# Patient Record
Sex: Female | Born: 1945
Health system: Southern US, Community
[De-identification: ages and names within clinical notes are randomized; demographics above are authoritative.]

## PROBLEM LIST (undated history)

## (undated) DIAGNOSIS — H269 Unspecified cataract: Secondary | ICD-10-CM

## (undated) DIAGNOSIS — Z872 Personal history of diseases of the skin and subcutaneous tissue: Secondary | ICD-10-CM

## (undated) DIAGNOSIS — M1990 Unspecified osteoarthritis, unspecified site: Secondary | ICD-10-CM

## (undated) DIAGNOSIS — Z201 Contact with and (suspected) exposure to tuberculosis: Secondary | ICD-10-CM

## (undated) DIAGNOSIS — G709 Myoneural disorder, unspecified: Secondary | ICD-10-CM

## (undated) DIAGNOSIS — K219 Gastro-esophageal reflux disease without esophagitis: Secondary | ICD-10-CM

## (undated) DIAGNOSIS — T7840XA Allergy, unspecified, initial encounter: Secondary | ICD-10-CM

## (undated) DIAGNOSIS — D649 Anemia, unspecified: Secondary | ICD-10-CM

## (undated) DIAGNOSIS — I10 Essential (primary) hypertension: Secondary | ICD-10-CM

## (undated) DIAGNOSIS — I499 Cardiac arrhythmia, unspecified: Secondary | ICD-10-CM

## (undated) HISTORY — PX: TUBAL LIGATION: SHX77

## (undated) HISTORY — PX: KELOID EXCISION: SHX1856

## (undated) HISTORY — PX: SHOULDER OPEN ROTATOR CUFF REPAIR: SHX2407

## (undated) HISTORY — DX: Contact with and (suspected) exposure to tuberculosis: Z20.1

## (undated) HISTORY — DX: Allergy, unspecified, initial encounter: T78.40XA

## (undated) HISTORY — PX: COLONOSCOPY W/ POLYPECTOMY: SHX1380

## (undated) HISTORY — PX: ABDOMINAL HYSTERECTOMY: SHX81

## (undated) HISTORY — PX: COLONOSCOPY: SHX174

## (undated) HISTORY — PX: KNEE ARTHROSCOPY: SUR90

## (undated) HISTORY — DX: Unspecified cataract: H26.9

## (undated) HISTORY — PX: KNEE SURGERY: SHX244

## (undated) HISTORY — PX: POLYPECTOMY: SHX149

## (undated) HISTORY — PX: APPENDECTOMY: SHX54

---

## 1998-02-26 ENCOUNTER — Other Ambulatory Visit: Admission: RE | Admit: 1998-02-26 | Discharge: 1998-02-26 | Payer: Self-pay | Admitting: Gastroenterology

## 1998-04-07 ENCOUNTER — Ambulatory Visit (HOSPITAL_COMMUNITY): Admission: RE | Admit: 1998-04-07 | Discharge: 1998-04-07 | Payer: Self-pay | Admitting: Gastroenterology

## 1998-04-07 ENCOUNTER — Encounter: Payer: Self-pay | Admitting: Gastroenterology

## 1999-03-17 ENCOUNTER — Encounter: Payer: Self-pay | Admitting: Gynecology

## 1999-03-17 ENCOUNTER — Encounter: Admission: RE | Admit: 1999-03-17 | Discharge: 1999-03-17 | Payer: Self-pay | Admitting: Gynecology

## 1999-04-17 ENCOUNTER — Other Ambulatory Visit: Admission: RE | Admit: 1999-04-17 | Discharge: 1999-04-17 | Payer: Self-pay | Admitting: Gynecology

## 2000-06-09 ENCOUNTER — Other Ambulatory Visit: Admission: RE | Admit: 2000-06-09 | Discharge: 2000-06-09 | Payer: Self-pay | Admitting: Gynecology

## 2000-06-13 ENCOUNTER — Encounter: Payer: Self-pay | Admitting: Gynecology

## 2000-06-13 ENCOUNTER — Encounter: Admission: RE | Admit: 2000-06-13 | Discharge: 2000-06-13 | Payer: Self-pay | Admitting: Gynecology

## 2000-07-07 ENCOUNTER — Emergency Department (HOSPITAL_COMMUNITY): Admission: EM | Admit: 2000-07-07 | Discharge: 2000-07-07 | Payer: Self-pay | Admitting: Emergency Medicine

## 2000-07-07 ENCOUNTER — Encounter: Payer: Self-pay | Admitting: Internal Medicine

## 2001-04-08 ENCOUNTER — Encounter: Payer: Self-pay | Admitting: Emergency Medicine

## 2001-04-08 ENCOUNTER — Emergency Department (HOSPITAL_COMMUNITY): Admission: EM | Admit: 2001-04-08 | Discharge: 2001-04-08 | Payer: Self-pay | Admitting: Emergency Medicine

## 2001-08-09 ENCOUNTER — Other Ambulatory Visit: Admission: RE | Admit: 2001-08-09 | Discharge: 2001-08-09 | Payer: Self-pay | Admitting: Gynecology

## 2001-10-05 ENCOUNTER — Emergency Department (HOSPITAL_COMMUNITY): Admission: EM | Admit: 2001-10-05 | Discharge: 2001-10-05 | Payer: Self-pay | Admitting: Emergency Medicine

## 2003-09-05 ENCOUNTER — Other Ambulatory Visit: Admission: RE | Admit: 2003-09-05 | Discharge: 2003-09-05 | Payer: Self-pay | Admitting: Gynecology

## 2003-10-11 ENCOUNTER — Encounter: Admission: RE | Admit: 2003-10-11 | Discharge: 2003-10-11 | Payer: Self-pay | Admitting: Gynecology

## 2004-09-21 ENCOUNTER — Ambulatory Visit: Payer: Self-pay | Admitting: Internal Medicine

## 2004-10-06 ENCOUNTER — Ambulatory Visit: Payer: Self-pay | Admitting: Internal Medicine

## 2004-10-23 ENCOUNTER — Ambulatory Visit: Payer: Self-pay | Admitting: Internal Medicine

## 2006-10-14 ENCOUNTER — Emergency Department (HOSPITAL_COMMUNITY): Admission: EM | Admit: 2006-10-14 | Discharge: 2006-10-14 | Payer: Self-pay | Admitting: Family Medicine

## 2006-10-19 ENCOUNTER — Ambulatory Visit: Payer: Self-pay | Admitting: Internal Medicine

## 2006-10-19 LAB — CONVERTED CEMR LAB
BUN: 9 mg/dL (ref 6–23)
CO2: 33 meq/L — ABNORMAL HIGH (ref 19–32)
Calcium: 10 mg/dL (ref 8.4–10.5)
Chloride: 101 meq/L (ref 96–112)
GFR calc non Af Amer: 108 mL/min
Glucose, Bld: 96 mg/dL (ref 70–99)
Potassium: 3.7 meq/L (ref 3.5–5.1)

## 2007-02-13 ENCOUNTER — Emergency Department (HOSPITAL_COMMUNITY): Admission: EM | Admit: 2007-02-13 | Discharge: 2007-02-13 | Payer: Self-pay | Admitting: Emergency Medicine

## 2007-02-14 ENCOUNTER — Telehealth: Payer: Self-pay | Admitting: Internal Medicine

## 2007-02-14 DIAGNOSIS — M25469 Effusion, unspecified knee: Secondary | ICD-10-CM

## 2007-03-10 ENCOUNTER — Emergency Department (HOSPITAL_COMMUNITY): Admission: EM | Admit: 2007-03-10 | Discharge: 2007-03-10 | Payer: Self-pay | Admitting: Emergency Medicine

## 2007-03-10 ENCOUNTER — Ambulatory Visit: Payer: Self-pay | Admitting: Surgery

## 2007-03-15 ENCOUNTER — Ambulatory Visit: Payer: Self-pay | Admitting: Internal Medicine

## 2007-03-15 DIAGNOSIS — I10 Essential (primary) hypertension: Secondary | ICD-10-CM

## 2007-04-13 ENCOUNTER — Encounter: Payer: Self-pay | Admitting: Internal Medicine

## 2008-01-26 ENCOUNTER — Emergency Department (HOSPITAL_COMMUNITY): Admission: EM | Admit: 2008-01-26 | Discharge: 2008-01-26 | Payer: Self-pay | Admitting: Emergency Medicine

## 2008-04-16 ENCOUNTER — Telehealth: Payer: Self-pay | Admitting: Internal Medicine

## 2008-06-18 ENCOUNTER — Telehealth: Payer: Self-pay | Admitting: Internal Medicine

## 2008-10-24 ENCOUNTER — Telehealth: Payer: Self-pay | Admitting: Internal Medicine

## 2009-09-13 ENCOUNTER — Emergency Department (HOSPITAL_COMMUNITY): Admission: EM | Admit: 2009-09-13 | Discharge: 2009-09-14 | Payer: Self-pay | Admitting: Emergency Medicine

## 2009-09-17 ENCOUNTER — Ambulatory Visit: Payer: Self-pay | Admitting: Internal Medicine

## 2009-09-17 LAB — CONVERTED CEMR LAB
AST: 17 units/L (ref 0–37)
Albumin: 3.7 g/dL (ref 3.5–5.2)
Basophils Relative: 0.4 % (ref 0.0–3.0)
CO2: 28 meq/L (ref 19–32)
Calcium: 9 mg/dL (ref 8.4–10.5)
Cholesterol: 184 mg/dL (ref 0–200)
Creatinine, Ser: 0.6 mg/dL (ref 0.4–1.2)
Eosinophils Relative: 2.1 % (ref 0.0–5.0)
Ketones, ur: NEGATIVE mg/dL
Lymphocytes Relative: 46.2 % — ABNORMAL HIGH (ref 12.0–46.0)
MCHC: 32.8 g/dL (ref 30.0–36.0)
MCV: 74.5 fL — ABNORMAL LOW (ref 78.0–100.0)
Neutrophils Relative %: 46.1 % (ref 43.0–77.0)
Nitrite: NEGATIVE
Platelets: 255 10*3/uL (ref 150.0–400.0)
RBC: 5.26 M/uL — ABNORMAL HIGH (ref 3.87–5.11)
RDW: 14.6 % (ref 11.5–14.6)
Sodium: 138 meq/L (ref 135–145)
TSH: 1.87 microintl units/mL (ref 0.35–5.50)
Total Bilirubin: 0.7 mg/dL (ref 0.3–1.2)
Total Protein, Urine: NEGATIVE mg/dL
Urobilinogen, UA: 0.2 (ref 0.0–1.0)
pH: 7 (ref 5.0–8.0)

## 2009-09-23 ENCOUNTER — Ambulatory Visit: Payer: Self-pay | Admitting: Internal Medicine

## 2009-09-23 DIAGNOSIS — Z8679 Personal history of other diseases of the circulatory system: Secondary | ICD-10-CM | POA: Insufficient documentation

## 2009-09-23 LAB — CONVERTED CEMR LAB: ds DNA Ab: <1 (ref <30)

## 2009-09-25 ENCOUNTER — Telehealth (INDEPENDENT_AMBULATORY_CARE_PROVIDER_SITE_OTHER): Payer: Self-pay | Admitting: *Deleted

## 2009-09-25 ENCOUNTER — Encounter: Payer: Self-pay | Admitting: Internal Medicine

## 2009-09-27 ENCOUNTER — Encounter: Payer: Self-pay | Admitting: Internal Medicine

## 2010-04-09 NOTE — Letter (Signed)
   [facility] [address] [place], [place]  [address] Phone: [phone]      September 27, 2009   MAKYNZIE DOBESH [address] [place], [place] [address]  RE:  LAB RESULTS  Dear  Ms. Kiernan,  The following is an interpretation of your most recent lab tests.  Please take note of any instructions provided or changes to medications that have resulted from your lab work.   Labs for lupus are negative for any active disease.    Sincerely Yours,    Jacques Navy MD  Patient: ALISON KUBICKI Note: All result statuses are Final unless otherwise noted.  Tests: (1) Anti Nuclear Antibody (ANA) Reflex (23900)  Anti Nuclear Antibody (ANA)                        [A]  POS                         NEGATIVE  Tests: (2) ANA Titer and Pattern ([address])   ANA Titer                 NEG                         <1:40           Reference Ranges:     1:40 - 1:80 Weakly positive, usually not clinically significant.     > or = to 1:160 Result may be clinically significant.     Due to differences in methodologies, results may differ between the     ANA screen and the Reflex IFA titer and pattern.   ANA Pattern               TNP           Pattern not applicable due to a negative titer result.  Tests: (3) Anti DNA, Native Dble Strand (13086)  Anti DNA, Native Dble Strand                             <1 IU/mL                    <30                  <  30 IU/mL     Negative                  30-40 IU/mL     Equivocal                  >  40 IU/mL     Positive

## 2010-04-09 NOTE — Letter (Signed)
Summary: Generic Letter  [facility]  [address] [place], [place] [address]   Phone: [phone]  Fax: [phone]    09/25/2009  Carolyn Fowler [address] [place], [place]  [address]  To Whom it may concern,     Ms. Kepple had a physical examination here in my office on September 23, 2009. I see no reason that she cannot participate in this program. All examinations checked out well with her physical, therefore it is ok for her to use the treadmill and other such equiptment to excercise. If you have any questions do not hesitate to call my office.        Sincerely,   Illene Regulus, MD

## 2010-04-09 NOTE — Progress Notes (Signed)
  Phone Note Call from Patient Call back at Home Phone [phone]   Caller: Patient Reason for Call: Talk to Doctor Summary of Call: Trying to participate in a weight loss program. Req md to write a note stating that she had her CPX 09/23/09 and everything was ok, and its ok for her to use treadmills ect. to exercise.  Initial call taken by: Orlan Leavens,  September 25, 2009 9:17 AM  Follow-up for Phone Call        if you prepare I will sign!!! THANKS Follow-up by: Jacques Navy MD,  September 25, 2009 9:57 AM  Additional Follow-up for Phone Call Additional follow up Details #1::        letter done and put up front, pt informed Additional Follow-up by: Ami Bullins CMA,  September 25, 2009 1:32 PM

## 2010-04-09 NOTE — Assessment & Plan Note (Signed)
Summary: PHYSICAL--STC   Vital Signs:  Patient profile:   65 year old female Height:      64 inches Weight:      257 pounds BMI:     44.27 O2 Sat:      97 % on Room air Temp:     97.9 degrees F oral Pulse rate:   78 / minute BP sitting:   150 / 98  (left arm) Cuff size:   large  Vitals Entered By: Bill Salinas CMA (September 23, 2009 1:43 PM)  O2 Flow:  Room air CC: CPX/ ab Comments Pt is due for Mammogram, and pap smear as well as Annual Eye exam   Primary Care Provider:  Norins  CC:  CPX/ ab.  History of Present Illness: Patient for routine medical follow-up.  1. She has regained all the weight she lost 2. She was at [facility] July 9th for palpitations. Labs included CBC-nl, Cardiac markers - nl, CMet  normal except for K = 3., D-dimer 0.61; CXR normal, CT brain normal, CT-angio chest -negative for PE or other abnormality. Her "fluttering stopped after K replacement.  3. She will have burning pain medial aspect of right heel/foot. 4. Concerned about lupus - 1 dtr with severe lupius, 1 dtr - with one "episode" of lupus, 1 grandchild with lupus, a second grandchild with lupus being "spotted." She will have splotchy rash on her face that is transient; bumps in her mouth.  5. health maintenance - due to see Dr. Nicholas Lose  Preventive Screening-Counseling & Management  Alcohol-Tobacco     Alcohol drinks/day: 0     Smoking Status: never  Current Medications (verified): 1)  Toprol Xl 100 Mg  Tb24 (Metoprolol Succinate) .... Take 1 Tablet By Mouth Once A Day 2)  Hydrochlorothiazide 25 Mg  Tabs (Hydrochlorothiazide) .... Take 1 Tablet By Mouth Once A Day 3)  Multivitamins   Tabs (Multiple Vitamin) .... Take 1 Tablet By Mouth Once A Day 4)  Vitamin E 400 Unit  Caps (Vitamin E) .... Take 1 Tablet By Mouth Once A Day 5)  Aleve 220 Mg  Tabs (Naproxen Sodium) .... As Needed 6)  Micardis 80 Mg Tabs (Telmisartan) .Marland Kitchen.. 1 By Mouth Once Daily 7)  Aspirin 81 Mg Tbec (Aspirin) .Marland Kitchen.. 1 Tab Two Times A  Day  Allergies (verified): No Known Drug Allergies  Past History:  Past Medical History: UCD UNSPECIFIED ESSENTIAL HYPERTENSION (ICD-401.9) JOINT EFFUSION, KNEE (ICD-719.06)  Past Surgical History: Tubal ligation Hysterectomy-secondary to fibroids Keloids removed left knee arthroscopy Rotator cuff repair - left   Family History: Father - deceased @60 : CAD/MI, PAD,  Mother - deceased @ 40's: Rheumatoid arthritis Neg- breast cancer, colon cancer; DM  Social History: Adopted Marriage -Master's degree vocational ed.  Married - '66- '79/divorced; married '04 2 sons - '67, '74; 3 daughters - '71, '74, '74; 15 grandchilderns; 3 great-grandchildren.Smoking Status:  never  Review of Systems       The patient complains of weight loss.  The patient denies anorexia, fever, weight gain, vision loss, decreased hearing, chest pain, dyspnea on exertion, peripheral edema, abdominal pain, severe indigestion/heartburn, muscle weakness, depression, and angioedema.    Physical Exam  General:  obese AA female in no distress Head:  Normocephalic and atraumatic without obvious abnormalities. No apparent alopecia or balding. Eyes:  vision grossly intact, pupils equal, pupils round, no injection, no optic disk abnormalities, and no retinal abnormalitiies.   Ears:  External ear exam shows no significant lesions or deformities.  Otoscopic examination reveals clear canals, tympanic membranes are intact bilaterally without bulging, retraction, inflammation or discharge. Hearing is grossly normal bilaterally. Nose:  no external deformity and no external erythema.   Mouth:  complex dental repair/parital denture, no buccal lesions, throat clear Neck:  supple, full ROM, no masses, no thyromegaly, and no carotid bruits.   Chest Wall:  No deformities, masses, or tenderness noted. Breasts:  deferred to gyn Lungs:  Normal respiratory effort, chest expands symmetrically. Lungs are clear to auscultation, no  crackles or wheezes. Heart:  Normal rate and regular rhythm. S1 and S2 normal without gallop, murmur, click, rub or other extra sounds. Abdomen:  Obese, soft, non-tender, normal bowel sounds, no masses, no guarding, and no inguinal hernia.   Msk:  normal ROM, no joint tenderness, no joint swelling, no joint warmth, no redness over joints, and no joint deformities.   Pulses:  2+ radial and DP pulses Extremities:  trace left pedal edema.   Neurologic:  alert & oriented X3, cranial nerves II-XII intact, strength normal in all extremities, sensation intact to light touch, gait normal, and DTRs symmetrical and normal.   Skin:  turgor normal, color normal, and no ulcerations.  No visible facial rash today Cervical Nodes:  no anterior cervical adenopathy and no posterior cervical adenopathy.   Psych:  Oriented X3, memory intact for recent and remote, normally interactive, and good eye contact.     Impression & Recommendations:  Problem # 1:  FACIAL RASH (ICD-782.1) Strong family h/o SLE. Patient has had a transient facila rash. She has a high level of concern as to whether she may have lupus.  Plan - ANA, dsDNA  Orders: T-Antinuclear Antib (ANA) [phone]) T-dsDNA Assay [phone])  Problem # 2:  UNSPECIFIED ESSENTIAL HYPERTENSION (ICD-401.9)  Her updated medication list for this problem includes:    Metoprolol Tartrate 50 Mg Tabs (Metoprolol tartrate) .Marland Kitchen... 1 by mouth two times a day    Hydrochlorothiazide 25 Mg Tabs (Hydrochlorothiazide) .Marland Kitchen... Take 1 tablet by mouth once a day    Losartan Potassium 50 Mg Tabs (Losartan potassium) .Marland Kitchen... 1 by mouth once daily for blood pressure  BP today: 150/98 Prior BP: 169/95 (03/15/2007)  Suboptimal control.     Plan -  renewed all hermedications after making generic substitutions.             Patient to check BPO at home and to return in 4-6 weeks for BP check                   Problem # 3:  PALPITATIONS, HX OF (ICD-V12.50) Assessment:  Unchanged ED records reviewed. Patient reports that he symptoms resolved  with K+ replacement. Her evaluation was complete and no further testing indecated at this time.   Problem # 4:  Preventive Health Care (ICD-V70.0) History as noted. Her exam was remarkable for her obesity but otherwise normal. Gyn and breasat exam deferred to gynecology. Her lab resultgs are within normal limits. She is current with mammography by her report. Will need to pull paper chart to check on last colonoscopy. Brought up-to-date for tetnus. She is a candidate for pneumonia vaccine adn zostavax.   In summary - a very nice woman. We discussed a plan for weight management: smart food choices, portion size control and regular exercise. Her target is to loose 50 lbs, her goal is to loose 1 lb/month = 12 lbs/year.  She will need to return in 4-6 weeks for BP check.   Complete Medication List:  1)  Metoprolol Tartrate 50 Mg Tabs (Metoprolol tartrate) .Marland Kitchen.. 1 by mouth two times a day 2)  Hydrochlorothiazide 25 Mg Tabs (Hydrochlorothiazide) .... Take 1 tablet by mouth once a day 3)  Multivitamins Tabs (Multiple vitamin) .... Take 1 tablet by mouth once a day 4)  Vitamin E 400 Unit Caps (Vitamin e) .... Take 1 tablet by mouth once a day 5)  Aleve 220 Mg Tabs (Naproxen sodium) .... As needed 6)  Losartan Potassium 50 Mg Tabs (Losartan potassium) .Marland Kitchen.. 1 by mouth once daily for blood pressure 7)  Aspirin 81 Mg Tbec (Aspirin) .Marland Kitchen.. 1 tab two times a day  Other Orders: Tdap => 87yrs IM ([address]) Admin 1st Vaccine ([address])  Patient: Carolyn Fowler Note: All result statuses are Final unless otherwise noted.  Tests: (1) BMP (METABOL)   Sodium                    138 mEq/L                   135-145   Potassium                 4.1 mEq/L                   3.5-5.1   Chloride                  104 mEq/L                   96-112   Carbon Dioxide            28 mEq/L                    19-32   Glucose                   93 mg/dL                     30-86   BUN                       16 mg/dL                    5-78   Creatinine                0.6 mg/dL                   4.6-9.6   Calcium                   9.0 mg/dL                   2.9-52.8   GFR                       127.39 mL/min               >60  Tests: (2) Lipid Panel (LIPID)   Cholesterol               184 mg/dL                   4-132     ATP III Classification            Desirable:  < 200 mg/dL  Borderline High:  200 - 239 mg/dL               High:  > = 240 mg/dL   Triglycerides             106.0 mg/dL                 9.1-478.2     Normal:  <150 mg/dL     Borderline High:  956 - 199 mg/dL   HDL                       21.30 mg/dL                 >86.57   VLDL Cholesterol          21.2 mg/dL                  8.4-69.6   LDL Cholesterol      [H]  295 mg/dL                   2-84  CHO/HDL Ratio:  CHD Risk                             4                    Men          Women     1/2 Average Risk     3.4          3.3     Average Risk          5.0          4.4     2X Average Risk          9.6          7.1     3X Average Risk          15.0          11.0                           Tests: (3) CBC Platelet w/Diff (CBCD)   White Cell Count          7.7 K/uL                    4.5-10.5   Red Cell Count       [H]  5.26 Mil/uL                 3.87-5.11   Hemoglobin                12.8 g/dL                   13.2-44.0   Hematocrit                39.2 %                      36.0-46.0   MCV                  [L]  74.5 fl                     78.0-100.0   MCHC  32.8 g/dL                   16.1-09.6   RDW                       14.6 %                      11.5-14.6   Platelet Count            255.0 K/uL                  150.0-400.0   Neutrophil %              46.1 %                      43.0-77.0   Lymphocyte %         [H]  46.2 %                      12.0-46.0   Monocyte %                5.2 %                       3.0-12.0   Eosinophils%               2.1 %                       0.0-5.0   Basophils %               0.4 %                       0.0-3.0   Neutrophill Absolute      3.6 K/uL                    1.4-7.7   Lymphocyte Absolute       3.6 K/uL                    0.7-4.0   Monocyte Absolute         0.4 K/uL                    0.1-1.0  Eosinophils, Absolute                             0.2 K/uL                    0.0-0.7   Basophils Absolute        0.0 K/uL                    0.0-0.1  Tests: (4) Hepatic/Liver Function Panel (HEPATIC)   Total Bilirubin           0.7 mg/dL                   0.4-5.4   Direct Bilirubin          0.1 mg/dL                   0.9-8.1   Alkaline Phosphatase      68 U/L  39-117   AST                       17 U/L                      0-37   ALT                       17 U/L                      0-35   Total Protein             6.6 g/dL                    1.6-1.0   Albumin                   3.7 g/dL                    9.6-0.4  Tests: (5) TSH (TSH)   FastTSH                   1.87 uIU/mL                 0.35-5.50  Tests: (6) UDip w/Micro (URINE)   Color                     LT. YELLOW       RANGE:  Yellow;Lt. Yellow     small amorphous present   Clarity                   CLEAR                       Clear   Specific Gravity          1.020                       1.000 - 1.030   Urine Ph                  7.0                         5.0-8.0   Protein                   NEGATIVE                    Negative   Urine Glucose             NEGATIVE                    Negative   Ketones                   NEGATIVE                    Negative   Urine Bilirubin           NEGATIVE                    Negative   Blood                     NEGATIVE  Negative   Urobilinogen              0.2                         0.0 - 1.0   Leukocyte Esterace        SMALL                       Negative   Nitrite                   NEGATIVE                    Negative   Urine WBC                  3-6/hpf                     0-2/hpf   Urine Epith               Few(5-10/hpf)               Rare(0-4/hpf)   Urine Bacteria            Few(10-50/hpf)              NonePrescriptions: LOSARTAN POTASSIUM 50 MG TABS (LOSARTAN POTASSIUM) 1 by mouth once daily for blood pressure  #90 x 3   Entered and Authorized by:   Jacques Navy MD   Signed by:   Jacques Navy MD on 09/23/2009   Method used:   Electronically to        [facility] [phone]* (retail)       [address]       [place], [place]  [address]       Ph: [phone]       Fax: [phone]   RxID:   [id number] METOPROLOL TARTRATE 50 MG TABS (METOPROLOL TARTRATE) 1 by mouth two times a day  #180 x 3   Entered and Authorized by:   Jacques Navy MD   Signed by:   Jacques Navy MD on 09/23/2009   Method used:   Electronically to        [facility] [phone]* (retail)       [address]       [place], [place]  [address]       Ph: [phone]       Fax: [phone]   RxID:   [id number]    Immunizations Administered:  Tetanus Vaccine:    Vaccine Type: Tdap    Site: right deltoid    Mfr: GlaxoSmithKline    Dose: 0.5 ml    Route: IM    Given by: Ami Bullins CMA    Exp. Date: 05/30/2011    Lot #: [id number]    VIS given: 01/24/07 version given September 23, 2009.

## 2010-05-24 LAB — POCT CARDIAC MARKERS: CKMB, poc: 2 ng/mL (ref 1.0–8.0)

## 2010-05-24 LAB — DIFFERENTIAL
Basophils Absolute: 0.1 10*3/uL (ref 0.0–0.1)
Eosinophils Relative: 2 % (ref 0–5)
Lymphocytes Relative: 36 % (ref 12–46)
Monocytes Relative: 8 % (ref 3–12)

## 2010-05-24 LAB — CBC
HCT: 40 % (ref 36.0–46.0)
Hemoglobin: 13.3 g/dL (ref 12.0–15.0)
MCH: 24.8 pg — ABNORMAL LOW (ref 26.0–34.0)
MCV: 74.4 fL — ABNORMAL LOW (ref 78.0–100.0)
Platelets: 234 10*3/uL (ref 150–400)
RDW: 13.3 % (ref 11.5–15.5)

## 2010-05-24 LAB — TSH: TSH: 1.608 u[IU]/mL (ref 0.350–4.500)

## 2010-05-24 LAB — POCT I-STAT, CHEM 8
BUN: 16 mg/dL (ref 6–23)
Chloride: 103 mEq/L (ref 96–112)
Glucose, Bld: 103 mg/dL — ABNORMAL HIGH (ref 70–99)
HCT: 43 % (ref 36.0–46.0)

## 2010-06-29 ENCOUNTER — Other Ambulatory Visit: Payer: Self-pay | Admitting: Internal Medicine

## 2010-10-12 ENCOUNTER — Other Ambulatory Visit: Payer: Self-pay | Admitting: Internal Medicine

## 2010-12-17 ENCOUNTER — Other Ambulatory Visit: Payer: Self-pay | Admitting: Internal Medicine

## 2011-02-10 ENCOUNTER — Other Ambulatory Visit: Payer: Self-pay | Admitting: Internal Medicine

## 2011-03-04 ENCOUNTER — Other Ambulatory Visit: Payer: Self-pay | Admitting: *Deleted

## 2011-03-04 MED ORDER — HYDROCHLOROTHIAZIDE 25 MG PO TABS: 25.0000 mg | ORAL_TABLET | Freq: Every day | ORAL | Status: DC

## 2011-05-03 ENCOUNTER — Other Ambulatory Visit: Payer: Self-pay | Admitting: Gynecology

## 2011-05-03 DIAGNOSIS — N6320 Unspecified lump in the left breast, unspecified quadrant: Secondary | ICD-10-CM

## 2011-05-03 DIAGNOSIS — N644 Mastodynia: Secondary | ICD-10-CM

## 2011-05-11 ENCOUNTER — Ambulatory Visit
Admission: RE | Admit: 2011-05-11 | Discharge: 2011-05-11 | Disposition: A | Discharge status: Home / Self Care | Payer: Self-pay | Source: Ambulatory Visit | Attending: Gynecology | Admitting: Gynecology

## 2011-05-11 DIAGNOSIS — N6320 Unspecified lump in the left breast, unspecified quadrant: Secondary | ICD-10-CM

## 2011-05-11 DIAGNOSIS — N644 Mastodynia: Secondary | ICD-10-CM

## 2011-06-11 ENCOUNTER — Other Ambulatory Visit: Payer: Self-pay | Admitting: Orthopedic Surgery

## 2011-06-11 DIAGNOSIS — M4310 Spondylolisthesis, site unspecified: Secondary | ICD-10-CM

## 2011-06-18 ENCOUNTER — Ambulatory Visit
Admission: RE | Admit: 2011-06-18 | Discharge: 2011-06-18 | Disposition: A | Discharge status: Home / Self Care | Payer: BC Managed Care – PPO | Source: Ambulatory Visit | Attending: Orthopedic Surgery | Admitting: Orthopedic Surgery

## 2011-06-18 VITALS — BP 136/85 | HR 71

## 2011-06-18 DIAGNOSIS — M4310 Spondylolisthesis, site unspecified: Secondary | ICD-10-CM

## 2011-06-18 MED ORDER — DIAZEPAM 5 MG PO TABS
10.0000 mg | ORAL_TABLET | Freq: Once | ORAL | Status: AC
  Administered 2011-06-18: 10 mg via ORAL

## 2011-06-18 MED ORDER — IOHEXOL 180 MG/ML  SOLN
15.0000 mL | Freq: Once | INTRAMUSCULAR | Status: AC | PRN
  Administered 2011-06-18: 15 mL via INTRATHECAL

## 2011-06-18 NOTE — Discharge Instructions (Signed)

## 2011-07-01 ENCOUNTER — Encounter: Payer: Self-pay | Admitting: Internal Medicine

## 2011-07-01 ENCOUNTER — Other Ambulatory Visit (INDEPENDENT_AMBULATORY_CARE_PROVIDER_SITE_OTHER): Payer: BC Managed Care – PPO

## 2011-07-01 ENCOUNTER — Ambulatory Visit (INDEPENDENT_AMBULATORY_CARE_PROVIDER_SITE_OTHER): Payer: BC Managed Care – PPO | Admitting: Internal Medicine

## 2011-07-01 VITALS — BP 140/100 | HR 78 | Temp 98.2°F | Resp 16 | Wt 251.0 lb

## 2011-07-01 DIAGNOSIS — I10 Essential (primary) hypertension: Secondary | ICD-10-CM

## 2011-07-01 DIAGNOSIS — M5431 Sciatica, right side: Secondary | ICD-10-CM

## 2011-07-01 DIAGNOSIS — M5430 Sciatica, unspecified side: Secondary | ICD-10-CM

## 2011-07-01 DIAGNOSIS — Z0000 Encounter for general adult medical examination without abnormal findings: Secondary | ICD-10-CM

## 2011-07-01 DIAGNOSIS — Z23 Encounter for immunization: Secondary | ICD-10-CM

## 2011-07-01 LAB — COMPREHENSIVE METABOLIC PANEL
ALT: 18 U/L (ref 0–35)
BUN: 13 mg/dL (ref 6–23)
CO2: 29 mEq/L (ref 19–32)
Creatinine, Ser: 0.7 mg/dL (ref 0.4–1.2)
GFR: 117.72 mL/min (ref >60.00)
Glucose, Bld: 97 mg/dL (ref 70–99)
Total Bilirubin: 0.7 mg/dL (ref 0.3–1.2)

## 2011-07-01 MED ORDER — HYDROCHLOROTHIAZIDE 25 MG PO TABS: 25.0000 mg | ORAL_TABLET | Freq: Every day | ORAL | Status: DC

## 2011-07-01 MED ORDER — LOSARTAN POTASSIUM 100 MG PO TABS: 100.0000 mg | ORAL_TABLET | Freq: Every day | ORAL | Status: DC

## 2011-07-01 MED ORDER — METOPROLOL TARTRATE 50 MG PO TABS: 50.0000 mg | ORAL_TABLET | Freq: Two times a day (BID) | ORAL | Status: DC

## 2011-07-01 NOTE — Patient Instructions (Signed)
Will follow with you on the back problem.  Have Dr. Jamey Ripa send me a copy of his evaluation.  We gave you pneumonia vaccine today. Please check with your insurance company about coverage for shingles vaccine.  When you can you need to have an eye exam and to see Dr. Jarold Motto for colonoscopy

## 2011-07-01 NOTE — Progress Notes (Signed)
Subjective:    Patient ID: Carolyn Fowler, female    DOB: Apr 18, 1945, 66 y.o.   MRN: [id number]  HPI Carolyn Fowler presents for an annual exam. She has been seen by Dr. Nicholas Lose - normal pelvic and pap smear. She did have a left anterior axillary mass on exam. Mammography with u/s follow-up was negative for any abnormality on the imaging studies. Dr. Nicholas Lose has referred her to Dr. Jamey Ripa for surgical evaluation.  She has been having bilateral  Sciatica but worst on the right . She is seeing Dr. [name] - she had an abnormal MRI - study not accessible. She did have CT myelogram 06/18/11 with anterolisthesis L4-5, L5-S1; she has foraminal narrowing with compromise of the L5, S1 nerve roots bilaterally R>L and also DDD with foraminal narrowing at L4. She will be returning to Dr. [name] to review her study next week. She did have a course of prednisone which gave some relief but pain came back full force. She is not taking any analgesics and does take occasional NSAID.  BP - she has been taking losartan, metoprolol and HCTZ. BP today is 140/100  She has otherwise been OK.  No past medical history on file. Past Surgical History  Procedure Date  . Abdominal hysterectomy    Family History  Problem Relation Age of Onset  . Adopted: Yes   History   Social History  . Marital Status: Married    Spouse Name: N/A    Number of Children: 5  . Years of Education: 18   Occupational History  . vocational counselor     retired   Social History Main Topics  . Smoking status: Never Smoker   . Smokeless tobacco: Never Used  . Alcohol Use: No  . Drug Use: No  . Sexually Active: No   Other Topics Concern  . Not on file   Social History Narrative   [employer], [employer] BA, Master's degree vocational ed. Married - '77- '79/divorced; married '042 sons - '67, '74; 3 daughters - '71, '74, '74; 15 grandchilderns; 3 great-grandchildren. Work - retired from Radiation protection practitioner. Marriage in good health.      Current Outpatient Prescriptions on File Prior to Visit  Medication Sig Dispense Refill  . DISCONTD: hydrochlorothiazide (HYDRODIURIL) 25 MG tablet Take 1 tablet (25 mg total) by mouth daily.  90 tablet  2  . DISCONTD: losartan (COZAAR) 50 MG tablet TAKE ONE TABLET BY MOUTH EVERY DAY FOR BLOOD PRESSURE  90 tablet  3  . DISCONTD: metoprolol (LOPRESSOR) 50 MG tablet TAKE ONE TABLET BY MOUTH TWICE DAILY   60 tablet  2      Review of Systems Constitutional:  Negative for fever, chills, activity change and unexpected weight change.  HEENT:  Negative for hearing loss, ear pain, congestion, neck stiffness and postnasal drip. Negative for sore throat or swallowing problems. Negative for dental complaints.   Eyes: Negative for vision loss or change in visual acuity.  Respiratory: Negative for chest tightness and wheezing. Negative for DOE.   Cardiovascular: Negative for chest pain or palpitations. No decreased exercise tolerance Gastrointestinal: No change in bowel habit. No bloating or gas. No reflux or indigestion Genitourinary: Negative for urgency, frequency, flank pain and difficulty urinating.  Musculoskeletal: Negative for myalgias, back pain, arthralgias and gait problem.  Neurological: Negative for dizziness, tremors, weakness and headaches.  Hematological: Negative for adenopathy.  Psychiatric/Behavioral: Negative for behavioral problems and dysphoric mood.       Objective:   Physical Exam Filed  Vitals:   07/01/11 1439  BP: 140/100  Pulse: 78  Temp: 98.2 F (36.8 C)  Resp: 16   Wt Readings from Last 3 Encounters:  07/01/11 251 lb (113.853 kg)  09/23/09 257 lb (116.574 kg)   Gen'l- obese AA woman in no distress HEENT- [place]/AT, C&S clear, PERRLA Neck- supple, no thyromegaly Nodes - negative Cor- 2+ radial, DP pulses, No JVD, no carotid bruits, RRR w/o murmur Pulm - normal respirations, lungs CTAP Breast exam - deferred to gyn Abd- obese, soft Pelvic/rectal deferred to  gyn Ext- no deformity, normal ROM small, medium and large joints Neuro - A&O x e, CN II-XII normal, MS normal, cerebellar function - normal Derm- no lesions face, neck, UE.  Lab Results  Component Value Date   WBC 7.7 09/17/2009   HGB 12.8 09/17/2009   HCT 39.2 09/17/2009   PLT 255.0 09/17/2009   GLUCOSE 97 07/01/2011   CHOL 184 09/17/2009   TRIG 106.0 09/17/2009   HDL 49.40 09/17/2009   LDLCALC 113* 09/17/2009   ALT 18 07/01/2011   AST 17 07/01/2011   NA 140 07/01/2011   K 3.5 07/01/2011   CL 101 07/01/2011   CREATININE 0.7 07/01/2011   BUN 13 07/01/2011   CO2 29 07/01/2011   TSH 1.87 09/17/2009          Assessment & Plan:

## 2011-07-05 ENCOUNTER — Encounter: Payer: Self-pay | Admitting: Internal Medicine

## 2011-07-05 DIAGNOSIS — Z0000 Encounter for general adult medical examination without abnormal findings: Secondary | ICD-10-CM | POA: Insufficient documentation

## 2011-07-05 DIAGNOSIS — M5432 Sciatica, left side: Secondary | ICD-10-CM | POA: Insufficient documentation

## 2011-07-05 NOTE — Assessment & Plan Note (Signed)
Carolyn Fowler is having considerable pain but is able to ambulate and manage her ADLs. She will be seeing Dr. [name] in the near future to review myelogram and plan next step in her treatment.

## 2011-07-05 NOTE — Assessment & Plan Note (Addendum)
BP Readings from Last 3 Encounters:  07/01/11 140/100  06/18/11 136/85  09/23/09 150/98   Mild elevation.   Plan- monitor at home. If systolic is consistently 140+ or diastolic 90+ will need to modify regimen.

## 2011-07-05 NOTE — Assessment & Plan Note (Signed)
Interval medical history is negative for any new problems. Physical, sans pelvic and breast exam, is normal but notable for obesity. She is current with Gyn. She is due for colonoscopy and will contact Dr. Jarold Motto at her convenience. She is current with mamography. Immunizations are current but she is due Shingles vaccine - she will check her insurance coverage.   In summary - a very nice woman who is facing intervention for DDD/DJD with probable next step being ESI. She is medically stable for surgical procedures and anesthesia if needed. She will return as needed on in 1 year.

## 2011-07-27 ENCOUNTER — Encounter (INDEPENDENT_AMBULATORY_CARE_PROVIDER_SITE_OTHER): Payer: Self-pay | Admitting: Surgery

## 2011-07-27 ENCOUNTER — Ambulatory Visit (INDEPENDENT_AMBULATORY_CARE_PROVIDER_SITE_OTHER): Payer: BC Managed Care – PPO | Admitting: Surgery

## 2011-07-27 VITALS — BP 146/92 | HR 84 | Temp 97.0°F | Resp 16 | Ht 63.5 in | Wt 252.0 lb

## 2011-07-27 DIAGNOSIS — N6019 Diffuse cystic mastopathy of unspecified breast: Secondary | ICD-10-CM | POA: Insufficient documentation

## 2011-07-27 NOTE — Patient Instructions (Signed)
See me again in three months for a breast re-eamination

## 2011-07-27 NOTE — Progress Notes (Signed)
  CC: Possible breast mass HPI: This patient comes over on referral from her gynecologist for evaluation of a left breast nodular density. The patient first noticed it this past fall so than presently six months. She has some pain. She's had a mammogram and ultrasound of the area several months ago there were negative for mass. However the area remained palpably abnormal and we're S. to see her. She's had no other prior breast problems or symptoms. She doesn't have known family history of breast cancer but she is adopted and has incomplete palate or her family history.   ROS: Her review of systems has been positive for palpitations and easy bruising.  MEDS: Current Outpatient Prescriptions  Medication Sig Dispense Refill  . hydrochlorothiazide (HYDRODIURIL) 25 MG tablet Take 1 tablet (25 mg total) by mouth daily.  90 tablet  3  . losartan (COZAAR) 100 MG tablet Take 1 tablet (100 mg total) by mouth daily.  90 tablet  3  . metoprolol (LOPRESSOR) 50 MG tablet Take 1 tablet (50 mg total) by mouth 2 (two) times daily.  180 tablet  3  . HYDROcodone-acetaminophen (NORCO) 5-325 MG per tablet Ad lib.         ALLERGIES:  Allergies  Allergen Reactions  . Penicillins Itching, Swelling and Rash    Itched inside and out     PE Vital signs:BP 146/92  Pulse 84  Temp(Src) 97 F (36.1 C) (Temporal)  Resp 16  Ht 5' 3.5" (1.613 m)  Wt 252 lb (114.306 kg)  BMI 43.94 kg/m2  Gen.: The patient alert awake oriented and healthy-appearing. Breasts: The breasts are very large and pendulous. The right breast is completely normal and nontender. The left breast is basically normal but with her lying supine and the breast retracted medially there is a palpable density in the upper outer quadrant towards the axilla about the one or 2:00 position. This area is tender. It is not really a discrete mass but feels more like some fibrotic or fibrocystic residual breast tissue.  Data Reviewed I have reviewed the  notes from her gynecologist.  Assessment Fibrocystic changes with some nodularity.  Plan We discussed alternatives of excisional biopsy versus followup I'm in favor of followup since I think this is going to be benign. She'll come back to see me in three months.

## 2011-10-07 HISTORY — PX: BACK SURGERY: SHX140

## 2011-10-13 ENCOUNTER — Encounter (HOSPITAL_COMMUNITY): Payer: Self-pay | Admitting: Pharmacy Technician

## 2011-10-18 ENCOUNTER — Other Ambulatory Visit: Payer: Self-pay | Admitting: Internal Medicine

## 2011-10-19 NOTE — H&P (Signed)
Carolyn Fowler DOB: [birth year]  Chief Complaint: back pain with radicular right leg pain  History of Present Illness The patient is a 66 year old female who comes in today for a preoperative History and Physical. The patient is scheduled for a central lumbar decompression with microdiscectomy L3-L4 with possible L4-L5 involvement. to be performed by Dr. Georges Lynch. Carolyn Hillock, MD at [facility] on Wednesday October 27, 2011 . She has been seen by Dr. Darrelyn Fowler for her back and leg pain for several months. CT myelogram showed she has a herniated disc at L3-4 and it's central. She has a stenosis of 3-4 adn L4-L5 as well. Carolyn Fowler had an ESI by Dr. Ethelene Hal. She had relief for about a day but about 48 hours after the injection, she started to have the right leg pain and cramps that she had previously. She reports that she gets severe cramps about 3 times a day but throughout the day she does have some constant pain, as well. No particular activity seems to bring this on. It can happen with sitting or standing. She does have some numbness and tingling in the right foot. She has noticed that, at times, due to the pain in her right leg and back, she is having some trouble with balance and walking. She needs to have a central decompressive lumbar laminectomy and microdiscectomy at L3-4. Risks and benefits of the surgery discussed.    Problem List/Past MedicalHistory Lumbar disc herniation (722.10) Spondylolisthesis, Acquired (738.4) Gastroesophageal Reflux Disease Hypertension Tuberculosis, Contact With/Exposure To. 1992   Allergies PENICILLIN.    Family History Patient is adopted and has limited knowledge of family medical history Heart Disease. father Rheumatoid Arthritis. mother Lupus Erythematosus, Systemic. Child. Brain Tumor. nephew   Social History Exercise. Exercises weekly Illicit drug use. no Marital status. married Current work status.  retired Tobacco use. never smoker Living situation. live with spouse Children. 5 or more Alcohol use. never consumed alcohol Post-Surgical Plans. home with family Advance Directives. none   Medication History Hydrochlorothiazide (25MG  Tablet, Oral daily) Active. Losartan Potassium (50MG  Tablet, Oral daily) Active. Metoprolol Tartrate (50MG  Tablet, Oral two times daily) Active. Multiple Vitamins/Womens (1 Oral daily) Active. Aspirin EC (81MG  Tablet DR, Oral two times daily) Active. Norco 5-325 MG PRN pain Q 4-6 hours    Past Surgical History Tubal Ligation Hysterectomy. partial (cancerous) Arthroscopy of Knee. left Colon Polyp Removal - Colonoscopy Rotator Cuff Repair - Left Appendectomy    Review of Systems General:Not Present- Chills, Fever, Night Sweats, Fatigue, Weight Gain, Weight Loss and Memory Loss. Skin:Not Present- Hives, Itching, Rash, Eczema and Lesions. HEENT:Present- Dentures. Not Present- Tinnitus, Headache, Double Vision, Visual Loss and Hearing Loss. Respiratory:Not Present- Shortness of breath with exertion, Shortness of breath at rest, Allergies, Coughing up blood and Chronic Cough. Cardiovascular:Present- Palpitations. Not Present- Chest Pain, Racing/skipping heartbeats, Difficulty Breathing Lying Down, Murmur and Swelling. Gastrointestinal:Not Present- Bloody Stool, Heartburn, Abdominal Pain, Vomiting, Nausea, Constipation, Diarrhea, Difficulty Swallowing, Jaundice and Loss of appetitie. Female Genitourinary:Not Present- Blood in Urine, Urinary frequency, Weak urinary stream, Discharge, Flank Pain, Incontinence, Painful Urination, Urgency, Urinary Retention and Urinating at Night. Musculoskeletal:Present- Muscle Weakness, Muscle Pain, Joint Swelling, Joint Pain, Back Pain, Morning Stiffness and Spasms. Neurological:Present- Difficulty with balance and Weakness. Not Present- Tremor, Dizziness, Blackout spells and  Paralysis. Psychiatric:Not Present- Insomnia.   Vitals Weight: 252 lb Height: 64 in Body Surface Area: 2.27 m Body Mass Index: 43.26 kg/m Pulse: 80 (Regular) Resp.: 17 (Unlabored) BP: 158/96 (Sitting, Left Arm, Standard) Patient  has been out of Losartan BP med for 2 days    Physical Exam General Mental Status - Alert, cooperative and good historian. General Appearance- pleasant. Not in acute distress. Orientation- Oriented X3. Build & Nutrition- Overweight, Well nourished and Well developed. Head and Neck Head- normocephalic, atraumatic . Neck Global Assessment- supple. no bruit auscultated on the right and no bruit auscultated on the left. Eye Pupil- Bilateral- Regular and Round. Motion- Bilateral- EOMI. Chest and Lung Exam Auscultation: Breath sounds:- clear at anterior chest wall and - clear at posterior chest wall. Adventitious sounds:- No Adventitious sounds. Cardiovascular Auscultation:Rhythm- Regular rate and rhythm. Heart Sounds- S1 WNL and S2 WNL. Murmurs & Other Heart Sounds:Auscultation of the heart reveals - No Murmurs. Abdomen Inspection:Contour- Obese. Palpation/Percussion:Tenderness- Abdomen is non-tender to palpation. Rigidity (guarding)- Abdomen is soft. Auscultation:Auscultation of the abdomen reveals - Bowel sounds normal. Female Genitourinary Not done, not pertinent to present illness Peripheral Vascular Upper Extremity: Palpation:- Pulses bilaterally normal. Lower Extremity: Palpation:- Pulses bilaterally normal. Neurologic Neurologic evaluation reveals - normal sensation and upper and lower extremity deep tendon reflexes intact bilaterally . Musculoskeletal No tenderness over the lumbar spine. She does have some tenderness over the SI joint on the right side. No CVA tenderness. No masses or tumors. She does have pain with hyperflexion and lateral flexion to the left and to the right. It  starts in the right side of the back and goes towards the right buttock, also some tightness down towards the right knee. On the left leg, she doesn't have any motor deficits. Strength is 5/5. She does have decreased strength in the right hip flexors as well as the right foot with a partial foot drop. Positive SLR on the right. Hips and knees have normal painless ROM.     Assessment & Plan Lumbar disc herniation (722.10) Central lumbar decompression with microdiscectomy L3-L4 with possible L4-L5 involvement       Dimitri Ped, PA-C

## 2011-10-21 ENCOUNTER — Encounter (HOSPITAL_COMMUNITY): Payer: Self-pay

## 2011-10-21 ENCOUNTER — Ambulatory Visit (HOSPITAL_COMMUNITY)
Admission: RE | Admit: 2011-10-21 | Discharge: 2011-10-21 | Disposition: A | Discharge status: Home / Self Care | Payer: Medicare Other | Source: Ambulatory Visit | Attending: Surgical | Admitting: Surgical

## 2011-10-21 ENCOUNTER — Encounter (HOSPITAL_COMMUNITY)
Admission: RE | Admit: 2011-10-21 | Discharge: 2011-10-21 | Disposition: A | Discharge status: Home / Self Care | Payer: Medicare Other | Source: Ambulatory Visit | Attending: Orthopedic Surgery | Admitting: Orthopedic Surgery

## 2011-10-21 DIAGNOSIS — M5137 Other intervertebral disc degeneration, lumbosacral region: Secondary | ICD-10-CM | POA: Insufficient documentation

## 2011-10-21 DIAGNOSIS — I77819 Aortic ectasia, unspecified site: Secondary | ICD-10-CM | POA: Insufficient documentation

## 2011-10-21 DIAGNOSIS — M51379 Other intervertebral disc degeneration, lumbosacral region without mention of lumbar back pain or lower extremity pain: Secondary | ICD-10-CM | POA: Insufficient documentation

## 2011-10-21 DIAGNOSIS — I10 Essential (primary) hypertension: Secondary | ICD-10-CM | POA: Insufficient documentation

## 2011-10-21 DIAGNOSIS — Q762 Congenital spondylolisthesis: Secondary | ICD-10-CM | POA: Insufficient documentation

## 2011-10-21 DIAGNOSIS — Z01812 Encounter for preprocedural laboratory examination: Secondary | ICD-10-CM | POA: Insufficient documentation

## 2011-10-21 DIAGNOSIS — I517 Cardiomegaly: Secondary | ICD-10-CM | POA: Insufficient documentation

## 2011-10-21 DIAGNOSIS — Z01818 Encounter for other preprocedural examination: Secondary | ICD-10-CM | POA: Insufficient documentation

## 2011-10-21 HISTORY — DX: Cardiac arrhythmia, unspecified: I49.9

## 2011-10-21 HISTORY — DX: Essential (primary) hypertension: I10

## 2011-10-21 HISTORY — DX: Anemia, unspecified: D64.9

## 2011-10-21 HISTORY — DX: Unspecified osteoarthritis, unspecified site: M19.90

## 2011-10-21 HISTORY — DX: Myoneural disorder, unspecified: G70.9

## 2011-10-21 HISTORY — DX: Gastro-esophageal reflux disease without esophagitis: K21.9

## 2011-10-21 HISTORY — DX: Personal history of diseases of the skin and subcutaneous tissue: Z87.2

## 2011-10-21 LAB — PROTIME-INR
INR: 0.96 (ref 0.00–1.49)
Prothrombin Time: 13 seconds (ref 11.6–15.2)

## 2011-10-21 LAB — URINALYSIS, ROUTINE W REFLEX MICROSCOPIC
Bilirubin Urine: NEGATIVE
Glucose, UA: NEGATIVE mg/dL
Hgb urine dipstick: NEGATIVE
Ketones, ur: NEGATIVE mg/dL
Nitrite: POSITIVE — AB
Protein, ur: NEGATIVE mg/dL
Specific Gravity, Urine: 1.016 (ref 1.005–1.030)
Urobilinogen, UA: 0.2 mg/dL (ref 0.0–1.0)
pH: 7 (ref 5.0–8.0)

## 2011-10-21 LAB — DIFFERENTIAL
Basophils Absolute: 0 10*3/uL (ref 0.0–0.1)
Basophils Relative: 1 % (ref 0–1)
Eosinophils Absolute: 0.1 10*3/uL (ref 0.0–0.7)
Eosinophils Relative: 2 % (ref 0–5)
Lymphocytes Relative: 48 % — ABNORMAL HIGH (ref 12–46)
Lymphs Abs: 3.1 10*3/uL (ref 0.7–4.0)
Monocytes Absolute: 0.4 10*3/uL (ref 0.1–1.0)
Monocytes Relative: 6 % (ref 3–12)
Neutro Abs: 2.9 10*3/uL (ref 1.7–7.7)
Neutrophils Relative %: 44 % (ref 43–77)

## 2011-10-21 LAB — CBC
HCT: 40.7 % (ref 36.0–46.0)
Hemoglobin: 12.9 g/dL (ref 12.0–15.0)
MCH: 23.5 pg — ABNORMAL LOW (ref 26.0–34.0)
MCHC: 31.7 g/dL (ref 30.0–36.0)
MCV: 74.3 fL — ABNORMAL LOW (ref 78.0–100.0)
Platelets: 251 10*3/uL (ref 150–400)
RBC: 5.48 MIL/uL — ABNORMAL HIGH (ref 3.87–5.11)
RDW: 15 % (ref 11.5–15.5)
WBC: 6.4 10*3/uL (ref 4.0–10.5)

## 2011-10-21 LAB — APTT: aPTT: 40 seconds — ABNORMAL HIGH (ref 24–37)

## 2011-10-21 LAB — COMPREHENSIVE METABOLIC PANEL
ALT: 15 U/L (ref 0–35)
AST: 14 U/L (ref 0–37)
Albumin: 3.7 g/dL (ref 3.5–5.2)
Alkaline Phosphatase: 81 U/L (ref 39–117)
BUN: 14 mg/dL (ref 6–23)
CO2: 31 mEq/L (ref 19–32)
Calcium: 9.6 mg/dL (ref 8.4–10.5)
Chloride: 102 mEq/L (ref 96–112)
Creatinine, Ser: 0.65 mg/dL (ref 0.50–1.10)
GFR calc Af Amer: >90 mL/min (ref >90)
GFR calc non Af Amer: >90 mL/min (ref >90)
Glucose, Bld: 93 mg/dL (ref 70–99)
Potassium: 3.8 mEq/L (ref 3.5–5.1)
Sodium: 139 mEq/L (ref 135–145)
Total Bilirubin: 0.5 mg/dL (ref 0.3–1.2)
Total Protein: 7 g/dL (ref 6.0–8.3)

## 2011-10-21 LAB — URINE MICROSCOPIC-ADD ON

## 2011-10-21 NOTE — Patient Instructions (Signed)
20 BELISA EICHHOLZ  10/21/2011   Your procedure is scheduled on:  10/27/11   Wednesday   Surgery 1100-1300  Report to [facility] at  0830     AM.  Call this number if you have problems the morning of surgery: [phone]     Or PST   [id number]  [facility]   Remember:   Do not eat food or drink any fluids :After Midnight. Tuesday NIGHT    Take these medicines the morning of surgery with A SIP OF WATER: METOPROLOL                                                   NORCO IF NEEDED   Do not wear jewelry, make-up or nail polish.  Do not wear lotions, powders, or perfumes. You may wear deodorant.  Do not shave 48 hours prior to surgery.  Do not bring valuables to the hospital.  Contacts, dentures or bridgework may not be worn into surgery.  Leave suitcase in the car. After surgery it may be brought to your room.  For patients admitted to the hospital, checkout time is 11:00 AM the day of discharge.   Patients discharged the day of surgery will not be allowed to drive home.  Name and phone number of your driver:    daughter                                                                  Special Instructions: CHG Shower Use Special Wash: 1/2 bottle night before surgery and 1/2 bottle morning of surgery. REGULAR SOAP FACE AND PRIVATES              LADIES- NO SHAVING 48 HOURS BEFORE USING BETASEPT SOAP.             Please read over the following fact sheets that you were given: MRSA Information

## 2011-10-21 NOTE — Pre-Procedure Instructions (Signed)
Clearance Dr Arthur Holms on chart. Patient states just got a new rx for lasartan 100mg . Also states she has positive tb skin test history with exposure 1991 and completed meds

## 2011-10-21 NOTE — Pre-Procedure Instructions (Signed)
Faxed urine with micro and PTT to Dr [name] with confirmation

## 2011-10-27 ENCOUNTER — Encounter (HOSPITAL_COMMUNITY)
Admission: RE | Disposition: A | Discharge status: Home Health | Payer: Self-pay | Source: Ambulatory Visit | Attending: Orthopedic Surgery

## 2011-10-27 ENCOUNTER — Ambulatory Visit (HOSPITAL_COMMUNITY): Payer: Medicare Other | Admitting: Anesthesiology

## 2011-10-27 ENCOUNTER — Encounter (HOSPITAL_COMMUNITY): Payer: Self-pay

## 2011-10-27 ENCOUNTER — Ambulatory Visit (HOSPITAL_COMMUNITY): Payer: Medicare Other

## 2011-10-27 ENCOUNTER — Encounter (HOSPITAL_COMMUNITY): Payer: Self-pay | Admitting: Anesthesiology

## 2011-10-27 ENCOUNTER — Inpatient Hospital Stay (HOSPITAL_COMMUNITY)
Admission: RE | Admit: 2011-10-27 | Discharge: 2011-10-29 | DRG: 490 | Disposition: A | Discharge status: Home Health | Payer: Medicare Other | Source: Ambulatory Visit | Attending: Orthopedic Surgery | Admitting: Orthopedic Surgery

## 2011-10-27 DIAGNOSIS — Z7982 Long term (current) use of aspirin: Secondary | ICD-10-CM

## 2011-10-27 DIAGNOSIS — M5126 Other intervertebral disc displacement, lumbar region: Secondary | ICD-10-CM | POA: Diagnosis present

## 2011-10-27 DIAGNOSIS — Z6841 Body Mass Index (BMI) 40.0 and over, adult: Secondary | ICD-10-CM

## 2011-10-27 DIAGNOSIS — M48061 Spinal stenosis, lumbar region without neurogenic claudication: Principal | ICD-10-CM | POA: Diagnosis present

## 2011-10-27 DIAGNOSIS — I10 Essential (primary) hypertension: Secondary | ICD-10-CM | POA: Diagnosis present

## 2011-10-27 DIAGNOSIS — K219 Gastro-esophageal reflux disease without esophagitis: Secondary | ICD-10-CM | POA: Diagnosis present

## 2011-10-27 SURGERY — DECOMPRESSIVE LUMBAR LAMINECTOMY LEVEL 2
Anesthesia: General | Site: Back | Laterality: Right | Wound class: Clean

## 2011-10-27 MED ORDER — CLINDAMYCIN PHOSPHATE 900 MG/50ML IV SOLN
900.0000 mg | Freq: Three times a day (TID) | INTRAVENOUS | Status: DC
  Administered 2011-10-27: 900 mg via INTRAVENOUS
  Filled 2011-10-27 (×2): qty 50

## 2011-10-27 MED ORDER — CLINDAMYCIN PHOSPHATE 900 MG/50ML IV SOLN
INTRAVENOUS | Status: AC
  Filled 2011-10-27: qty 50

## 2011-10-27 MED ORDER — PROPOFOL 10 MG/ML IV EMUL
INTRAVENOUS | Status: DC | PRN
  Administered 2011-10-27: 175 mg via INTRAVENOUS

## 2011-10-27 MED ORDER — SUCCINYLCHOLINE CHLORIDE 20 MG/ML IJ SOLN
INTRAMUSCULAR | Status: DC | PRN
  Administered 2011-10-27: 100 mg via INTRAVENOUS

## 2011-10-27 MED ORDER — BUPIVACAINE-EPINEPHRINE PF 0.25-1:200000 % IJ SOLN
INTRAMUSCULAR | Status: AC
  Filled 2011-10-27: qty 30

## 2011-10-27 MED ORDER — HYDROCHLOROTHIAZIDE 25 MG PO TABS
25.0000 mg | ORAL_TABLET | Freq: Every day | ORAL | Status: DC
  Administered 2011-10-28 – 2011-10-29 (×2): 25 mg via ORAL
  Filled 2011-10-27 (×3): qty 1

## 2011-10-27 MED ORDER — HYDROMORPHONE HCL PF 1 MG/ML IJ SOLN: 0.5000 mg | INTRAMUSCULAR | Status: DC | PRN

## 2011-10-27 MED ORDER — PHENOL 1.4 % MT LIQD
1.0000 | OROMUCOSAL | Status: DC | PRN
  Filled 2011-10-27: qty 177

## 2011-10-27 MED ORDER — ACETAMINOPHEN 650 MG RE SUPP: 650.0000 mg | RECTAL | Status: DC | PRN

## 2011-10-27 MED ORDER — MENTHOL 3 MG MT LOZG
1.0000 | LOZENGE | OROMUCOSAL | Status: DC | PRN
  Filled 2011-10-27: qty 9

## 2011-10-27 MED ORDER — METHOCARBAMOL 500 MG PO TABS
500.0000 mg | ORAL_TABLET | Freq: Four times a day (QID) | ORAL | Status: DC | PRN
  Administered 2011-10-28 (×2): 500 mg via ORAL
  Filled 2011-10-27 (×2): qty 1

## 2011-10-27 MED ORDER — POLYETHYLENE GLYCOL 3350 17 G PO PACK: 17.0000 g | PACK | Freq: Every day | ORAL | Status: DC | PRN

## 2011-10-27 MED ORDER — PROMETHAZINE HCL 25 MG/ML IJ SOLN: 6.2500 mg | INTRAMUSCULAR | Status: DC | PRN

## 2011-10-27 MED ORDER — HYDROMORPHONE HCL PF 1 MG/ML IJ SOLN
0.2500 mg | INTRAMUSCULAR | Status: DC | PRN
  Administered 2011-10-27: 0.5 mg via INTRAVENOUS

## 2011-10-27 MED ORDER — ONDANSETRON HCL 4 MG/2ML IJ SOLN: 4.0000 mg | INTRAMUSCULAR | Status: DC | PRN

## 2011-10-27 MED ORDER — THROMBIN 5000 UNITS EX SOLR
OROMUCOSAL | Status: DC | PRN
  Administered 2011-10-27: 12:00:00 via TOPICAL

## 2011-10-27 MED ORDER — ROCURONIUM BROMIDE 100 MG/10ML IV SOLN
INTRAVENOUS | Status: DC | PRN
  Administered 2011-10-27: 10 mg via INTRAVENOUS
  Administered 2011-10-27: 40 mg via INTRAVENOUS

## 2011-10-27 MED ORDER — ONDANSETRON HCL 4 MG/2ML IJ SOLN
INTRAMUSCULAR | Status: DC | PRN
  Administered 2011-10-27: 4 mg via INTRAVENOUS

## 2011-10-27 MED ORDER — METOPROLOL TARTRATE 50 MG PO TABS
50.0000 mg | ORAL_TABLET | Freq: Two times a day (BID) | ORAL | Status: DC
  Administered 2011-10-27 – 2011-10-29 (×4): 50 mg via ORAL
  Filled 2011-10-27 (×5): qty 1

## 2011-10-27 MED ORDER — BACITRACIN-NEOMYCIN-POLYMYXIN 400-5-5000 EX OINT
TOPICAL_OINTMENT | CUTANEOUS | Status: AC
  Filled 2011-10-27: qty 1

## 2011-10-27 MED ORDER — ACETAMINOPHEN 10 MG/ML IV SOLN
INTRAVENOUS | Status: DC | PRN
  Administered 2011-10-27: 1000 mg via INTRAVENOUS

## 2011-10-27 MED ORDER — OXYCODONE-ACETAMINOPHEN 5-325 MG PO TABS
1.0000 | ORAL_TABLET | ORAL | Status: DC | PRN
  Administered 2011-10-28: 1 via ORAL
  Filled 2011-10-27: qty 2

## 2011-10-27 MED ORDER — CLINDAMYCIN PHOSPHATE 900 MG/50ML IV SOLN
900.0000 mg | Freq: Three times a day (TID) | INTRAVENOUS | Status: AC
  Administered 2011-10-28 (×2): 900 mg via INTRAVENOUS
  Filled 2011-10-27 (×2): qty 50

## 2011-10-27 MED ORDER — EPHEDRINE SULFATE 50 MG/ML IJ SOLN
INTRAMUSCULAR | Status: DC | PRN
  Administered 2011-10-27 (×5): 5 mg via INTRAVENOUS

## 2011-10-27 MED ORDER — MIDAZOLAM HCL 5 MG/5ML IJ SOLN
INTRAMUSCULAR | Status: DC | PRN
  Administered 2011-10-27: 2 mg via INTRAVENOUS

## 2011-10-27 MED ORDER — LACTATED RINGERS IV SOLN
INTRAVENOUS | Status: DC
  Administered 2011-10-27 (×2): via INTRAVENOUS

## 2011-10-27 MED ORDER — HYDROMORPHONE HCL PF 1 MG/ML IJ SOLN
INTRAMUSCULAR | Status: AC
  Filled 2011-10-27: qty 1

## 2011-10-27 MED ORDER — DEXAMETHASONE SODIUM PHOSPHATE 10 MG/ML IJ SOLN
INTRAMUSCULAR | Status: DC | PRN
  Administered 2011-10-27: 10 mg via INTRAVENOUS

## 2011-10-27 MED ORDER — LACTATED RINGERS IV SOLN
INTRAVENOUS | Status: DC
  Administered 2011-10-27 – 2011-10-28 (×3): via INTRAVENOUS

## 2011-10-27 MED ORDER — GLYCOPYRROLATE 0.2 MG/ML IJ SOLN
INTRAMUSCULAR | Status: DC | PRN
  Administered 2011-10-27: .6 mg via INTRAVENOUS

## 2011-10-27 MED ORDER — SODIUM CHLORIDE 0.9 % IR SOLN
Status: DC | PRN
  Administered 2011-10-27: 12:00:00

## 2011-10-27 MED ORDER — BUPIVACAINE LIPOSOME 1.3 % IJ SUSP
20.0000 mL | Freq: Once | INTRAMUSCULAR | Status: AC
  Administered 2011-10-27: 20 mL
  Filled 2011-10-27: qty 20

## 2011-10-27 MED ORDER — ACETAMINOPHEN 10 MG/ML IV SOLN
INTRAVENOUS | Status: AC
  Filled 2011-10-27: qty 100

## 2011-10-27 MED ORDER — FENTANYL CITRATE 0.05 MG/ML IJ SOLN
INTRAMUSCULAR | Status: DC | PRN
  Administered 2011-10-27: 100 ug via INTRAVENOUS
  Administered 2011-10-27 (×3): 50 ug via INTRAVENOUS

## 2011-10-27 MED ORDER — BISACODYL 5 MG PO TBEC: 5.0000 mg | DELAYED_RELEASE_TABLET | Freq: Every day | ORAL | Status: DC | PRN

## 2011-10-27 MED ORDER — NEOSTIGMINE METHYLSULFATE 1 MG/ML IJ SOLN
INTRAMUSCULAR | Status: DC | PRN
  Administered 2011-10-27: 5 mg via INTRAVENOUS

## 2011-10-27 MED ORDER — BUPIVACAINE-EPINEPHRINE 0.25% -1:200000 IJ SOLN
INTRAMUSCULAR | Status: DC | PRN
  Administered 2011-10-27: 30 mL

## 2011-10-27 MED ORDER — METHOCARBAMOL 100 MG/ML IJ SOLN
500.0000 mg | Freq: Four times a day (QID) | INTRAVENOUS | Status: DC | PRN
  Administered 2011-10-27: 500 mg via INTRAVENOUS
  Filled 2011-10-27: qty 5

## 2011-10-27 MED ORDER — BACITRACIN-NEOMYCIN-POLYMYXIN 400-5-5000 EX OINT
TOPICAL_OINTMENT | CUTANEOUS | Status: DC | PRN
  Administered 2011-10-27: 1 via TOPICAL

## 2011-10-27 MED ORDER — ACETAMINOPHEN 325 MG PO TABS
650.0000 mg | ORAL_TABLET | ORAL | Status: DC | PRN
  Administered 2011-10-28 – 2011-10-29 (×2): 650 mg via ORAL
  Filled 2011-10-27 (×2): qty 2

## 2011-10-27 MED ORDER — FLEET ENEMA 7-19 GM/118ML RE ENEM: 1.0000 | ENEMA | Freq: Once | RECTAL | Status: AC | PRN

## 2011-10-27 MED ORDER — LIDOCAINE HCL (CARDIAC) 20 MG/ML IV SOLN
INTRAVENOUS | Status: DC | PRN
  Administered 2011-10-27 (×2): 50 mg via INTRAVENOUS

## 2011-10-27 MED ORDER — CLINDAMYCIN PHOSPHATE 900 MG/50ML IV SOLN
900.0000 mg | INTRAVENOUS | Status: AC
  Administered 2011-10-27: 900 mg via INTRAVENOUS

## 2011-10-27 MED ORDER — LOSARTAN POTASSIUM 50 MG PO TABS
100.0000 mg | ORAL_TABLET | Freq: Every day | ORAL | Status: DC
  Administered 2011-10-28 – 2011-10-29 (×2): 100 mg via ORAL
  Filled 2011-10-27 (×3): qty 2

## 2011-10-27 MED ORDER — HYDROCODONE-ACETAMINOPHEN 5-325 MG PO TABS
1.0000 | ORAL_TABLET | ORAL | Status: DC | PRN
  Administered 2011-10-27 (×2): 1 via ORAL
  Administered 2011-10-28: 2 via ORAL
  Administered 2011-10-28 – 2011-10-29 (×4): 1 via ORAL
  Filled 2011-10-27: qty 1
  Filled 2011-10-27: qty 2
  Filled 2011-10-27 (×5): qty 1

## 2011-10-27 MED ORDER — THROMBIN 5000 UNITS EX SOLR
CUTANEOUS | Status: AC
  Filled 2011-10-27: qty 10000

## 2011-10-27 SURGICAL SUPPLY — 41 items
BAG ZIPLOCK 12X15 (MISCELLANEOUS) ×3 IMPLANT
BENZOIN TINCTURE PRP APPL 2/3 (GAUZE/BANDAGES/DRESSINGS) ×3 IMPLANT
CLEANER TIP ELECTROSURG 2X2 (MISCELLANEOUS) ×3 IMPLANT
CLOTH BEACON ORANGE TIMEOUT ST (SAFETY) ×3 IMPLANT
CONT SPECI 4OZ STER CLIK (MISCELLANEOUS) IMPLANT
DRAIN PENROSE 18X1/4 LTX STRL (WOUND CARE) IMPLANT
DRAPE LG THREE QUARTER DISP (DRAPES) ×6 IMPLANT
DRAPE MICROSCOPE LEICA (MISCELLANEOUS) ×3 IMPLANT
DRAPE POUCH INSTRU U-SHP 10X18 (DRAPES) ×3 IMPLANT
DRAPE SURG 17X11 SM STRL (DRAPES) ×3 IMPLANT
DRSG ADAPTIC 3X8 NADH LF (GAUZE/BANDAGES/DRESSINGS) ×3 IMPLANT
DRSG PAD ABDOMINAL 8X10 ST (GAUZE/BANDAGES/DRESSINGS) ×3 IMPLANT
DURAPREP 26ML APPLICATOR (WOUND CARE) ×3 IMPLANT
ELECT REM PT RETURN 9FT ADLT (ELECTROSURGICAL) ×3
ELECTRODE REM PT RTRN 9FT ADLT (ELECTROSURGICAL) ×2 IMPLANT
GLOVE BIOGEL PI IND STRL 8 (GLOVE) ×2 IMPLANT
GLOVE BIOGEL PI INDICATOR 8 (GLOVE) ×1
GLOVE ECLIPSE 8.0 STRL XLNG CF (GLOVE) ×6 IMPLANT
GLOVE SURG SS PI 8.0 STRL IVOR (GLOVE) ×3 IMPLANT
GOWN PREVENTION PLUS LG XLONG (DISPOSABLE) ×3 IMPLANT
GOWN STRL REIN XL XLG (GOWN DISPOSABLE) ×6 IMPLANT
KIT BASIN OR (CUSTOM PROCEDURE TRAY) ×3 IMPLANT
KIT POSITIONING SURG ANDREWS (MISCELLANEOUS) ×3 IMPLANT
MANIFOLD NEPTUNE II (INSTRUMENTS) ×3 IMPLANT
NEEDLE SPNL 18GX3.5 QUINCKE PK (NEEDLE) ×6 IMPLANT
PATTIES SURGICAL .5 X.5 (GAUZE/BANDAGES/DRESSINGS) IMPLANT
PATTIES SURGICAL .75X.75 (GAUZE/BANDAGES/DRESSINGS) IMPLANT
PATTIES SURGICAL 1X1 (DISPOSABLE) IMPLANT
PIN SAFETY NICK PLATE  2 MED (MISCELLANEOUS)
PIN SAFETY NICK PLATE 2 MED (MISCELLANEOUS) IMPLANT
SPONGE GAUZE 4X4 12PLY (GAUZE/BANDAGES/DRESSINGS) ×3 IMPLANT
SPONGE LAP 4X18 X RAY DECT (DISPOSABLE) ×12 IMPLANT
SPONGE SURGIFOAM ABS GEL 100 (HEMOSTASIS) ×3 IMPLANT
STAPLER VISISTAT 35W (STAPLE) ×3 IMPLANT
SUT VIC AB 0 CT1 27 (SUTURE) ×1
SUT VIC AB 0 CT1 27XBRD ANTBC (SUTURE) ×2 IMPLANT
SUT VIC AB 1 CT1 27 (SUTURE) ×4
SUT VIC AB 1 CT1 27XBRD ANTBC (SUTURE) ×8 IMPLANT
TOWEL OR 17X26 10 PK STRL BLUE (TOWEL DISPOSABLE) ×6 IMPLANT
TOWEL OR NON WOVEN STRL DISP B (DISPOSABLE) ×3 IMPLANT
TRAY LAMINECTOMY (CUSTOM PROCEDURE TRAY) ×3 IMPLANT

## 2011-10-27 NOTE — Brief Op Note (Signed)
10/27/2011  1:05 PM  PATIENT:  Carolyn Fowler  66 y.o. female  PRE-OPERATIVE DIAGNOSIS:  lumbar herniated disc on right with severe Spinal Stenosis at two levels  POST-OPERATIVE DIAGNOSIS:  lumbar herniated disc on right with Severe Spinal Stenosis  PROCEDURE:  Procedure(s) (LRB): DECOMPRESSIVE LUMBAR LAMINECTOMY LEVEL at L-3--L-4 and L-4-- L-5 with foraminotomies and decompression of Two nerve roots Bilaterally.  SURGEON:  Surgeon(s) and Role:    * Jacki Cones, MD - Primary    * Javier Docker, MD - Assisting     ASSISTANTS: Paula Libra MD  ANESTHESIA:   general  EBL:  Total I/O In: 1500 [I.V.:1500] Out: 100 [Blood:100]  BLOOD ADMINISTERED:none  DRAINS: none   LOCAL MEDICATIONS USED:  MARCAINE  30cc of 0.25% with Epinephrine at start of case  and at end of case 20cc of Exaprel mixed with 10cc of Normal Saline.   SPECIMEN:  No Specimen  DISPOSITION OF SPECIMEN:  N/A  COUNTS:  YES  TOURNIQUET:  * No tourniquets in log *  DICTATION: .Other Dictation: Dictation Number [id number]  PLAN OF CARE: Admit to inpatient   PATIENT DISPOSITION:  PACU - hemodynamically stable.   Delay start of Pharmacological VTE agent (>24hrs) due to surgical blood loss or risk of bleeding: yes

## 2011-10-27 NOTE — Anesthesia Preprocedure Evaluation (Signed)
Anesthesia Evaluation  Patient identified by MRN, date of birth, ID band Patient awake    Reviewed: Allergy & Precautions, H&P , NPO status , Patient's Chart, lab work & pertinent test results  Airway Mallampati: II TM Distance: <3 FB Neck ROM: Full    Dental No notable dental hx. (+) Dental Advisory Given   Pulmonary neg pulmonary ROS,  breath sounds clear to auscultation  Pulmonary exam normal       Cardiovascular hypertension, Pt. on medications Rhythm:Regular Rate:Normal     Neuro/Psych negative neurological ROS  negative psych ROS   GI/Hepatic Neg liver ROS, GERD-  Medicated,  Endo/Other  Morbid obesity  Renal/GU negative Renal ROS  negative genitourinary   Musculoskeletal negative musculoskeletal ROS (+)   Abdominal   Peds negative pediatric ROS (+)  Hematology negative hematology ROS (+)   Anesthesia Other Findings   Reproductive/Obstetrics negative OB ROS                           Anesthesia Physical Anesthesia Plan  ASA: III  Anesthesia Plan: General   Post-op Pain Management:    Induction: Intravenous  Airway Management Planned: Oral ETT  Additional Equipment:   Intra-op Plan:   Post-operative Plan: Extubation in OR  Informed Consent: I have reviewed the patients History and Physical, chart, labs and discussed the procedure including the risks, benefits and alternatives for the proposed anesthesia with the patient or authorized representative who has indicated his/her understanding and acceptance.   Dental advisory given  Plan Discussed with: CRNA and Surgeon  Anesthesia Plan Comments:         Anesthesia Quick Evaluation

## 2011-10-27 NOTE — Progress Notes (Signed)
Utilization review completed.  

## 2011-10-27 NOTE — Anesthesia Postprocedure Evaluation (Signed)
  Anesthesia Post-op Note  Patient: Carolyn Fowler  Procedure(s) Performed: Procedure(s) (LRB): DECOMPRESSIVE LUMBAR LAMINECTOMY LEVEL 2 (N/A)  Patient Location: PACU  Anesthesia Type: General  Level of Consciousness: awake and alert   Airway and Oxygen Therapy: Patient Spontanous Breathing  Post-op Pain: mild  Post-op Assessment: Post-op Vital signs reviewed, Patient's Cardiovascular Status Stable, Respiratory Function Stable, Patent Airway and No signs of Nausea or vomiting  Post-op Vital Signs: stable  Complications: No apparent anesthesia complications

## 2011-10-27 NOTE — Preoperative (Signed)
Beta Blockers   Reason not to administer Beta Blockers:pt took metoprolol this am

## 2011-10-27 NOTE — H&P (View-Only) (Signed)
Patient stated took 3 day dosage cipro called in for UTI. Feels asymptomatic 

## 2011-10-27 NOTE — Progress Notes (Signed)
Patient stated took 3 day dosage cipro called in for UTI. Feels asymptomatic

## 2011-10-27 NOTE — Interval H&P Note (Signed)
History and Physical Interval Note:  10/27/2011 10:56 AM  Carolyn Fowler  has presented today for surgery, with the diagnosis of lumbar herniated disc on right  The various methods of treatment have been discussed with the patient and family. After consideration of risks, benefits and other options for treatment, the patient has consented to  Procedure(s) (LRB): DECOMPRESSIVE LUMBAR LAMINECTOMY LEVEL 1 (Right) as a surgical intervention .  The patient's history has been reviewed, patient examined, no change in status, stable for surgery.  I have reviewed the patient's chart and labs.  Questions were answered to the patient's satisfaction.     , A

## 2011-10-27 NOTE — Transfer of Care (Signed)
Immediate Anesthesia Transfer of Care Note  Patient: Carolyn Fowler  Procedure(s) Performed: Procedure(s) (LRB): DECOMPRESSIVE LUMBAR LAMINECTOMY LEVEL 2 (N/A)  Patient Location: PACU  Anesthesia Type: General  Level of Consciousness: awake, alert  and oriented  Airway & Oxygen Therapy: Patient Spontanous Breathing and Patient connected to face mask oxygen  Post-op Assessment: Report given to PACU RN and Post -op Vital signs reviewed and stable  Post vital signs: Reviewed and stable  Complications: No apparent anesthesia complications

## 2011-10-28 NOTE — Progress Notes (Signed)
Physical Therapy Treatment Patient Details Name: Carolyn Fowler MRN: [id number] DOB: Mar 04, 1946 Today's Date: 10/28/2011 Time: 4098-1191 PT Time Calculation (min): 34 min  PT Assessment / Plan / Recommendation Comments on Treatment Session       Follow Up Recommendations  No PT follow up    Barriers to Discharge        Equipment Recommendations  Other (comment)    Recommendations for Other Services OT consult  Frequency 7X/week   Plan Discharge plan remains appropriate    Precautions / Restrictions Precautions Precautions: Back Precaution Booklet Issued: Yes (comment) Precaution Comments: Reviewed back care handout and all precautions with pt.  Restrictions Weight Bearing Restrictions: No   Pertinent Vitals/Pain 7-8/10; RN providing MEDs at beginning of session    Mobility  Bed Mobility Bed Mobility: Rolling Left;Left Sidelying to Sit Rolling Left: 4: Min guard Left Sidelying to Sit: 4: Min assist;HOB flat Details for Bed Mobility Assistance: verbal cues for technique with log roll.  Transfers Transfers: Sit to Stand;Stand to Sit Sit to Stand: 4: Min guard;From bed;From chair/3-in-1 Stand to Sit: 4: Min guard;To chair/3-in-1 Details for Transfer Assistance: verbal cues for hand placement and back precautions. Ambulation/Gait Ambulation/Gait Assistance: 4: Min guard;4: Min Environmental consultant (Feet): 200 Feet (and 25') Assistive device: Rolling walker Ambulation/Gait Assistance Details: min cues for posture and position from RW Gait Pattern: Step-through pattern    Exercises     PT Diagnosis:    PT Problem List:   PT Treatment Interventions:     PT Goals Acute Rehab PT Goals PT Goal Formulation: With patient Time For Goal Achievement: 11/01/11 Potential to Achieve Goals: Good Pt will go Supine/Side to Sit: with supervision PT Goal: Supine/Side to Sit - Progress: Progressing toward goal Pt will go Sit to Supine/Side: with supervision PT Goal:  Sit to Supine/Side - Progress: Progressing toward goal Pt will go Sit to Stand: with supervision PT Goal: Sit to Stand - Progress: Progressing toward goal Pt will go Stand to Sit: with supervision PT Goal: Stand to Sit - Progress: Progressing toward goal Pt will Ambulate: >150 feet;with supervision;with rolling walker PT Goal: Ambulate - Progress: Progressing toward goal Pt will Go Up / Down Stairs: 3-5 stairs;with min assist;with rail(s) PT Goal: Up/Down Stairs - Progress: Goal set today  Visit Information  Last PT Received On: 10/28/11 Assistance Needed: +1    Subjective Data  Subjective: My leg still feels good but my back hurts a lot Patient Stated Goal: Resume previous lifestyle with decreased pain   Cognition  Overall Cognitive Status: Appears within functional limits for tasks assessed/performed Arousal/Alertness: Awake/alert Orientation Level: Appears intact for tasks assessed Behavior During Session: [facility] for tasks performed    Balance     End of Session PT - End of Session Activity Tolerance: Patient tolerated treatment well Patient left: in chair;with call bell/phone within reach Nurse Communication: Mobility status   GP     , 10/28/2011, 3:52 PM

## 2011-10-28 NOTE — Evaluation (Signed)
Physical Therapy Evaluation Patient Details Name: Carolyn Fowler MRN: [id number] DOB: 04/26/1945 Today's Date: 10/28/2011 Time: 4098-1191 PT Time Calculation (min): 32 min  PT Assessment / Plan / Recommendation Clinical Impression  Pt s/p lumbar laminectomy presents with Limitations in functional mobility 2* back pain and back precautions    PT Assessment  Patient needs continued PT services    Follow Up Recommendations  No PT follow up    Barriers to Discharge        Equipment Recommendations  Other (comment) (pt checking to see if she has a 3in1)    Recommendations for Other Services OT consult   Frequency 7X/week    Precautions / Restrictions Precautions Precautions: Back Precaution Booklet Issued: Yes (comment) Precaution Comments: Reviewed back care handout and all precautions with pt.  Restrictions Weight Bearing Restrictions: No   Pertinent Vitals/Pain 8/10 with activity, pt premedicated, RN aware     Mobility  Bed Mobility Bed Mobility: Rolling Left;Left Sidelying to Sit Rolling Left: 4: Min guard Left Sidelying to Sit: 4: Min assist;HOB flat Details for Bed Mobility Assistance: verbal cues for technique with log roll.  Transfers Transfers: Sit to Stand;Stand to Sit Sit to Stand: 4: Min guard;From bed;From chair/3-in-1 Stand to Sit: 4: Min guard;To chair/3-in-1 Details for Transfer Assistance: verbal cues for hand placement and back precautions. Ambulation/Gait Ambulation/Gait Assistance: 4: Min guard;4: Min Environmental consultant (Feet): 100 Feet Assistive device: Rolling walker Ambulation/Gait Assistance Details: cues for posture and position from RW Gait Pattern: Step-through pattern    Exercises     PT Diagnosis: Difficulty walking  PT Problem List: Decreased activity tolerance;Decreased mobility;Decreased knowledge of use of DME;Decreased knowledge of precautions;Obesity;Pain PT Treatment Interventions: DME instruction;Gait training;Stair  training;Functional mobility training;Therapeutic activities;Patient/family education   PT Goals Acute Rehab PT Goals PT Goal Formulation: With patient Time For Goal Achievement: 11/01/11 Potential to Achieve Goals: Good Pt will go Supine/Side to Sit: with supervision PT Goal: Supine/Side to Sit - Progress: Goal set today Pt will go Sit to Supine/Side: with supervision PT Goal: Sit to Supine/Side - Progress: Goal set today Pt will go Sit to Stand: with supervision PT Goal: Sit to Stand - Progress: Goal set today Pt will go Stand to Sit: with supervision PT Goal: Stand to Sit - Progress: Goal set today Pt will Ambulate: >150 feet;with supervision;with rolling walker PT Goal: Ambulate - Progress: Goal set today Pt will Go Up / Down Stairs: 3-5 stairs;with min assist;with rail(s) PT Goal: Up/Down Stairs - Progress: Goal set today  Visit Information  Last PT Received On: 10/28/11 Assistance Needed: +1 PT/OT Co-Evaluation/Treatment: Yes    Subjective Data  Subjective: I feel like I have a brand new leg on the Right - it feels so much better Patient Stated Goal: Resume previous lifestyle with decreased pain   Prior Functioning  Home Living Lives With: Alone Available Help at Discharge: Family Type of Home: House Home Access: Stairs to enter Secretary/administrator of Steps: 4 Entrance Stairs-Rails: Right;Left;Can reach both Home Layout: One level Bathroom Shower/Tub: Forensic scientist: Standard Home Adaptive Equipment: Walker - rolling;Shower chair with back;Raised toilet seat with rails;Crutches Additional Comments: Pt believes she has a 3in1 but will check Prior Function Level of Independence: Independent Able to Take Stairs?: Yes Communication Communication: No difficulties    Cognition  Overall Cognitive Status: Appears within functional limits for tasks assessed/performed Arousal/Alertness: Awake/alert Orientation Level: Appears intact for tasks  assessed Behavior During Session: [facility] for tasks performed  Extremity/Trunk Assessment Right Upper Extremity Assessment RUE ROM/Strength/Tone: [facility] for tasks assessed Left Upper Extremity Assessment LUE ROM/Strength/Tone: WFL for tasks assessed Right Lower Extremity Assessment RLE ROM/Strength/Tone: [facility] for tasks assessed Left Lower Extremity Assessment LLE ROM/Strength/Tone: WFL for tasks assessed   Balance    End of Session PT - End of Session Activity Tolerance: Patient tolerated treatment well Patient left: in chair;with call bell/phone within reach Nurse Communication: Mobility status  GP     , 10/28/2011, 12:38 PM

## 2011-10-28 NOTE — Progress Notes (Signed)
CSW consulted for SNF placement. PN reviewed. Pt plans to return home following hospital d/c. RNCM will assist with d/c planning needs. SNF is available to assist if plan changes and SNF placement is needed.    Cori Razor LCSW [phone]

## 2011-10-28 NOTE — Progress Notes (Signed)
   Subjective: 1 Day Post-Op Procedure(s) (LRB): DECOMPRESSIVE LUMBAR LAMINECTOMY LEVEL 2 (N/A) Patient reports pain as mild.   Patient seen in rounds without Dr. [name]. Patient is well, and has had no acute complaints or problems. She is very pleased with her results. She reports that the right leg now feels like the left leg. The numbness, tingling, and pain is gone. She does have some low back discomfort from inflammation. No complaints of chest pain or shortness of breath. She is anxious to get up and walk. Plan is to go Home after hospital stay.  Objective: Vital signs in last 24 hours: Temp:  [97.3 F (36.3 C)-98.7 F (37.1 C)] 97.9 F (36.6 C) (08/22 0651) Pulse Rate:  [63-80] 67  (08/22 0651) Resp:  [11-20] 18  (08/22 0651) BP: (117-152)/(69-100) 117/70 mmHg (08/22 0651) SpO2:  [95 %-100 %] 95 % (08/22 0651) Weight:  [113.399 kg (250 lb)] 113.399 kg (250 lb) (08/21 1450)  Intake/Output from previous day:  Intake/Output Summary (Last 24 hours) at 10/28/11 0739 Last data filed at 10/28/11 0600  Gross per 24 hour  Intake   3350 ml  Output    700 ml  Net   2650 ml     EXAM General - Patient is Alert and Oriented Extremity - Neurologically intact Sensation intact distally Intact pulses distally Dorsiflexion/Plantar flexion intact Dressing/Incision - dressing C/D/I Motor Function - intact, moving foot and toes well on exam.   Past Medical History  Diagnosis Date  . Hypertension   . Dysrhythmia     occ palpitations- no cardiologist  . GERD (gastroesophageal reflux disease)   . Arthritis   . Anemia   . Neuromuscular disorder     right leg tumbness and tingling from herniated disc  . History of keloid of skin     Assessment/Plan: 1 Day Post-Op Procedure(s) (LRB): DECOMPRESSIVE LUMBAR LAMINECTOMY LEVEL 2 (N/A) Active Problems:  Spinal stenosis of lumbar region at multiple levels   Advance diet Up with therapy Plan for discharge tomorrow  DVT Prophylaxis -  SCDs Weight-Bearing as tolerated  She will start therapy today. She cn get up and walk with a walker. We are going to continue to monitor her today and are hopeful for discharge tomorrow.   ,  LAUREN 10/28/2011, 7:39 AM

## 2011-10-28 NOTE — Op Note (Signed)
Carolyn Fowler, Carolyn Fowler NO.:  [id number]  MEDICAL RECORD NO.:  [id number]  LOCATION:  1609                         FACILITY:  [facility]  PHYSICIAN:  Georges Lynch. , M.D.DATE OF BIRTH:  1945-05-27  DATE OF PROCEDURE:  10/27/2011 DATE OF DISCHARGE:                              OPERATIVE REPORT   SURGEON:  Georges Lynch. [name], M.D.  ASSISTANT:  Jene Every, M.D.  PREOPERATIVE DIAGNOSES:  Spinal stenosis with a hard disk herniation with foraminal stenosis at L3-4 and foraminal stenosis at L4-5.  POSTOPERATIVE DIAGNOSES:  Spinal stenosis with a hard disk herniation with foraminal stenosis at L3-4 and foraminal stenosis at L4-5.  OPERATION: 1. Central decompressive lumbar laminectomy at L3-L4. 2. Foraminotomies for the 3 and 4 roots bilaterally. 3. Hemilaminectomy on the right of L4 in order to decompress the L5     root as well.  PROCEDURE:  Under general anesthesia, the patient on spinal frame, routine orthopedic prep and draping of lower back was carried out. Appropriate time-out was carried out prior to making incision, also I marked appropriate right side where all her symptoms or she had a partial footdrop highlight that on the right.  The preop MRI done as well as a lumbar myelogram and CT.  The procedure under general anesthesia after the routine orthopedic prep and draping and after the patient had a 900 mg of Cleocin, clindamycin.  Two needles were placed in the back and x-ray was taken.  Incision then was made over the L3-4, L4-5 region.  The muscle was stripped from the lamina and spinous process bilaterally.  Prior to doing that, I injected 30 mL of 0.25% Marcaine with epinephrine into the muscle layer we had to try cut down the bleeding.  After that, I then identified the L3 and the L4 spinous processes.  X-ray was taken to verify our position.  I then went down did a central decompressive lumbar laminectomy at L3-4.  She had a definite central  stenosis the ligamentum flavum was markedly thicken. We brought the microscope in at this time.  We protected the dura with cottonoids we went proximally and distally down to the lamina of L4 below.  We did take the lamina and spinous processes of L3 off.  We then utilized a hockey-stick to go proximally and laterally.  We were now free at those areas.  We then advanced the hockey stick distally and completed our excision of the ligamentum flavum which was markedly thickened all the way down to the lamina distally should all her symptoms were on the right.  We then went down the right side and did a partial hemilaminectomy on the right to make sure the root above, below the L5 root was free, the 4 root was free, 3 root was free.  We then examined the disk.  Disk was a hard disk.  There was no real true pressure or herniation on the sac, so we elected not to do anything that to destabilize especially with her body weight.  We utilized a hockey stick to go out distally, proximally, and laterally and made sure we were totally clear before we completed our decompression.  We thoroughly irrigated out the area.  I loosely  applied some thrombin-soaked Gelfoam. We had good maintenance of the bleeding.  I then closed the muscle in the usual fashion.  Leave a small distal deep and proximal part open for drainage purposes and I also injected a mixture of 20 mL of Exparel with 10 mL of normal saline in the soft tissue structures.  The remaining part of the wound was closed in the usual fashion.  Skin was closed with metal staples, and a sterile Neosporin dressing was applied.  Note:  All her pain was on the right side, there was a marked footdrop.          ______________________________ Georges Lynch. [name], M.D.     RAG/MEDQ  D:  10/27/2011  T:  10/28/2011  Job:  [id number]  cc:   Rosalyn Gess. Norins, MD [address] [place] [address]

## 2011-10-28 NOTE — Evaluation (Addendum)
Occupational Therapy Evaluation Patient Details Name: Carolyn Fowler MRN: [id number] DOB: February 10, 1946 Today's Date: 10/28/2011 Time: 4098-1191 OT Time Calculation (min): 40 min  OT Assessment / Plan / Recommendation Clinical Impression  Pt is s/p back surgery and displays increased pain and decreased functional mobility and ADL. Will benefit from skilled OT services to improve ADL independence and further educate on back precautions.    OT Assessment  Patient needs continued OT Services    Follow Up Recommendations  No OT follow up;Supervision/Assistance - 24 hour    Barriers to Discharge      Equipment Recommendations  Other (comment) (pt checking to see if she has a 3in1)    Recommendations for Other Services    Frequency  Min 2X/week    Precautions / Restrictions Precautions Precautions: Back Precaution Booklet Issued: Yes (comment) Precaution Comments: Reviewed back care handout and all precautions with pt.  Restrictions Weight Bearing Restrictions: No        ADL  Eating/Feeding: Simulated;Independent Where Assessed - Eating/Feeding: Chair Grooming: Performed;Min guard Where Assessed - Grooming: Unsupported standing Upper Body Bathing: Simulated;Chest;Right arm;Left arm;Abdomen;Supervision/safety;Set up;Other (comment) (supervision for back precautions) Where Assessed - Upper Body Bathing: Unsupported sitting Lower Body Bathing: Simulated;Moderate assistance;Other (comment) (without adaptive equipment) Where Assessed - Lower Body Bathing: Supported sit to stand Upper Body Dressing: Simulated;Minimal assistance Where Assessed - Upper Body Dressing: Unsupported sitting Lower Body Dressing: Simulated;Maximal assistance;Other (comment) (without adaptive equipment) Where Assessed - Lower Body Dressing: Supported sit to stand Toilet Transfer: Performed;Min Pension scheme manager Method: Other (comment) (ambulating) Toilet Transfer Equipment: Raised toilet seat with  arms (or 3-in-1 over toilet) Toileting - Clothing Manipulation and Hygiene: Simulated;Moderate assistance;Other (comment) (for posterior periarea due to precautions. couldnt reach ) Where Assessed - Toileting Clothing Manipulation and Hygiene: Sit to stand from 3-in-1 or toilet Tub/Shower Transfer Method: Not assessed Equipment Used: Rolling walker;Long-handled shoe horn;Long-handled sponge;Reacher;Sock aid ADL Comments: Educated on back precautions and reviewed handout. Pt interested in AE and will further educate on AE next visit. Pt will benefit from toilet aid also as she cant reacher periareas without breaking precautions.     OT Diagnosis: Generalized weakness;Acute pain  OT Problem List: Decreased strength;Pain;Decreased knowledge of use of DME or AE;Decreased knowledge of precautions OT Treatment Interventions: Self-care/ADL training;Therapeutic activities;DME and/or AE instruction;Patient/family education   OT Goals Acute Rehab OT Goals OT Goal Formulation: With patient Time For Goal Achievement: 11/04/11 Potential to Achieve Goals: Good ADL Goals Pt Will Perform Grooming: with supervision;Standing at sink ADL Goal: Grooming - Progress: Goal set today Pt Will Perform Lower Body Bathing: with supervision;Sit to stand from chair;Sit to stand from bed;with adaptive equipment ADL Goal: Lower Body Bathing - Progress: Goal set today Pt Will Perform Lower Body Dressing: with supervision;Sit to stand from chair;Sit to stand from bed;with adaptive equipment ADL Goal: Lower Body Dressing - Progress: Goal set today Pt Will Transfer to Toilet: with supervision;Ambulation;with DME;3-in-1 ADL Goal: Toilet Transfer - Progress: Goal set today Pt Will Perform Toileting - Clothing Manipulation: with supervision;Standing ADL Goal: Toileting - Clothing Manipulation - Progress: Goal set today Pt Will Perform Toileting - Hygiene: with supervision;with adaptive equipment;Sit to stand from  3-in-1/toilet ADL Goal: Toileting - Hygiene - Progress: Goal set today Pt Will Perform Tub/Shower Transfer: with min assist;with DME ADL Goal: Tub/Shower Transfer - Progress: Goal set today  Visit Information  Last OT Received On: 10/28/11 Assistance Needed: +1 PT/OT Co-Evaluation/Treatment: Yes    Subjective Data  Subjective: I was living with the  pain for over a year Patient Stated Goal: to be independent as possible.   Prior Functioning  Vision/Perception  Home Living Lives With: Alone Available Help at Discharge: Family Type of Home: House Home Access: Stairs to enter [employer] of Steps: 4 Entrance Stairs-Rails: Right;Left;Can reach both Home Layout: One level Bathroom Shower/Tub: Forensic scientist: Standard Home Adaptive Equipment: Walker - rolling;Shower chair with back;Raised toilet seat with rails;Crutches Additional Comments: Pt believes she has a 3in1 but will check Prior Function Level of Independence: Independent Communication Communication: No difficulties      Cognition  Overall Cognitive Status: Appears within functional limits for tasks assessed/performed Arousal/Alertness: Awake/alert Orientation Level: Appears intact for tasks assessed Behavior During Session: [facility] for tasks performed    Extremity/Trunk Assessment Right Upper Extremity Assessment RUE ROM/Strength/Tone: [facility] for tasks assessed Left Upper Extremity Assessment LUE ROM/Strength/Tone: WFL for tasks assessed   Mobility Bed Mobility Bed Mobility: Rolling Left;Left Sidelying to Sit Rolling Left: 4: Min guard Left Sidelying to Sit: 4: Min assist;HOB flat Details for Bed Mobility Assistance: verbal cues for technique with log roll.  Transfers Transfers: Sit to Stand;Stand to Sit Sit to Stand: 4: Min guard;From bed;From chair/3-in-1 Stand to Sit: 4: Min guard;To chair/3-in-1 Details for Transfer Assistance: verbal cues for hand placement and back  precautions.   Exercise    Balance    End of Session OT - End of Session Activity Tolerance: Patient limited by pain Patient left: in chair;with call bell/phone within reach  GO     Lennox Laity [phone] 10/28/2011, 12:11 PM

## 2011-10-29 ENCOUNTER — Encounter (INDEPENDENT_AMBULATORY_CARE_PROVIDER_SITE_OTHER): Payer: Medicare Other | Admitting: Surgery

## 2011-10-29 MED ORDER — HYDROCODONE-ACETAMINOPHEN 5-325 MG PO TABS: 1.0000 | ORAL_TABLET | Freq: Four times a day (QID) | ORAL | Status: DC | PRN

## 2011-10-29 MED ORDER — METHOCARBAMOL 500 MG PO TABS: 500.0000 mg | ORAL_TABLET | Freq: Four times a day (QID) | ORAL | Status: AC | PRN

## 2011-10-29 NOTE — Progress Notes (Addendum)
Occupational Therapy Treatment Patient Details Name: Carolyn Fowler MRN: [id number] DOB: 11/18/1945 Today's Date: 10/29/2011 Time: 4098-1191 OT Time Calculation (min): 20 min  OT Assessment / Plan / Recommendation Comments on Treatment Session Pt supposed to discharge home today. Recommend HHOT to followup.    Follow Up Recommendations  Home health OT;Supervision/Assistance - 24 hour    Barriers to Discharge       Equipment Recommendations  Rolling walker with 5" wheels;3 in 1 bedside comode;Other (comment) (wide RW needed due to body shape/size and back precautions)    Recommendations for Other Services    Frequency Min 2X/week   Plan Discharge plan needs to be updated    Precautions / Restrictions Precautions Precautions: Back Precaution Booklet Issued: Yes (comment) Precaution Comments: reviewed back precautions with pt Restrictions Weight Bearing Restrictions: No        ADL  Lower Body Dressing: Simulated;Supervision/safety;Other (comment) (cross legs up to simulate wash feet/don socks) Where Assessed - Lower Body Dressing: Supported sitting Toilet Transfer: Simulated;Min guard Toilet Transfer Method: Other (comment) (ambulating with RW) Tub/Shower Transfer: Simulated;Minimal assistance;Other (comment) (to simulate step over but pt unable to step out.) ADL Comments: Educated/reviewed all AE options. Pt able to cross legs up to simulate don socks/pants but it is still difficult. Pt aware of AE options available if needed and highly recommended toilet aid to perform toilet hygiene and adhere to precautions. Pt verbalized understanding of needing toilet aid. Pt simulated a step over tub to get in but then L LE wanted to buckle as she tried to simulate a step out of tub. Pt agreeable to OT recommendation to sponge bathe for safety initially and let HHOT assess tub transfer further.     OT Diagnosis:    OT Problem List:   OT Treatment Interventions:     OT Goals ADL  Goals ADL Goal: Lower Body Dressing - Progress: Progressing toward goals ADL Goal: Toilet Transfer - Progress: Progressing toward goals ADL Goal: Tub/Shower Transfer - Progress: Progressing toward goals  Visit Information  Last OT Received On: 10/29/11 Assistance Needed: +1    Subjective Data  Subjective: I think I will sponge bathe Patient Stated Goal: to get stronger   Prior Functioning       Cognition  Overall Cognitive Status: Appears within functional limits for tasks assessed/performed Arousal/Alertness: Awake/alert Orientation Level: Appears intact for tasks assessed Behavior During Session: [facility] for tasks performed    Mobility  Transfers Transfers: Stand to Sit Sit to Stand: 5: Supervision Stand to Sit: 4: Min guard;To chair/3-in-1 Details for Transfer Assistance: verbal cues to align with chair and hand placement. Pt states L LE feeling weak. Min guard for safety.   Exercises    Balance    End of Session OT - End of Session Activity Tolerance: Patient limited by pain Patient left: in chair;with call bell/phone within reach  GO     Lennox Laity [phone] 10/29/2011, 10:15 AM

## 2011-10-29 NOTE — Care Management Note (Signed)
    Page 1 of 2   10/29/2011     1:56:07 PM   CARE MANAGEMENT NOTE 10/29/2011  Patient:  Carolyn Fowler, Carolyn Fowler   Account Number:  [id number]  Date Initiated:  10/29/2011  Documentation initiated by:  Colleen Can  Subjective/Objective Assessment:   post op central decompressive lumbar laminectomy L3-4, foraminotomies L3-4 bilaterally, hemilaminectomy-right -L4     Action/Plan:   Cm spoke with patient and daughter. Plans are for patient to return to her home in [facility] where daughter will be caregiver. Genevieve Norlander will provide HHpt/ot and arrange for DME-wide rw and 3n1   Anticipated DC Date:  10/29/2011   Anticipated DC Plan:  HOME W HOME HEALTH SERVICES  In-house referral  Clinical Social Worker      DC Associate Professor  CM consult      PAC Choice  DURABLE MEDICAL EQUIPMENT  HOME HEALTH   Choice offered to / List presented to:  C-1 Patient   DME arranged  3-N-1  WALKER - WIDE      DME agency  [employer]     HH arranged  HH-2 PT  HH-3 OT      [facility] agency  [facility]   Status of service:  Completed, signed off Medicare Important Message given?  NA - LOS <3 / Initial given by admissions (If response is "NO", the following Medicare IM given date fields will be blank) Date Medicare IM given:   Date Additional Medicare IM given:    Discharge Disposition:  HOME W HOME HEALTH SERVICES  Per UR Regulation:    If discussed at Long Length of Stay Meetings, dates discussed:    Comments:  10/29/2011 Marlou Sa BSN CCM [phone] entiva will start  [facility] services 10/30/2011.  Anticipate discharge today

## 2011-10-29 NOTE — Progress Notes (Signed)
Subjective: Doing well today Will DC today.    Objective: Vital signs in last 24 hours: Temp:  [98 F (36.7 C)-102.1 F (38.9 C)] 99.2 F (37.3 C) (08/23 0525) Pulse Rate:  [63-79] 75  (08/23 0525) Resp:  [16-18] 16  (08/23 0525) BP: (103-139)/(59-77) 103/59 mmHg (08/23 0525) SpO2:  [92 %-96 %] 92 % (08/23 0525)  Intake/Output from previous day: 08/22 0701 - 08/23 0700 In: 1841.3 [P.O.:1200; I.V.:641.3] Out: 1225 [Urine:1225] Intake/Output this shift:    No results found for this basename: HGB:5 in the last 72 hours No results found for this basename: WBC:2,RBC:2,HCT:2,PLT:2 in the last 72 hours No results found for this basename: NA:2,K:2,CL:2,CO2:2,BUN:2,CREATININE:2,GLUCOSE:2,CALCIUM:2 in the last 72 hours No results found for this basename: LABPT:2,INR:2 in the last 72 hours  Neurologically intact  Assessment/Plan: DC Today.   , A 10/29/2011, 7:42 AM

## 2011-10-29 NOTE — Progress Notes (Signed)
Pt to d/c home. AVS reviewed. Pt has received her potty chair and walker. Pt capable of verbalizing medications and follow-up appointments. Remains hemodynamically stable. No signs and symptoms of distress. Educated pt to return to ER in the case of SOB, dizziness, or chest pain.

## 2011-10-29 NOTE — Progress Notes (Signed)
Physical Therapy Treatment Patient Details Name: Carolyn Fowler MRN: [id number] DOB: 02-Mar-1946 Today's Date: 10/29/2011 Time: 4098-1191 PT Time Calculation (min): 23 min  PT Assessment / Plan / Recommendation Comments on Treatment Session  Because of body habitus pt continues to struggle with transistions from sidely to sitting and would benefit from short term HHPT to progress to IND with mobility.  Based on body shape/size, pt would also benefit from wide RW - standard width RW promotes flexed posture as pt has difficulty stepping forward into it.    Follow Up Recommendations  Home health PT    Barriers to Discharge        Equipment Recommendations  Rolling walker with 5" wheels (wide RW needed 2* body shape/size and back precautions)    Recommendations for Other Services OT consult  Frequency 7X/week   Plan Discharge plan remains appropriate    Precautions / Restrictions Precautions Precautions: Back Precaution Booklet Issued: Yes (comment) Precaution Comments: Reviewed back care handout and all precautions with pt.  Restrictions Weight Bearing Restrictions: No   Pertinent Vitals/Pain     Mobility  Bed Mobility Bed Mobility: Rolling Left;Left Sidelying to Sit Rolling Left: 4: Min guard Left Sidelying to Sit: 4: Min assist;HOB flat Details for Bed Mobility Assistance: Verbal cues for technique, min assist to bring upper body off bed moving side ly to sit Transfers Transfers: Sit to Stand;Stand to Sit Sit to Stand: 5: Supervision Stand to Sit: 5: Supervision Details for Transfer Assistance: verbal cues for hand placement and back precautions. Ambulation/Gait Ambulation/Gait Assistance: 4: Min guard;5: Supervision Ambulation Distance (Feet): 200 Feet Assistive device: Rolling walker Ambulation/Gait Assistance Details: min cues for posture and position from RW Gait Pattern: Step-through pattern Gait velocity: slow and steady Stairs: Yes Stairs Assistance: 4:  Min assist Stairs Assistance Details (indicate cue type and reason): cues for sequence and hand placement on rails Stair Management Technique: Two rails;Step to pattern;Forwards Number of Stairs: 2     Exercises     PT Diagnosis:    PT Problem List:   PT Treatment Interventions:     PT Goals Acute Rehab PT Goals PT Goal Formulation: With patient Time For Goal Achievement: 11/01/11 Potential to Achieve Goals: Good Pt will go Supine/Side to Sit: with supervision PT Goal: Supine/Side to Sit - Progress: Progressing toward goal Pt will go Sit to Supine/Side: with supervision PT Goal: Sit to Supine/Side - Progress: Progressing toward goal Pt will go Sit to Stand: with supervision PT Goal: Sit to Stand - Progress: Progressing toward goal Pt will go Stand to Sit: with supervision PT Goal: Stand to Sit - Progress: Progressing toward goal Pt will Ambulate: >150 feet;with supervision;with rolling walker PT Goal: Ambulate - Progress: Progressing toward goal Pt will Go Up / Down Stairs: 3-5 stairs;with min assist;with rail(s) PT Goal: Up/Down Stairs - Progress: Progressing toward goal  Visit Information  Last PT Received On: 10/29/11 Assistance Needed: +1    Subjective Data  Subjective: I'm ok, just stiff Patient Stated Goal: Resume previous lifestyle with decreased pain   Cognition  Overall Cognitive Status: Appears within functional limits for tasks assessed/performed Arousal/Alertness: Awake/alert Orientation Level: Appears intact for tasks assessed Behavior During Session: [facility] for tasks performed    Balance     End of Session PT - End of Session Activity Tolerance: Patient tolerated treatment well Patient left: Other (comment) (bathroom) Nurse Communication: Mobility status   GP     , 10/29/2011, 9:55 AM

## 2011-11-02 ENCOUNTER — Encounter (HOSPITAL_COMMUNITY): Payer: Self-pay

## 2011-11-09 NOTE — Discharge Summary (Signed)
Physician Discharge Summary   Patient ID: Carolyn Fowler MRN: [id number] DOB/AGE: 1945/08/23 66 y.o.  Admit date: 10/27/2011 Discharge date: 10/29/2011  Primary Diagnosis:  Spinal stenosis, lumbar spine  Admission Diagnoses:  Past Medical History  Diagnosis Date  . Hypertension   . Dysrhythmia     occ palpitations- no cardiologist  . GERD (gastroesophageal reflux disease)   . Arthritis   . Anemia   . Neuromuscular disorder     right leg tumbness and tingling from herniated disc  . History of keloid of skin    Discharge Diagnoses:   Active Problems:  Spinal stenosis of lumbar region at multiple levels S/P decompressive lumbar laminectomy   Procedure:  Procedure(s) (LRB): DECOMPRESSIVE LUMBAR LAMINECTOMY LEVEL 2 (N/A)   Consults: None  HPI:  The patient is a 66 year old female. She has been seen by Dr. [name] for her back and leg pain for several months. CT myelogram showed she has a herniated disc at L3-4 and it's central.  She has  a stenosis of 3-4 adn L4-L5 as well. Trellis had an ESI by Dr. Ethelene Hal. She had relief for about a day but about 48 hours after the injection, she started to have the right leg pain and cramps that she had previously.  She reports that she gets severe cramps about 3 times a day but throughout the day she does have some constant pain, as well.  No particular activity seems to bring this on.  It can happen with sitting or standing.  She does have some numbness and tingling in the right foot.  She has noticed that, at times, due to the pain in her right leg and back, she is having some trouble with balance and walking. She needs to have a central decompressive lumbar laminectomy and microdiscectomy at L3-4.    Laboratory Data: Hospital Outpatient Visit on 10/21/2011  Component Date Value Range Status  . aPTT 10/21/2011 40* 24 - 37 seconds Final   Comment:                                 IF BASELINE aPTT IS ELEVATED,   SUGGEST PATIENT RISK ASSESSMENT                          BE USED TO DETERMINE APPROPRIATE                          ANTICOAGULANT THERAPY.  . WBC 10/21/2011 6.4  4.0 - 10.5 K/uL Final  . RBC 10/21/2011 5.48* 3.87 - 5.11 MIL/uL Final  . Hemoglobin 10/21/2011 12.9  12.0 - 15.0 g/dL Final  . HCT 78/29/5621 40.7  36.0 - 46.0 % Final  . MCV 10/21/2011 74.3* 78.0 - 100.0 fL Final  . MCH 10/21/2011 23.5* 26.0 - 34.0 pg Final  . MCHC 10/21/2011 31.7  30.0 - 36.0 g/dL Final  . RDW 30/86/5784 15.0  11.5 - 15.5 % Final  . Platelets 10/21/2011 251  150 - 400 K/uL Final  . Sodium 10/21/2011 139  135 - 145 mEq/L Final  . Potassium 10/21/2011 3.8  3.5 - 5.1 mEq/L Final  . Chloride 10/21/2011 102  96 - 112 mEq/L Final  . CO2 10/21/2011 31  19 - 32 mEq/L Final  . Glucose, Bld 10/21/2011 93  70 - 99 mg/dL Final  . BUN 69/62/9528 14  6 -  23 mg/dL Final  . Creatinine, Ser 10/21/2011 0.65  0.50 - 1.10 mg/dL Final  . Calcium 08/65/7846 9.6  8.4 - 10.5 mg/dL Final  . Total Protein 10/21/2011 7.0  6.0 - 8.3 g/dL Final  . Albumin 96/29/5284 3.7  3.5 - 5.2 g/dL Final  . AST 13/24/4010 14  0 - 37 U/L Final  . ALT 10/21/2011 15  0 - 35 U/L Final  . Alkaline Phosphatase 10/21/2011 81  39 - 117 U/L Final  . Total Bilirubin 10/21/2011 0.5  0.3 - 1.2 mg/dL Final  . GFR calc non Af Amer 10/21/2011 >90  >90 mL/min Final  . GFR calc Af Amer 10/21/2011 >90  >90 mL/min Final   Comment:                                 The eGFR has been calculated                          using the CKD EPI equation.                          This calculation has not been                          validated in all clinical                          situations.                          eGFR's persistently                          <90 mL/min signify                          possible Chronic Kidney Disease.  Marland Kitchen Neutrophils Relative 10/21/2011 44  43 - 77 % Final  . Neutro Abs 10/21/2011 2.9  1.7 - 7.7 K/uL Final  . Lymphocytes Relative  10/21/2011 48* 12 - 46 % Final  . Lymphs Abs 10/21/2011 3.1  0.7 - 4.0 K/uL Final  . Monocytes Relative 10/21/2011 6  3 - 12 % Final  . Monocytes Absolute 10/21/2011 0.4  0.1 - 1.0 K/uL Final  . Eosinophils Relative 10/21/2011 2  0 - 5 % Final  . Eosinophils Absolute 10/21/2011 0.1  0.0 - 0.7 K/uL Final  . Basophils Relative 10/21/2011 1  0 - 1 % Final  . Basophils Absolute 10/21/2011 0.0  0.0 - 0.1 K/uL Final  . Prothrombin Time 10/21/2011 13.0  11.6 - 15.2 seconds Final  . INR 10/21/2011 0.96  0.00 - 1.49 Final  . Color, Urine 10/21/2011 YELLOW  YELLOW Final  . APPearance 10/21/2011 CLOUDY* CLEAR Final  . Specific Gravity, Urine 10/21/2011 1.016  1.005 - 1.030 Final  . pH 10/21/2011 7.0  5.0 - 8.0 Final  . Glucose, UA 10/21/2011 NEGATIVE  NEGATIVE mg/dL Final  . Hgb urine dipstick 10/21/2011 NEGATIVE  NEGATIVE Final  . Bilirubin Urine 10/21/2011 NEGATIVE  NEGATIVE Final  . Ketones, ur 10/21/2011 NEGATIVE  NEGATIVE mg/dL Final  . Protein, ur 27/25/3664 NEGATIVE  NEGATIVE mg/dL Final  . Urobilinogen, UA 10/21/2011 0.2  0.0 - 1.0 mg/dL Final  .  Nitrite 10/21/2011 POSITIVE* NEGATIVE Final  . Leukocytes, UA 10/21/2011 MODERATE* NEGATIVE Final  . MRSA, PCR 10/21/2011 NEGATIVE  NEGATIVE Final  . Staphylococcus aureus 10/21/2011 NEGATIVE  NEGATIVE Final   Comment:                                 The Xpert SA Assay (FDA                          approved for NASAL specimens                          in patients over 85 years of age),                          is one component of                          a comprehensive surveillance                          program.  Test performance has                          been validated by [employer] for patients greater                          than or equal to 91 year old.                          It is not intended                          to diagnose infection nor to                          guide or monitor treatment.    . Squamous Epithelial / LPF 10/21/2011 FEW* RARE Final  . WBC, UA 10/21/2011 7-10  <3 WBC/hpf Final  . RBC / HPF 10/21/2011 0-2  <3 RBC/hpf Final  . Bacteria, UA 10/21/2011 MANY* RARE Final    X-Rays:Dg Chest 2 View  10/21/2011  *RADIOLOGY REPORT*  Clinical Data: Preoperative evaluation.  No chest pain or shortness of breath.  Hypertension.  Nonsmoker  CHEST - 2 VIEW  Comparison: 09/13/2009  Findings: Heart size is mildly enlarged and is stable.  Aortic ectasia is also again noted, stable in degree.  The lung fields appear clear with no signs of focal infiltrate or congestive failure.  No pleural fluid or significant peribronchial cuffing is identified.  Bony structures demonstrate degenerative changes of the mid and lower thoracic spine and are otherwise intact.  IMPRESSION: Stable cardiomegaly and aortic ectasia.  No new focal or acute cardiopulmonary abnormality identified.  Original Report Authenticated By: Bertha Stakes, M.D.   Dg Lumbar Spine 2-3 Views  10/21/2011  *RADIOLOGY REPORT*  Clinical Data: Preop lumbar surgery.  Please level lumbar spine levels.  LUMBAR SPINE - 2-3 VIEW  Comparison: CT myelogram 06/18/2011.  Findings: Lumbar  spine levels are numbered, her at the recent CT lumbar myelogram.  There are five lumbar-type vertebral bodies. There is stable grade 1 anterolisthesis of L4 on L5 and stable grade 1 anterolisthesis of L5 on S1.  The remainder the lumbar spine vertebral bodies are normally aligned.  The vertebral bodies are normal in height.  There is disc space narrowing at L5-S1 with vacuum disc phenomenon.  No acute bony abnormalities identified.  IMPRESSION: Five lumbar-type vertebral bodies are labeled in accordance to the recent CT myelogram.  No acute bony abnormality.  Stable grade 1 anterolisthesis at L4-5 and L5-S1 and disc space narrowing at L5- S1.  Original Report Authenticated By: Britta Mccreedy, M.D.   Dg Spine Portable 1 View  10/27/2011  *RADIOLOGY REPORT*   Clinical Data: L3-4 and possibly L4-5 discectomy.  PORTABLE SPINE - 1 VIEW  Comparison: Intraoperative exam performed the same date. 10/21/2011 plain film exam.  Post myelogram CT 06/18/2011.  Findings: None was labeled #3.  Metallic rakes posterior to the L3 level.  Two sets of surgical spreaders.  Upper surgical spreaders posterior to the L3-4 disc space.  The lower surgical spreaders posterior to the L4-5 disc space.  Superiorly located metallic probe overlies the L4 pedicle.  Inferiorly located metallic probe is directed superiorly and overlies the posterior aspect of the L3 vertebra.  Curvilinear radiopaque material posterior to the L4 pedicle.  IMPRESSION: Metallic rakes posterior to the L3 level.  Two sets of surgical spreaders.  Upper surgical spreaders posterior to the L3-4 disc space.  The lower surgical spreaders posterior to the L4-5 disc space.  Superiorly located metallic probe overlies the L4 pedicle.  Inferiorly located metallic probe is directed superiorly and overlies the posterior aspect of the L3 vertebra.  Curvilinear radiopaque material posterior to the L4 pedicle.  This is a call report.   Original Report Authenticated By: Fuller Canada, M.D.    Dg Spine Portable 1 View  10/27/2011  *RADIOLOGY REPORT*  Clinical Data: L3-4 and possibly L4-5 fusion.  PORTABLE SPINE - 1 VIEW  Comparison: Post myelogram CT 06/18/2011.  Intraoperative lateral views same date.  Findings: Superior clamp L3 spinous process level.  Inferior clamp L4 spinous process level.  Surgical sponges in place.  IMPRESSION: Superior clamp L3 spinous process level.  Inferior clamp L4 spinous process level.  Surgical sponges in place.  This is a call report.   Original Report Authenticated By: Fuller Canada, M.D.    Dg Spine Portable 1 View  10/27/2011  *RADIOLOGY REPORT*  Clinical Data: Intraoperative radiograph from lumbar localization.  PORTABLE SPINE - 1 VIEW  Comparison: 10/21/2011  Findings: Compared to the numbering  from prior CT scan and prior recent radiographs the upper needle type probe localizes the tip the spinous process of L2, and the lower needle probe localizes the tip of the spinous process of L3.  IMPRESSION:  1.  Upper probe localizes the tip the spinous process of L2; the lower needle type probe localizes the tip the spinous process of L3.   Original Report Authenticated By: Dellia Cloud, M.D.     EKG: Orders placed during the hospital encounter of 10/27/11  . EKG     Hospital Course: Patient was admitted to [facility] and taken to the OR and underwent the above state procedure without complications.  Patient tolerated the procedure well and was later transferred to the recovery room and then to the orthopaedic floor for postoperative care.  They were given PO  and IV analgesics for pain control following their surgery.    PT and OT were ordered.  Discharge planning consulted to help with postop disposition and equipment needs.  Patient had a good night on the evening of surgery and started to get up OOB with therapy on day one.  Continued to work with therapy into day two.  Dressing was changed on day two and the incision was clean and dry. Leg pain and weakness resolved. Patient was seen in rounds and was ready to go home.  Discharge Medications: Prior to Admission medications   Medication Sig Start Date End Date Taking? Authorizing Provider  hydrochlorothiazide (HYDRODIURIL) 25 MG tablet Take 25 mg by mouth daily after breakfast.   Yes Historical Provider, MD  losartan (COZAAR) 100 MG tablet Take 100 mg by mouth daily with breakfast.   Yes Historical Provider, MD  metoprolol (LOPRESSOR) 50 MG tablet Take 1 tablet (50 mg total) by mouth 2 (two) times daily. 07/01/11  Yes Jacques Navy, MD  HYDROcodone-acetaminophen (NORCO/VICODIN) 5-325 MG per tablet Take 1-2 tablets by mouth every 6 (six) hours as needed. Pain         8/15- states has new RX 10/29/11    Tamala Ser,  PA  methocarbamol (ROBAXIN) 500 MG tablet Take 1 tablet (500 mg total) by mouth every 6 (six) hours as needed. 10/29/11 11/08/11   Tamala Ser, PA  Multiple Vitamin (MULTIVITAMIN WITH MINERALS) TABS Take 1 tablet by mouth daily.    Historical Provider, MD    Diet: Regular diet Activity:WBAT Follow-up:in 2 weeks Disposition - Home Discharged Condition: good   Discharge Orders    Future Appointments: Provider: Department: Dept Phone: Center:   12/07/2011 1:50 PM Currie Paris, MD [facility] [phone] None     Future Orders Please Complete By Expires   Diet - low sodium heart healthy      Call MD / Call 911      Comments:   If you experience chest pain or shortness of breath, CALL 911 and be transported to the hospital emergency room.  If you develope a fever above 101 F, pus (white drainage) or increased drainage or redness at the wound, or calf pain, call your surgeon's office.   Constipation Prevention      Comments:   Drink plenty of fluids.  Prune juice may be helpful.  You may use a stool softener, such as Colace (over the counter) 100 mg twice a day.  Use MiraLax (over the counter) for constipation as needed.   Increase activity slowly as tolerated      Discharge instructions      Comments:   Change your dressing daily. Shower only, no tub bath. Call if any temperatures greater than 101 or any wound complications: [phone] during the day and ask for Dr. Jeannetta Ellis nurse, [facility].   Lifting restrictions      Comments:   No lifting for 2 weeks   Driving restrictions      Comments:   No driving for 2 weeks     Medication List  As of 11/09/2011 12:34 PM   STOP taking these medications         aspirin EC 81 MG tablet      losartan 50 MG tablet         TAKE these medications         hydrochlorothiazide 25 MG tablet   Commonly known as: HYDRODIURIL   Take 25 mg by mouth daily after  breakfast.      HYDROcodone-acetaminophen 5-325 MG per tablet    Commonly known as: NORCO/VICODIN   Take 1-2 tablets by mouth every 6 (six) hours as needed. Pain         8/15- states has new RX      losartan 100 MG tablet   Commonly known as: COZAAR   Take 100 mg by mouth daily with breakfast.      metoprolol 50 MG tablet   Commonly known as: LOPRESSOR   Take 1 tablet (50 mg total) by mouth 2 (two) times daily.      multivitamin with minerals Tabs   Take 1 tablet by mouth daily.             SignedCeledonio Savage,  LAUREN 11/09/2011, 12:34 PM

## 2011-11-17 ENCOUNTER — Telehealth: Payer: Self-pay | Admitting: *Deleted

## 2011-11-17 NOTE — Telephone Encounter (Signed)
PHYSICAL THERAPIST, MARY, [facility], SAW PATIENT THIS MORNING . PATIENT STATED SHE HAD FORGOT TO TAKE HER BLOOD PRESSURE MEDICINE  I LAST NITE BUT TOOK IT THIS MORNING WITH HER OTHER MEDICINES, BLOOD PRESSURE AT VISIT 11;40 WAS 146/102   HR 66. NOTED SOME PALPATIONS THIS AM BUT OK NOW. THE NURSE WILL VISIT THIS AFTERNOON AT 2 PM WITH PATIENT TO FOLLOW UP .

## 2011-11-17 NOTE — Telephone Encounter (Signed)
noted 

## 2011-11-22 ENCOUNTER — Telehealth: Payer: Self-pay | Admitting: Internal Medicine

## 2011-11-22 NOTE — Telephone Encounter (Signed)
Pt has blood pressure reading of 160/110, please call maridee back to advise how this should be treated

## 2011-11-23 NOTE — Telephone Encounter (Signed)
Carolyn Fowler from [facility] is calling back today to give the patients blood pressure reading for 158/98 and to see what treatment Dr. Alvera Novel would like to start. The patient is unable to do her physical therapy when her Blood pressure is this high  Call Carolyn Fowler at [phone]

## 2011-11-23 NOTE — Telephone Encounter (Signed)
She is on triple drug therapy! At last ov SBP 103, reviewing past 12 months she ranges as high aas 150 and as low as 103.   Will not make any changes in med list at this time. Patient needs ov for med change. It is medically ok for her to do PT if SBP <160, DBP <100

## 2011-11-23 NOTE — Telephone Encounter (Signed)
Spoke with Kern Alberta, notified of Dr, Debby Bud response and orders concerning patient Blood Pressure readings and  PT orders for Blood Pressure readings

## 2011-12-07 ENCOUNTER — Encounter (INDEPENDENT_AMBULATORY_CARE_PROVIDER_SITE_OTHER): Payer: Self-pay | Admitting: Surgery

## 2011-12-07 ENCOUNTER — Ambulatory Visit (INDEPENDENT_AMBULATORY_CARE_PROVIDER_SITE_OTHER): Payer: Medicare Other | Admitting: Surgery

## 2011-12-07 VITALS — BP 146/96 | HR 80 | Temp 98.0°F | Resp 18 | Ht 63.0 in | Wt 250.0 lb

## 2011-12-07 DIAGNOSIS — N6019 Diffuse cystic mastopathy of unspecified breast: Secondary | ICD-10-CM

## 2011-12-07 NOTE — Progress Notes (Signed)
Chief complaint: Followup Dr. Phebe Colla left breast upper-outer quadrant  History of present illness: I saw the patient several months ago because of some nodular density in the upper-outer quadrant of left breast. At the time I thought this was benign but asked her to come back for followup. She thinks the area has gotten smaller she is not having any symptoms.  Past medical history: No change since she was here previously as is his that had some back surgery.  Exam: Vital signs:BP 146/96  Pulse 80  Temp 98 F (36.7 C) (Temporal)  Resp 18  Ht 5\' 3"  (1.6 m)  Wt 250 lb (113.399 kg)  BMI 44.29 kg/m2  SpO2 96% General: Patient alert oriented healthy-appearing but walks slowly due to the recent back surgery and is using a cane. Breasts: Symmetric. Very large. There remains some nodular density superficially in the left breast upper outer quadrant fairly lateral. This feels like benign fibrocystic change. There is no dominant mass otherwise.  Impression: Stable fibrocystic changes  Plan:I think she just needs routine followups and mammograms. She'll come back if she notices any other changes in her breasts.

## 2011-12-07 NOTE — Patient Instructions (Signed)
If you notice any other changes in your breasts, back to see me again

## 2012-01-18 ENCOUNTER — Ambulatory Visit (INDEPENDENT_AMBULATORY_CARE_PROVIDER_SITE_OTHER): Payer: Medicare Other | Admitting: Internal Medicine

## 2012-01-18 ENCOUNTER — Encounter: Payer: Self-pay | Admitting: Internal Medicine

## 2012-01-18 VITALS — BP 152/80 | HR 66 | Temp 98.1°F | Resp 22 | Ht 64.0 in | Wt 257.5 lb

## 2012-01-18 DIAGNOSIS — N3090 Cystitis, unspecified without hematuria: Secondary | ICD-10-CM

## 2012-01-18 MED ORDER — SULFAMETHOXAZOLE-TMP DS 800-160 MG PO TABS: 1.0000 | ORAL_TABLET | Freq: Two times a day (BID) | ORAL | Status: DC

## 2012-01-18 NOTE — Patient Instructions (Addendum)
Cystitis - symptoms are suggestive of early bladder infection and the urinalysis was weakly positive for infection.  Plan   Septra DS (generic) twice a day for 3 days.   Urinary Tract Infection Urinary tract infections (UTIs) can develop anywhere along your urinary tract. Your urinary tract is your body's drainage system for removing wastes and extra water. Your urinary tract includes two kidneys, two ureters, a bladder, and a urethra. Your kidneys are a pair of bean-shaped organs. Each kidney is about the size of your fist. They are located below your ribs, one on each side of your spine. CAUSES Infections are caused by microbes, which are microscopic organisms, including fungi, viruses, and bacteria. These organisms are so small that they can only be seen through a microscope. Bacteria are the microbes that most commonly cause UTIs. SYMPTOMS   Symptoms of UTIs may vary by age and gender of the patient and by the location of the infection. Symptoms in young women typically include a frequent and intense urge to urinate and a painful, burning feeling in the bladder or urethra during urination. Older women and men are more likely to be tired, shaky, and weak and have muscle aches and abdominal pain. A fever may mean the infection is in your kidneys. Other symptoms of a kidney infection include pain in your back or sides below the ribs, nausea, and vomiting. DIAGNOSIS To diagnose a UTI, your caregiver will ask you about your symptoms. Your caregiver also will ask to provide a urine sample. The urine sample will be tested for bacteria and white blood cells. White blood cells are made by your body to help fight infection. TREATMENT   Typically, UTIs can be treated with medication. Because most UTIs are caused by a bacterial infection, they usually can be treated with the use of antibiotics. The choice of antibiotic and length of treatment depend on your symptoms and the type of bacteria causing your  infection. HOME CARE INSTRUCTIONS  If you were prescribed antibiotics, take them exactly as your caregiver instructs you. Finish the medication even if you feel better after you have only taken some of the medication.   Drink enough water and fluids to keep your urine clear or pale yellow.   Avoid caffeine, tea, and carbonated beverages. They tend to irritate your bladder.   Empty your bladder often. Avoid holding urine for long periods of time.   Empty your bladder before and after sexual intercourse.   After a bowel movement, women should cleanse from front to back. Use each tissue only once.  SEEK MEDICAL CARE IF:    You have back pain.   You develop a fever.   Your symptoms do not begin to resolve within 3 days.  SEEK IMMEDIATE MEDICAL CARE IF:    You have severe back pain or lower abdominal pain.   You develop chills.   You have nausea or vomiting.   You have continued burning or discomfort with urination.  MAKE SURE YOU:    Understand these instructions.   Will watch your condition.   Will get help right away if you are not doing well or get worse.  Document Released: 12/02/2004 Document Revised: 08/24/2011 Document Reviewed: 04/02/2011 [facility] Patient Information 2013 [place], [place].

## 2012-01-18 NOTE — Progress Notes (Signed)
  Subjective:    Patient ID: Carolyn Fowler, female    DOB: 1945/03/14, 66 y.o.   MRN: [id number]  HPI Carolyn Fowler present c/o Urinary urgency, strong odor to he urine and post-void bladder spasm.  No fever or chills, no back pain or lower abdominal pain.  PMH, FamHx and SocHx reviewed for any changes and relevance.  Current Outpatient Prescriptions on File Prior to Visit  Medication Sig Dispense Refill  . hydrochlorothiazide (HYDRODIURIL) 25 MG tablet Take 25 mg by mouth daily after breakfast.      . losartan (COZAAR) 100 MG tablet Take 100 mg by mouth daily with breakfast.      . metoprolol (LOPRESSOR) 50 MG tablet Take 1 tablet (50 mg total) by mouth 2 (two) times daily.  180 tablet  3  . Multiple Vitamin (MULTIVITAMIN WITH MINERALS) TABS Take 1 tablet by mouth daily.      Marland Kitchen HYDROcodone-acetaminophen (NORCO/VICODIN) 5-325 MG per tablet Take 1-2 tablets by mouth every 6 (six) hours as needed. Pain         8/15- states has new RX  60 tablet  1      Review of Systems System review is negative for any constitutional, cardiac, pulmonary, GI or neuro symptoms or complaints other than as described in the HPI.     Objective:   Physical Exam Filed Vitals:   01/18/12 1146  BP: 152/80  Pulse: 66  Temp: 98.1 F (36.7 C)  Resp: 22   Wt Readings from Last 3 Encounters:  01/18/12 257 lb 8 oz (116.801 kg)  12/07/11 250 lb (113.399 kg)  10/27/11 250 lb (113.399 kg)   Gen'l- overweight AA woman Flank - not tender to percussion  U/A dip - moderate leukocyte esterase       Assessment & Plan:  Cystitis - early symptoms and abnormal dip U/A  Plan - Septra DS BID x 3

## 2012-06-19 ENCOUNTER — Ambulatory Visit (INDEPENDENT_AMBULATORY_CARE_PROVIDER_SITE_OTHER)
Admission: RE | Admit: 2012-06-19 | Discharge: 2012-06-19 | Disposition: A | Discharge status: Home / Self Care | Payer: Medicare Other | Source: Ambulatory Visit | Attending: Internal Medicine | Admitting: Internal Medicine

## 2012-06-19 ENCOUNTER — Ambulatory Visit (INDEPENDENT_AMBULATORY_CARE_PROVIDER_SITE_OTHER): Payer: Medicare Other | Admitting: Internal Medicine

## 2012-06-19 ENCOUNTER — Encounter: Payer: Self-pay | Admitting: Internal Medicine

## 2012-06-19 ENCOUNTER — Other Ambulatory Visit: Payer: Self-pay

## 2012-06-19 DIAGNOSIS — R0789 Other chest pain: Secondary | ICD-10-CM

## 2012-06-19 DIAGNOSIS — R071 Chest pain on breathing: Secondary | ICD-10-CM

## 2012-06-19 DIAGNOSIS — K7689 Other specified diseases of liver: Secondary | ICD-10-CM | POA: Insufficient documentation

## 2012-06-19 MED ORDER — HYDROCODONE-ACETAMINOPHEN 5-325 MG PO TABS: 1.0000 | ORAL_TABLET | Freq: Four times a day (QID) | ORAL | Status: DC | PRN
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
## 2012-06-19 NOTE — Assessment & Plan Note (Signed)
Repeat CT in 6 mo
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
## 2012-06-20 ENCOUNTER — Encounter: Payer: Self-pay | Admitting: Internal Medicine
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
## 2012-08-13 ENCOUNTER — Other Ambulatory Visit: Payer: Self-pay | Admitting: Internal Medicine

## 2012-09-05 ENCOUNTER — Other Ambulatory Visit: Payer: Self-pay | Admitting: Internal Medicine

## 2012-09-18 ENCOUNTER — Ambulatory Visit: Payer: Medicare Other | Admitting: Internal Medicine

## 2012-11-07 ENCOUNTER — Other Ambulatory Visit (INDEPENDENT_AMBULATORY_CARE_PROVIDER_SITE_OTHER): Payer: Medicare Other

## 2012-11-07 ENCOUNTER — Encounter: Payer: Self-pay | Admitting: Internal Medicine

## 2012-11-07 ENCOUNTER — Ambulatory Visit (INDEPENDENT_AMBULATORY_CARE_PROVIDER_SITE_OTHER): Payer: Medicare Other | Admitting: Internal Medicine

## 2012-11-07 VITALS — BP 140/90 | HR 77 | Temp 98.1°F | Ht 63.25 in | Wt 256.8 lb

## 2012-11-07 DIAGNOSIS — I10 Essential (primary) hypertension: Secondary | ICD-10-CM

## 2012-11-07 DIAGNOSIS — Z0000 Encounter for general adult medical examination without abnormal findings: Secondary | ICD-10-CM

## 2012-11-07 DIAGNOSIS — Z86010 Personal history of colonic polyps: Secondary | ICD-10-CM

## 2012-11-07 DIAGNOSIS — M48061 Spinal stenosis, lumbar region without neurogenic claudication: Secondary | ICD-10-CM

## 2012-11-07 LAB — COMPREHENSIVE METABOLIC PANEL
AST: 15 U/L (ref 0–37)
Albumin: 3.9 g/dL (ref 3.5–5.2)
BUN: 14 mg/dL (ref 6–23)
Calcium: 9 mg/dL (ref 8.4–10.5)
Chloride: 99 mEq/L (ref 96–112)
Glucose, Bld: 88 mg/dL (ref 70–99)
Potassium: 3.2 mEq/L — ABNORMAL LOW (ref 3.5–5.1)
Sodium: 136 mEq/L (ref 135–145)
Total Protein: 7.2 g/dL (ref 6.0–8.3)

## 2012-11-07 LAB — LIPID PANEL
Cholesterol: 163 mg/dL (ref 0–200)
Total CHOL/HDL Ratio: 3
Triglycerides: 96 mg/dL (ref 0.0–149.0)

## 2012-11-07 MED ORDER — METOPROLOL TARTRATE 50 MG PO TABS: 50.0000 mg | ORAL_TABLET | Freq: Two times a day (BID) | ORAL | Status: DC

## 2012-11-07 MED ORDER — HYDROCHLOROTHIAZIDE 25 MG PO TABS: 25.0000 mg | ORAL_TABLET | Freq: Every day | ORAL | Status: DC

## 2012-11-07 MED ORDER — LOSARTAN POTASSIUM 100 MG PO TABS: 100.0000 mg | ORAL_TABLET | Freq: Every day | ORAL | Status: DC
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
## 2012-11-09 NOTE — Assessment & Plan Note (Signed)
BP Readings from Last 3 Encounters:  11/07/12 140/90  06/19/12 140/100  01/18/12 152/80   Borderline control.  Plan Continue present medication  Weight loss and exercise
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
## 2012-12-15 ENCOUNTER — Encounter: Payer: Self-pay | Admitting: Internal Medicine

## 2013-07-27 IMAGING — CR DG CHEST 2V
2 series · 2 of 2 positions shown · non-contrast
Comparison: 09/13/2009

CLINICAL DATA: Preoperative evaluation.  No chest pain or shortness
of breath.  Hypertension.  Nonsmoker

CHEST - 2 VIEW

[w chest pa]
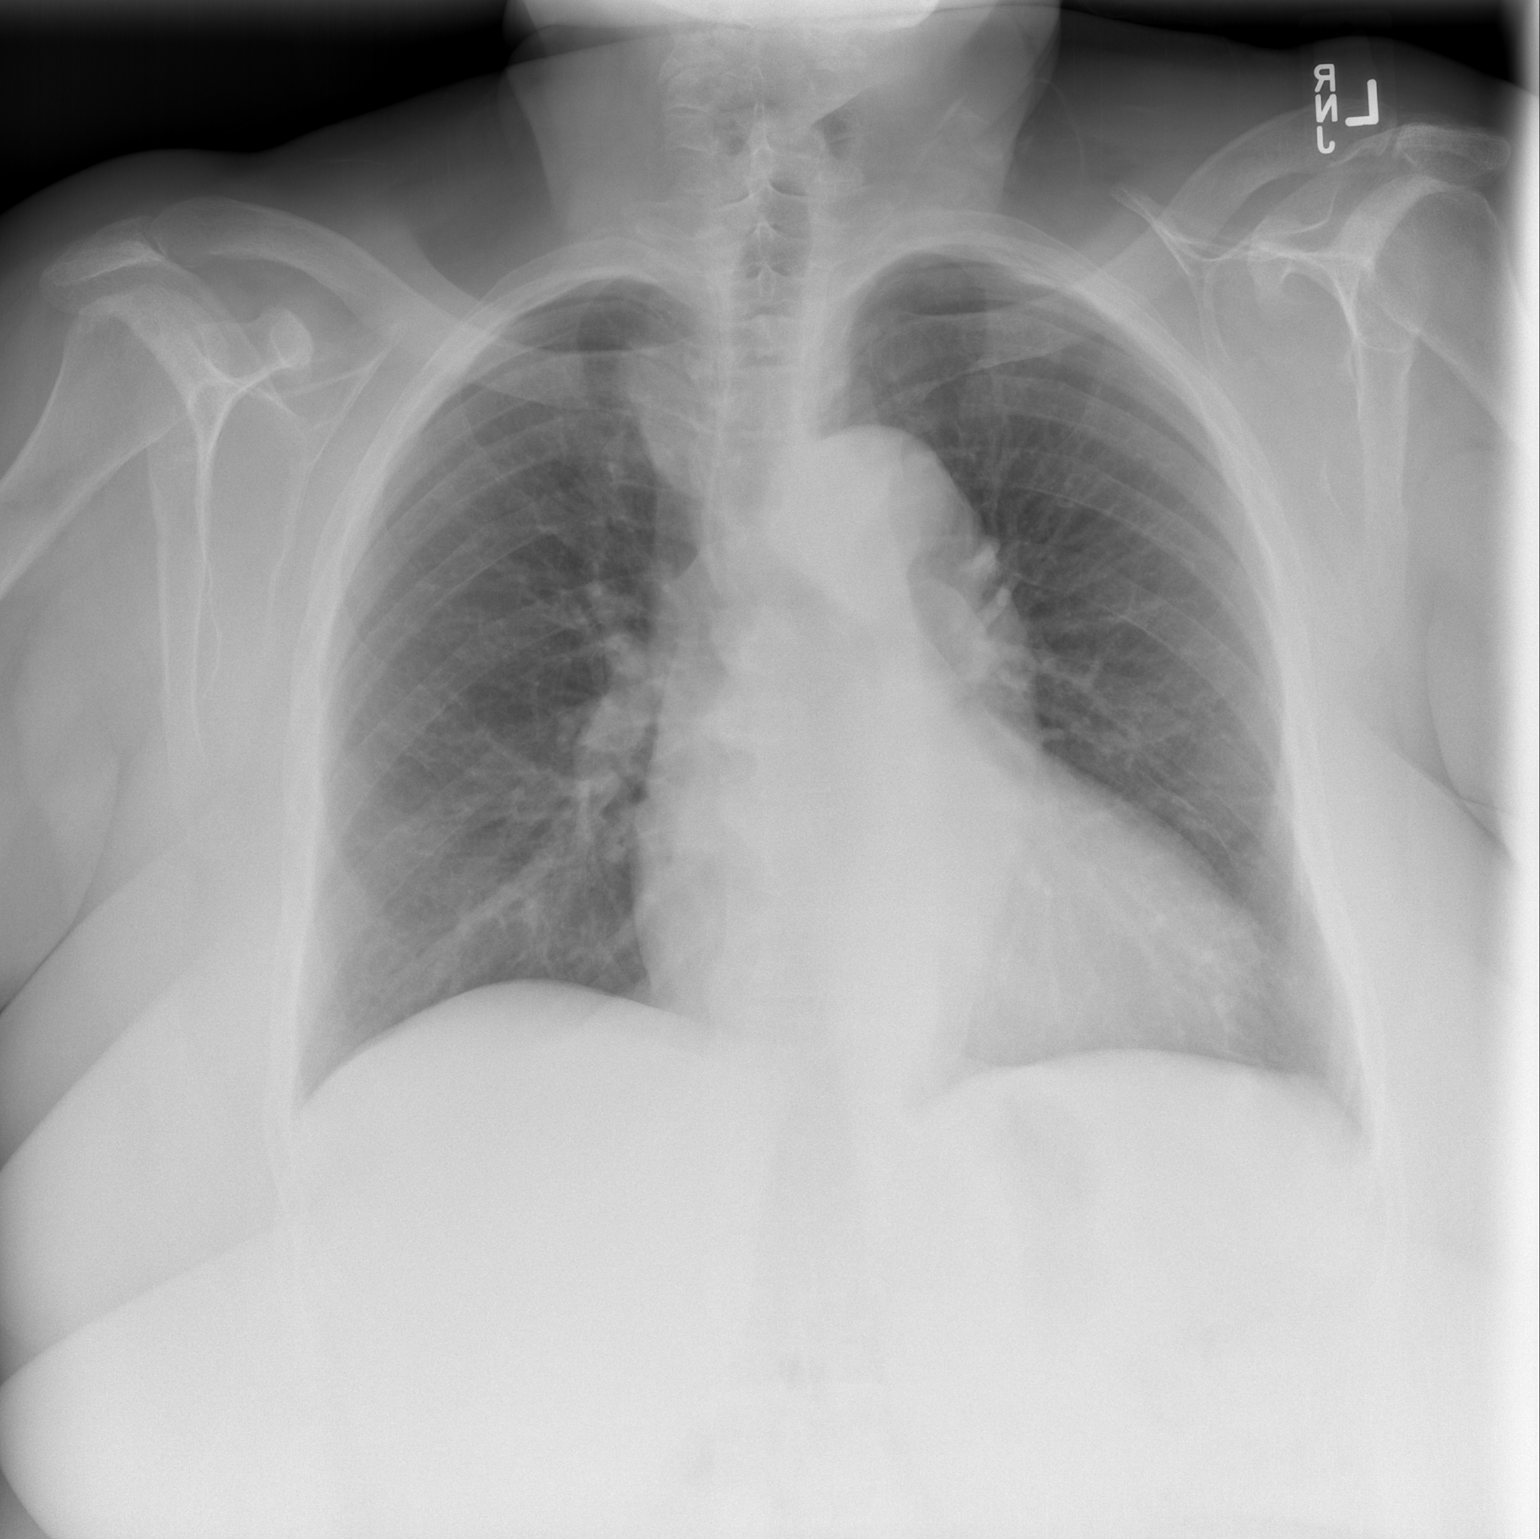

[w chest lat]
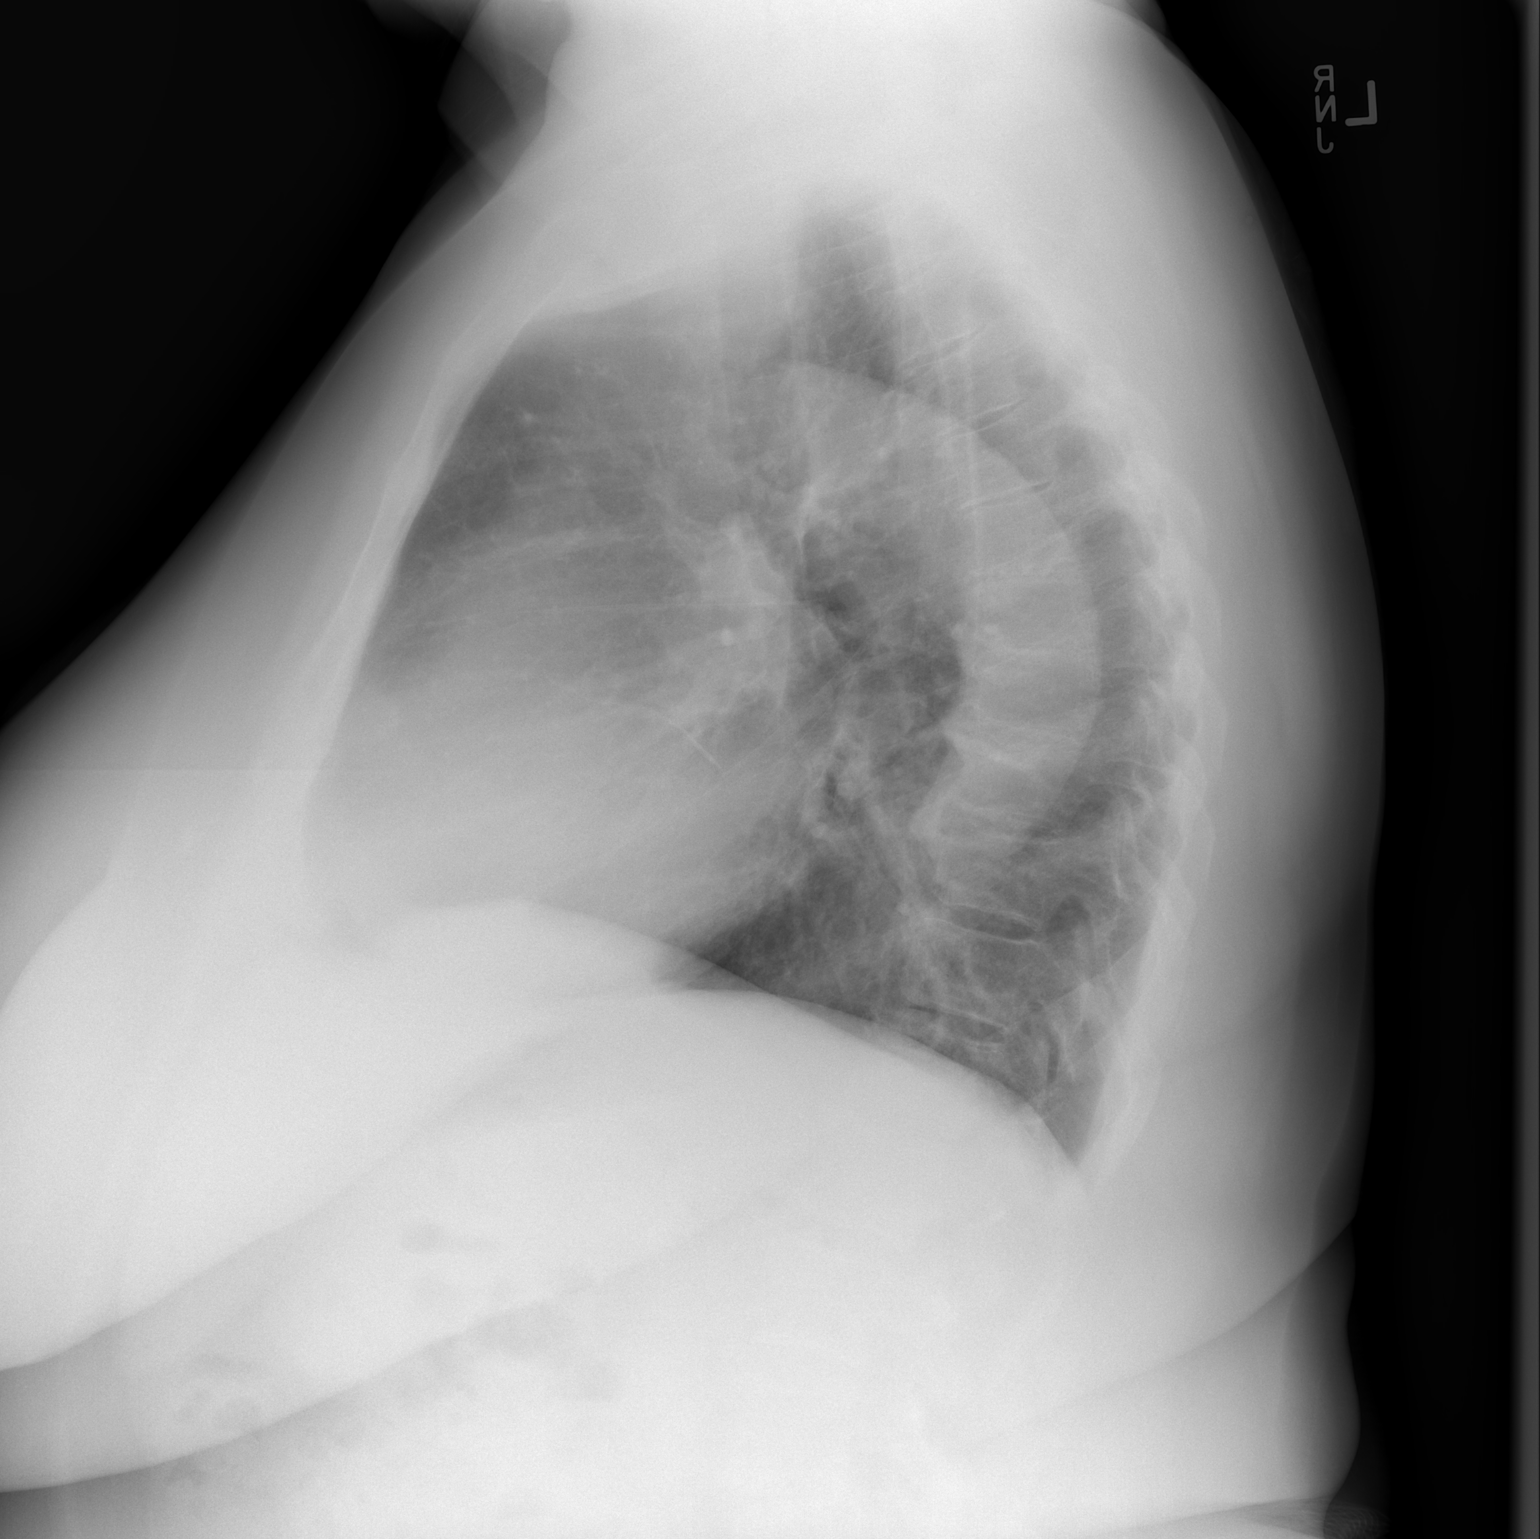

[2 of 2 positions shown; findings below may reference images not displayed]

FINDINGS: Heart size is mildly enlarged and is stable.  Aortic
ectasia is also again noted, stable in degree.

The lung fields appear clear with no signs of focal infiltrate or
congestive failure.  No pleural fluid or significant peribronchial
cuffing is identified.

Bony structures demonstrate degenerative changes of the mid and
lower thoracic spine and are otherwise intact.
IMPRESSION: Stable cardiomegaly and aortic ectasia.  No new focal or acute
cardiopulmonary abnormality identified.

## 2013-08-02 IMAGING — CR DG SPINE 1V PORT
1 series · 1 of 1 positions shown · non-contrast
Comparison: Post myelogram CT 06/18/2011.  Intraoperative lateral
views same date.

CLINICAL DATA: L3-4 and possibly L4-5 fusion.

PORTABLE SPINE - 1 VIEW

[lateral]
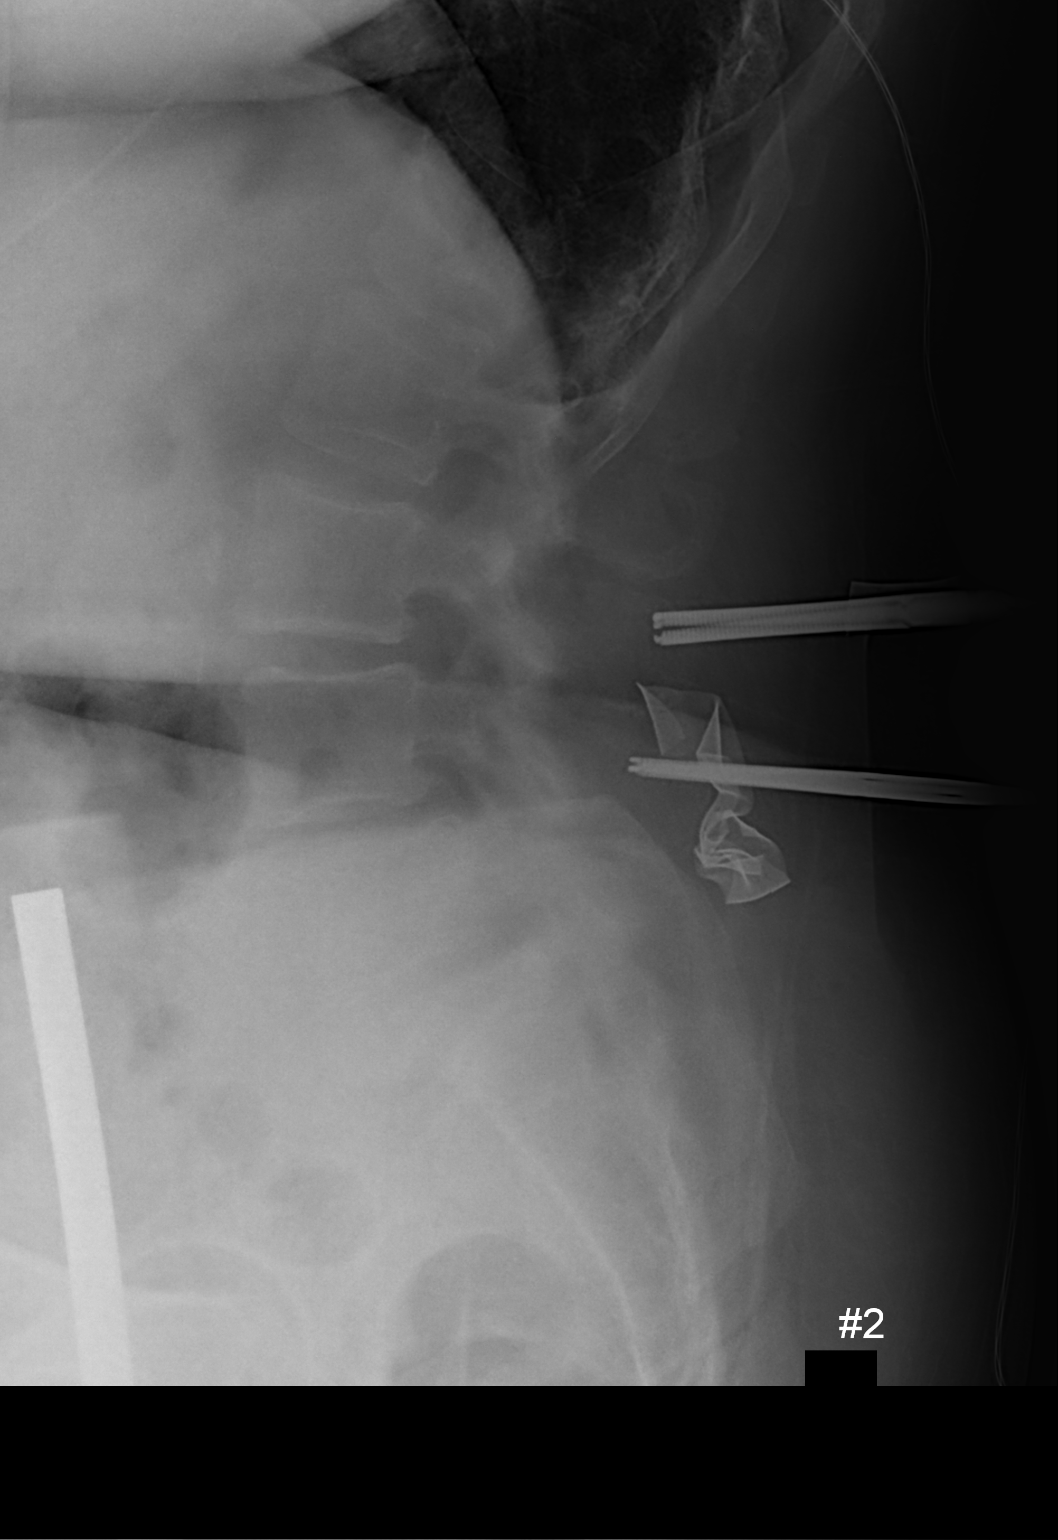

[1 of 1 positions shown; findings below may reference images not displayed]

FINDINGS: Superior clamp L3 spinous process level.

Inferior clamp L4 spinous process level.

Surgical sponges in place.
IMPRESSION: Superior clamp L3 spinous process level.

Inferior clamp L4 spinous process level.

Surgical sponges in place.

This is a call report.

## 2013-11-07 ENCOUNTER — Other Ambulatory Visit: Payer: Self-pay

## 2013-11-07 MED ORDER — HYDROCHLOROTHIAZIDE 25 MG PO TABS: 25.0000 mg | ORAL_TABLET | Freq: Every day | ORAL | Status: DC

## 2013-11-28 ENCOUNTER — Telehealth: Payer: Self-pay | Admitting: *Deleted

## 2013-11-28 MED ORDER — HYDROCHLOROTHIAZIDE 25 MG PO TABS: 25.0000 mg | ORAL_TABLET | Freq: Every day | ORAL | Status: DC

## 2013-11-28 NOTE — Telephone Encounter (Signed)
Pt states she is needing a week worth on her HCTZ until she see Dr. Doug Sou 12/06/13. Inform pt will send to [facility]...Johny Chess

## 2013-12-06 ENCOUNTER — Ambulatory Visit (INDEPENDENT_AMBULATORY_CARE_PROVIDER_SITE_OTHER): Payer: Medicare Other | Admitting: Internal Medicine

## 2013-12-06 ENCOUNTER — Encounter: Payer: Self-pay | Admitting: Internal Medicine

## 2013-12-06 VITALS — BP 152/90 | HR 64 | Temp 98.4°F | Resp 16 | Ht 63.5 in | Wt 245.8 lb

## 2013-12-06 DIAGNOSIS — Z0000 Encounter for general adult medical examination without abnormal findings: Secondary | ICD-10-CM

## 2013-12-06 DIAGNOSIS — I10 Essential (primary) hypertension: Secondary | ICD-10-CM

## 2013-12-06 DIAGNOSIS — R350 Frequency of micturition: Secondary | ICD-10-CM | POA: Diagnosis not present

## 2013-12-06 DIAGNOSIS — K7689 Other specified diseases of liver: Secondary | ICD-10-CM

## 2013-12-06 LAB — POCT URINALYSIS DIPSTICK
Bilirubin, UA: NEGATIVE
GLUCOSE UA: NEGATIVE
KETONES UA: NEGATIVE
Leukocytes, UA: NEGATIVE
Nitrite, UA: POSITIVE
Protein, UA: NEGATIVE
RBC UA: NEGATIVE
Urobilinogen, UA: 4
pH, UA: 6

## 2013-12-06 MED ORDER — HYDROCHLOROTHIAZIDE 25 MG PO TABS: 25.0000 mg | ORAL_TABLET | Freq: Every day | ORAL | Status: DC

## 2013-12-06 NOTE — Progress Notes (Signed)
Pre visit review using our clinic review tool, if applicable. No additional management support is needed unless otherwise documented below in the visit note. 

## 2013-12-06 NOTE — Patient Instructions (Addendum)
Think about doing a mammogram again this year. You also need to get another colon cancer screening in the next year or so.   We have checked your urine today and you do not have an infection.  Call your insurance to check on how much the shingles shot would cost.   Come back in about 6 months to check on the blood pressure.  Work on exercising if you can to help strength your muscles and endurance. Water aerobics may be easier on your joints and hips while still getting you the exercise you need.  Exercise to Stay Healthy Exercise helps you become and stay healthy. EXERCISE IDEAS AND TIPS Choose exercises that:  You enjoy.  Fit into your day. You do not need to exercise really hard to be healthy. You can do exercises at a slow or medium level and stay healthy. You can:  Stretch before and after working out.  Try yoga, Pilates, or tai chi.  Lift weights.  Walk fast, swim, jog, run, climb stairs, bicycle, dance, or rollerskate.  Take aerobic classes. Exercises that burn about 150 calories:  Running 1  miles in 15 minutes.  Playing volleyball for 45 to 60 minutes.  Washing and waxing a car for 45 to 60 minutes.  Playing touch football for 45 minutes.  Walking 1  miles in 35 minutes.  Pushing a stroller 1  miles in 30 minutes.  Playing basketball for 30 minutes.  Raking leaves for 30 minutes.  Bicycling 5 miles in 30 minutes.  Walking 2 miles in 30 minutes.  Dancing for 30 minutes.  Shoveling snow for 15 minutes.  Swimming laps for 20 minutes.  Walking up stairs for 15 minutes.  Bicycling 4 miles in 15 minutes.  Gardening for 30 to 45 minutes.  Jumping rope for 15 minutes.  Washing windows or floors for 45 to 60 minutes. Document Released: 03/27/2010 Document Revised: 05/17/2011 Document Reviewed: 03/27/2010 [facility] Patient Information 2015 [place], [place]. This information is not intended to replace advice given to you by your health care provider.  Make sure you discuss any questions you have with your health care provider.

## 2013-12-07 NOTE — Assessment & Plan Note (Signed)
She is trying to catch up on things for herself. She will get mammogram. Talked with her about colonoscopy and she will consider to get it.

## 2013-12-07 NOTE — Assessment & Plan Note (Signed)
BP mildly elevated but she did not take the HCTZ yet. Continue with losartan, HCTZ, metoprolol and monitor at next visit.
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
## 2013-12-07 NOTE — Assessment & Plan Note (Signed)
Will need repeat CT and will look into date of last and order.

## 2013-12-07 NOTE — Assessment & Plan Note (Signed)
Much improved and she will try to go back to the gym now.

## 2014-03-05 ENCOUNTER — Telehealth: Payer: Self-pay | Admitting: Internal Medicine

## 2014-03-05 NOTE — Telephone Encounter (Signed)
Pt called in and she is requesting all 3 of her meds filled that she is currently taking on her med list?  **Hydrochlorothiazide 25mg  **Losartan 100mg  **Metoprolol 50mg    She would like them called into [facility] off of [address]

## 2014-03-06 MED ORDER — METOPROLOL TARTRATE 50 MG PO TABS: 50.0000 mg | ORAL_TABLET | Freq: Two times a day (BID) | ORAL | Status: DC

## 2014-03-06 MED ORDER — HYDROCHLOROTHIAZIDE 25 MG PO TABS: 25.0000 mg | ORAL_TABLET | Freq: Every day | ORAL | Status: DC

## 2014-03-06 MED ORDER — LOSARTAN POTASSIUM 100 MG PO TABS: 100.0000 mg | ORAL_TABLET | Freq: Every day | ORAL | Status: DC

## 2014-03-06 NOTE — Telephone Encounter (Signed)
Notified pt refill sent to [facility]...Carolyn Fowler

## 2014-05-16 DIAGNOSIS — G8929 Other chronic pain: Secondary | ICD-10-CM | POA: Diagnosis not present

## 2014-05-16 DIAGNOSIS — M5416 Radiculopathy, lumbar region: Secondary | ICD-10-CM | POA: Diagnosis not present

## 2014-05-16 DIAGNOSIS — I10 Essential (primary) hypertension: Secondary | ICD-10-CM | POA: Diagnosis not present

## 2014-05-27 DIAGNOSIS — H2513 Age-related nuclear cataract, bilateral: Secondary | ICD-10-CM | POA: Diagnosis not present

## 2014-05-29 DIAGNOSIS — M545 Low back pain: Secondary | ICD-10-CM | POA: Diagnosis not present

## 2014-05-29 DIAGNOSIS — M5430 Sciatica, unspecified side: Secondary | ICD-10-CM | POA: Diagnosis not present

## 2014-06-07 DIAGNOSIS — M545 Low back pain: Secondary | ICD-10-CM | POA: Diagnosis not present

## 2014-06-07 DIAGNOSIS — M5430 Sciatica, unspecified side: Secondary | ICD-10-CM | POA: Diagnosis not present

## 2014-06-14 DIAGNOSIS — M5430 Sciatica, unspecified side: Secondary | ICD-10-CM | POA: Diagnosis not present

## 2014-06-14 DIAGNOSIS — M545 Low back pain: Secondary | ICD-10-CM | POA: Diagnosis not present

## 2014-06-20 DIAGNOSIS — S3992XA Unspecified injury of lower back, initial encounter: Secondary | ICD-10-CM | POA: Diagnosis not present

## 2014-06-20 DIAGNOSIS — M545 Low back pain: Secondary | ICD-10-CM | POA: Diagnosis not present

## 2014-06-21 DIAGNOSIS — M545 Low back pain: Secondary | ICD-10-CM | POA: Diagnosis not present

## 2014-06-21 DIAGNOSIS — M5430 Sciatica, unspecified side: Secondary | ICD-10-CM | POA: Diagnosis not present

## 2014-06-26 ENCOUNTER — Other Ambulatory Visit: Payer: Self-pay | Admitting: Internal Medicine

## 2014-06-26 DIAGNOSIS — Z0000 Encounter for general adult medical examination without abnormal findings: Secondary | ICD-10-CM | POA: Diagnosis not present

## 2014-06-26 DIAGNOSIS — Z1231 Encounter for screening mammogram for malignant neoplasm of breast: Secondary | ICD-10-CM

## 2014-06-26 DIAGNOSIS — Z1389 Encounter for screening for other disorder: Secondary | ICD-10-CM | POA: Diagnosis not present

## 2014-06-26 DIAGNOSIS — E559 Vitamin D deficiency, unspecified: Secondary | ICD-10-CM | POA: Diagnosis not present

## 2014-06-26 DIAGNOSIS — H6121 Impacted cerumen, right ear: Secondary | ICD-10-CM | POA: Diagnosis not present

## 2014-06-26 DIAGNOSIS — R0602 Shortness of breath: Secondary | ICD-10-CM | POA: Diagnosis not present

## 2014-06-26 DIAGNOSIS — M25552 Pain in left hip: Secondary | ICD-10-CM | POA: Diagnosis not present

## 2014-06-26 DIAGNOSIS — I10 Essential (primary) hypertension: Secondary | ICD-10-CM | POA: Diagnosis not present

## 2014-06-26 DIAGNOSIS — M5416 Radiculopathy, lumbar region: Secondary | ICD-10-CM | POA: Diagnosis not present

## 2014-06-28 DIAGNOSIS — M545 Low back pain: Secondary | ICD-10-CM | POA: Diagnosis not present

## 2014-06-28 DIAGNOSIS — M5430 Sciatica, unspecified side: Secondary | ICD-10-CM | POA: Diagnosis not present

## 2014-07-01 DIAGNOSIS — R768 Other specified abnormal immunological findings in serum: Secondary | ICD-10-CM | POA: Diagnosis not present

## 2014-07-01 DIAGNOSIS — E559 Vitamin D deficiency, unspecified: Secondary | ICD-10-CM | POA: Diagnosis not present

## 2014-07-01 DIAGNOSIS — R5383 Other fatigue: Secondary | ICD-10-CM | POA: Diagnosis not present

## 2014-07-10 ENCOUNTER — Ambulatory Visit: Payer: Medicare Other

## 2014-07-16 ENCOUNTER — Ambulatory Visit
Admission: RE | Admit: 2014-07-16 | Discharge: 2014-07-16 | Disposition: A | Discharge status: Home / Self Care | Payer: Medicare Other | Source: Ambulatory Visit | Attending: Internal Medicine | Admitting: Internal Medicine

## 2014-07-16 ENCOUNTER — Other Ambulatory Visit: Payer: Self-pay | Admitting: Internal Medicine

## 2014-07-16 ENCOUNTER — Ambulatory Visit: Payer: Medicare Other

## 2014-07-16 DIAGNOSIS — Z1231 Encounter for screening mammogram for malignant neoplasm of breast: Secondary | ICD-10-CM

## 2014-07-16 DIAGNOSIS — M4806 Spinal stenosis, lumbar region: Secondary | ICD-10-CM | POA: Diagnosis not present

## 2014-07-16 DIAGNOSIS — M4316 Spondylolisthesis, lumbar region: Secondary | ICD-10-CM | POA: Diagnosis not present

## 2014-07-16 DIAGNOSIS — M4716 Other spondylosis with myelopathy, lumbar region: Secondary | ICD-10-CM | POA: Diagnosis not present

## 2014-07-16 DIAGNOSIS — M419 Scoliosis, unspecified: Secondary | ICD-10-CM | POA: Diagnosis not present

## 2014-07-23 DIAGNOSIS — M419 Scoliosis, unspecified: Secondary | ICD-10-CM | POA: Diagnosis not present

## 2014-07-23 DIAGNOSIS — M4316 Spondylolisthesis, lumbar region: Secondary | ICD-10-CM | POA: Diagnosis not present

## 2014-07-23 DIAGNOSIS — M4806 Spinal stenosis, lumbar region: Secondary | ICD-10-CM | POA: Diagnosis not present

## 2014-07-25 ENCOUNTER — Other Ambulatory Visit: Payer: Self-pay | Admitting: Orthopedic Surgery

## 2014-07-25 DIAGNOSIS — M48061 Spinal stenosis, lumbar region without neurogenic claudication: Secondary | ICD-10-CM

## 2014-07-25 DIAGNOSIS — R5381 Other malaise: Secondary | ICD-10-CM

## 2014-07-26 ENCOUNTER — Other Ambulatory Visit: Payer: Self-pay | Admitting: Orthopedic Surgery

## 2014-07-26 DIAGNOSIS — M8580 Other specified disorders of bone density and structure, unspecified site: Secondary | ICD-10-CM

## 2014-07-29 ENCOUNTER — Ambulatory Visit
Admission: RE | Admit: 2014-07-29 | Discharge: 2014-07-29 | Disposition: A | Discharge status: Home / Self Care | Payer: Medicare Other | Source: Ambulatory Visit | Attending: Orthopedic Surgery | Admitting: Orthopedic Surgery

## 2014-07-29 DIAGNOSIS — M5137 Other intervertebral disc degeneration, lumbosacral region: Secondary | ICD-10-CM | POA: Diagnosis not present

## 2014-07-29 DIAGNOSIS — M8580 Other specified disorders of bone density and structure, unspecified site: Secondary | ICD-10-CM

## 2014-07-29 DIAGNOSIS — M47817 Spondylosis without myelopathy or radiculopathy, lumbosacral region: Secondary | ICD-10-CM | POA: Diagnosis not present

## 2014-07-29 DIAGNOSIS — M8588 Other specified disorders of bone density and structure, other site: Secondary | ICD-10-CM | POA: Diagnosis not present

## 2014-07-29 DIAGNOSIS — M4806 Spinal stenosis, lumbar region: Secondary | ICD-10-CM | POA: Diagnosis not present

## 2014-07-29 DIAGNOSIS — M48061 Spinal stenosis, lumbar region without neurogenic claudication: Secondary | ICD-10-CM

## 2014-07-29 DIAGNOSIS — M5127 Other intervertebral disc displacement, lumbosacral region: Secondary | ICD-10-CM | POA: Diagnosis not present

## 2014-07-29 DIAGNOSIS — Z780 Asymptomatic menopausal state: Secondary | ICD-10-CM | POA: Diagnosis not present

## 2014-08-13 DIAGNOSIS — M4806 Spinal stenosis, lumbar region: Secondary | ICD-10-CM | POA: Diagnosis not present

## 2014-08-13 DIAGNOSIS — M4316 Spondylolisthesis, lumbar region: Secondary | ICD-10-CM | POA: Diagnosis not present

## 2014-08-13 DIAGNOSIS — M419 Scoliosis, unspecified: Secondary | ICD-10-CM | POA: Diagnosis not present

## 2014-10-04 DIAGNOSIS — M4716 Other spondylosis with myelopathy, lumbar region: Secondary | ICD-10-CM | POA: Diagnosis not present

## 2014-10-04 DIAGNOSIS — Z01818 Encounter for other preprocedural examination: Secondary | ICD-10-CM | POA: Diagnosis not present

## 2014-10-04 DIAGNOSIS — M5431 Sciatica, right side: Secondary | ICD-10-CM | POA: Diagnosis not present

## 2014-10-04 DIAGNOSIS — M5432 Sciatica, left side: Secondary | ICD-10-CM | POA: Diagnosis not present

## 2014-10-04 DIAGNOSIS — M419 Scoliosis, unspecified: Secondary | ICD-10-CM | POA: Diagnosis not present

## 2014-10-04 DIAGNOSIS — M4806 Spinal stenosis, lumbar region: Secondary | ICD-10-CM | POA: Diagnosis not present

## 2014-10-04 DIAGNOSIS — G8929 Other chronic pain: Secondary | ICD-10-CM | POA: Insufficient documentation

## 2014-10-07 DIAGNOSIS — M4120 Other idiopathic scoliosis, site unspecified: Secondary | ICD-10-CM | POA: Diagnosis not present

## 2014-10-07 DIAGNOSIS — S32009K Unspecified fracture of unspecified lumbar vertebra, subsequent encounter for fracture with nonunion: Secondary | ICD-10-CM | POA: Diagnosis not present

## 2014-10-07 DIAGNOSIS — Z981 Arthrodesis status: Secondary | ICD-10-CM | POA: Diagnosis not present

## 2014-10-07 DIAGNOSIS — M5431 Sciatica, right side: Secondary | ICD-10-CM | POA: Diagnosis not present

## 2014-10-07 DIAGNOSIS — M4806 Spinal stenosis, lumbar region: Secondary | ICD-10-CM | POA: Diagnosis present

## 2014-10-07 DIAGNOSIS — J189 Pneumonia, unspecified organism: Secondary | ICD-10-CM | POA: Diagnosis not present

## 2014-10-07 DIAGNOSIS — M5432 Sciatica, left side: Secondary | ICD-10-CM | POA: Diagnosis not present

## 2014-10-07 DIAGNOSIS — Z4782 Encounter for orthopedic aftercare following scoliosis surgery: Secondary | ICD-10-CM | POA: Diagnosis not present

## 2014-10-07 DIAGNOSIS — Z4889 Encounter for other specified surgical aftercare: Secondary | ICD-10-CM | POA: Diagnosis not present

## 2014-10-07 DIAGNOSIS — R000 Tachycardia, unspecified: Secondary | ICD-10-CM | POA: Diagnosis not present

## 2014-10-07 DIAGNOSIS — M4316 Spondylolisthesis, lumbar region: Secondary | ICD-10-CM | POA: Diagnosis not present

## 2014-10-07 DIAGNOSIS — M4726 Other spondylosis with radiculopathy, lumbar region: Secondary | ICD-10-CM | POA: Diagnosis not present

## 2014-10-07 DIAGNOSIS — I10 Essential (primary) hypertension: Secondary | ICD-10-CM | POA: Diagnosis present

## 2014-10-07 DIAGNOSIS — M960 Pseudarthrosis after fusion or arthrodesis: Secondary | ICD-10-CM | POA: Diagnosis present

## 2014-10-07 DIAGNOSIS — M4807 Spinal stenosis, lumbosacral region: Secondary | ICD-10-CM | POA: Diagnosis present

## 2014-10-07 DIAGNOSIS — M419 Scoliosis, unspecified: Secondary | ICD-10-CM | POA: Diagnosis not present

## 2014-10-07 DIAGNOSIS — Z9889 Other specified postprocedural states: Secondary | ICD-10-CM | POA: Diagnosis not present

## 2014-10-07 DIAGNOSIS — R54 Age-related physical debility: Secondary | ICD-10-CM | POA: Diagnosis not present

## 2014-10-07 DIAGNOSIS — Z880 Allergy status to penicillin: Secondary | ICD-10-CM | POA: Diagnosis not present

## 2014-10-07 DIAGNOSIS — M4126 Other idiopathic scoliosis, lumbar region: Secondary | ICD-10-CM | POA: Diagnosis present

## 2014-10-07 HISTORY — PX: BACK SURGERY: SHX140

## 2014-10-11 DIAGNOSIS — Z981 Arthrodesis status: Secondary | ICD-10-CM | POA: Diagnosis not present

## 2014-10-11 DIAGNOSIS — Z880 Allergy status to penicillin: Secondary | ICD-10-CM | POA: Diagnosis not present

## 2014-10-11 DIAGNOSIS — M419 Scoliosis, unspecified: Secondary | ICD-10-CM | POA: Diagnosis present

## 2014-10-11 DIAGNOSIS — Z6841 Body Mass Index (BMI) 40.0 and over, adult: Secondary | ICD-10-CM | POA: Diagnosis not present

## 2014-10-11 DIAGNOSIS — M4316 Spondylolisthesis, lumbar region: Secondary | ICD-10-CM | POA: Diagnosis present

## 2014-10-11 DIAGNOSIS — M545 Low back pain: Secondary | ICD-10-CM | POA: Diagnosis not present

## 2014-10-11 DIAGNOSIS — R5381 Other malaise: Secondary | ICD-10-CM | POA: Diagnosis present

## 2014-10-11 DIAGNOSIS — K5900 Constipation, unspecified: Secondary | ICD-10-CM | POA: Diagnosis present

## 2014-10-11 DIAGNOSIS — I10 Essential (primary) hypertension: Secondary | ICD-10-CM | POA: Diagnosis present

## 2014-10-11 DIAGNOSIS — M4806 Spinal stenosis, lumbar region: Secondary | ICD-10-CM | POA: Diagnosis present

## 2014-10-11 DIAGNOSIS — J189 Pneumonia, unspecified organism: Secondary | ICD-10-CM | POA: Diagnosis not present

## 2014-10-11 DIAGNOSIS — R509 Fever, unspecified: Secondary | ICD-10-CM | POA: Diagnosis not present

## 2014-10-11 DIAGNOSIS — R54 Age-related physical debility: Secondary | ICD-10-CM | POA: Diagnosis not present

## 2014-10-11 DIAGNOSIS — H109 Unspecified conjunctivitis: Secondary | ICD-10-CM | POA: Diagnosis present

## 2014-10-11 DIAGNOSIS — Z4782 Encounter for orthopedic aftercare following scoliosis surgery: Secondary | ICD-10-CM | POA: Diagnosis not present

## 2014-10-11 DIAGNOSIS — I159 Secondary hypertension, unspecified: Secondary | ICD-10-CM | POA: Diagnosis not present

## 2014-10-15 DIAGNOSIS — R509 Fever, unspecified: Secondary | ICD-10-CM | POA: Diagnosis not present

## 2014-10-16 DIAGNOSIS — J189 Pneumonia, unspecified organism: Secondary | ICD-10-CM | POA: Insufficient documentation

## 2014-10-26 DIAGNOSIS — J189 Pneumonia, unspecified organism: Secondary | ICD-10-CM | POA: Diagnosis not present

## 2014-10-26 DIAGNOSIS — I10 Essential (primary) hypertension: Secondary | ICD-10-CM | POA: Diagnosis not present

## 2014-10-26 DIAGNOSIS — Z4789 Encounter for other orthopedic aftercare: Secondary | ICD-10-CM | POA: Diagnosis not present

## 2014-10-26 DIAGNOSIS — M150 Primary generalized (osteo)arthritis: Secondary | ICD-10-CM | POA: Diagnosis not present

## 2014-10-27 DIAGNOSIS — I10 Essential (primary) hypertension: Secondary | ICD-10-CM | POA: Diagnosis not present

## 2014-10-27 DIAGNOSIS — J189 Pneumonia, unspecified organism: Secondary | ICD-10-CM | POA: Diagnosis not present

## 2014-10-27 DIAGNOSIS — M150 Primary generalized (osteo)arthritis: Secondary | ICD-10-CM | POA: Diagnosis not present

## 2014-10-27 DIAGNOSIS — Z4789 Encounter for other orthopedic aftercare: Secondary | ICD-10-CM | POA: Diagnosis not present

## 2014-10-28 DIAGNOSIS — M150 Primary generalized (osteo)arthritis: Secondary | ICD-10-CM | POA: Diagnosis not present

## 2014-10-28 DIAGNOSIS — I10 Essential (primary) hypertension: Secondary | ICD-10-CM | POA: Diagnosis not present

## 2014-10-28 DIAGNOSIS — J189 Pneumonia, unspecified organism: Secondary | ICD-10-CM | POA: Diagnosis not present

## 2014-10-28 DIAGNOSIS — Z4789 Encounter for other orthopedic aftercare: Secondary | ICD-10-CM | POA: Diagnosis not present

## 2014-10-29 ENCOUNTER — Encounter: Payer: Self-pay | Admitting: Internal Medicine

## 2014-10-29 ENCOUNTER — Ambulatory Visit (INDEPENDENT_AMBULATORY_CARE_PROVIDER_SITE_OTHER): Payer: Medicare Other | Admitting: Internal Medicine

## 2014-10-29 VITALS — BP 152/104 | HR 105 | Temp 98.7°F | Resp 18 | Wt 249.0 lb

## 2014-10-29 DIAGNOSIS — M4806 Spinal stenosis, lumbar region: Secondary | ICD-10-CM | POA: Diagnosis not present

## 2014-10-29 DIAGNOSIS — I10 Essential (primary) hypertension: Secondary | ICD-10-CM | POA: Diagnosis not present

## 2014-10-29 DIAGNOSIS — Z4789 Encounter for other orthopedic aftercare: Secondary | ICD-10-CM | POA: Diagnosis not present

## 2014-10-29 DIAGNOSIS — M48061 Spinal stenosis, lumbar region without neurogenic claudication: Secondary | ICD-10-CM

## 2014-10-29 DIAGNOSIS — J310 Chronic rhinitis: Secondary | ICD-10-CM

## 2014-10-29 DIAGNOSIS — J189 Pneumonia, unspecified organism: Secondary | ICD-10-CM | POA: Diagnosis not present

## 2014-10-29 DIAGNOSIS — M150 Primary generalized (osteo)arthritis: Secondary | ICD-10-CM | POA: Diagnosis not present

## 2014-10-29 DIAGNOSIS — R195 Other fecal abnormalities: Secondary | ICD-10-CM | POA: Diagnosis not present
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
## 2014-10-29 NOTE — Progress Notes (Signed)
Pre visit review using our clinic review tool, if applicable. No additional management support is needed unless otherwise documented below in the visit note. 

## 2014-10-29 NOTE — Patient Instructions (Signed)
Plain Mucinex (NOT D) for thick secretions ;force NON dairy fluids .   Nasal cleansing in the shower as discussed with lather of mild shampoo.After 10 seconds wash off lather while  exhaling through nostrils. Make sure that all residual soap is removed to prevent irritation.  Flonase OR Nasacort AQ 1 spray in each nostril twice a day as needed. Use the "crossover" technique into opposite nostril spraying toward opposite ear @ 45 degree angle, not straight up into nostril.  Plain Allegra (NOT D )  160 daily , Loratidine 10 mg , OR Zyrtec 10 mg @ bedtime  as needed for itchy eyes & sneezing.  Please take a probiotic , Florastor OR Align, every day if the bowels are loose. This will replace the normal bacteria which  are necessary for formation of normal stool and processing of food.  Please restart the HCTZ 25 mg one half in the morning and metoprolol 50 mg one half twice a day.  Minimal Blood Pressure Goal= AVERAGE < 140/90;  Ideal is an AVERAGE < 135/85. This AVERAGE should be calculated from @ least 5-7 BP readings taken @ different times of day on different days of week. You should not respond to isolated BP readings , but rather the AVERAGE for that week .Please bring your  blood pressure cuff to office visits to verify that it is reliable.It  can also be checked against the blood pressure device at the pharmacy. Finger or wrist cuffs are not dependable; an arm cuff is.

## 2014-10-30 DIAGNOSIS — J189 Pneumonia, unspecified organism: Secondary | ICD-10-CM | POA: Diagnosis not present

## 2014-10-30 DIAGNOSIS — M150 Primary generalized (osteo)arthritis: Secondary | ICD-10-CM | POA: Diagnosis not present

## 2014-10-30 DIAGNOSIS — I10 Essential (primary) hypertension: Secondary | ICD-10-CM | POA: Diagnosis not present

## 2014-10-30 DIAGNOSIS — Z4789 Encounter for other orthopedic aftercare: Secondary | ICD-10-CM | POA: Diagnosis not present

## 2014-10-31 ENCOUNTER — Telehealth: Payer: Self-pay | Admitting: Internal Medicine

## 2014-10-31 DIAGNOSIS — J189 Pneumonia, unspecified organism: Secondary | ICD-10-CM | POA: Diagnosis not present

## 2014-10-31 DIAGNOSIS — Z4789 Encounter for other orthopedic aftercare: Secondary | ICD-10-CM | POA: Diagnosis not present

## 2014-10-31 DIAGNOSIS — I10 Essential (primary) hypertension: Secondary | ICD-10-CM | POA: Diagnosis not present

## 2014-10-31 DIAGNOSIS — M150 Primary generalized (osteo)arthritis: Secondary | ICD-10-CM | POA: Diagnosis not present

## 2014-10-31 NOTE — Telephone Encounter (Signed)
[name] daughter that patient B/P is elevated 182/122 [facility] Please advise

## 2014-10-31 NOTE — Telephone Encounter (Signed)
Is she taking her medications as directed 2 days ago? Is she having any symptoms?

## 2014-11-01 ENCOUNTER — Inpatient Hospital Stay: Payer: Medicare Other | Admitting: Internal Medicine

## 2014-11-01 DIAGNOSIS — Z4789 Encounter for other orthopedic aftercare: Secondary | ICD-10-CM | POA: Diagnosis not present

## 2014-11-01 DIAGNOSIS — J189 Pneumonia, unspecified organism: Secondary | ICD-10-CM | POA: Diagnosis not present

## 2014-11-01 DIAGNOSIS — M150 Primary generalized (osteo)arthritis: Secondary | ICD-10-CM | POA: Diagnosis not present

## 2014-11-01 DIAGNOSIS — I10 Essential (primary) hypertension: Secondary | ICD-10-CM | POA: Diagnosis not present

## 2014-11-01 NOTE — Telephone Encounter (Signed)
Left message on voice mail for Autumn from [facility] to call me back.

## 2014-11-01 NOTE — Telephone Encounter (Signed)
Carolyn Fowler from [facility] called regarding her bp also. She feels she maybe self medicating herself with more medication than needing  bp today was 132/100 in right arm and 142/100 in left arm Please call Carolyn Fowler back at [phone] This is her personal cell and you may leave message if she is unable to answer

## 2014-11-04 ENCOUNTER — Telehealth: Payer: Self-pay | Admitting: Internal Medicine

## 2014-11-04 DIAGNOSIS — M150 Primary generalized (osteo)arthritis: Secondary | ICD-10-CM | POA: Diagnosis not present

## 2014-11-04 DIAGNOSIS — J189 Pneumonia, unspecified organism: Secondary | ICD-10-CM | POA: Diagnosis not present

## 2014-11-04 DIAGNOSIS — Z4789 Encounter for other orthopedic aftercare: Secondary | ICD-10-CM | POA: Diagnosis not present

## 2014-11-04 DIAGNOSIS — I10 Essential (primary) hypertension: Secondary | ICD-10-CM | POA: Diagnosis not present

## 2014-11-04 NOTE — Telephone Encounter (Signed)
Spoke with patient's daughter and informed her of Dr. Donneta Romberg instructions. She will wait 2 weeks and if blood pressure is still high, they will schedule an office visit.

## 2014-11-04 NOTE — Telephone Encounter (Signed)
Would recommend that she wait at least 2 weeks once on the medicines whole pill before any adjustments.

## 2014-11-04 NOTE — Telephone Encounter (Signed)
Patient's daughter called and said her mother's blood pressure has been running from 150 to 180 consistently. Home health has been out and has been checking it. Would you be willing to adjust blood pressure medications without an office visit. Dr. Linna Darner recently saw the patient and told her to take half of the metoprolol and hctz, and a whole Losartan. There was no change with the half dosages and the patient has been taking a whole pill of all three medications and still no change in the blood pressure.

## 2014-11-04 NOTE — Telephone Encounter (Signed)
Patient  Daughter would like to talk to you, she wouldn't tell me why

## 2014-11-05 ENCOUNTER — Ambulatory Visit: Payer: Medicare Other | Admitting: Internal Medicine

## 2014-11-05 DIAGNOSIS — I10 Essential (primary) hypertension: Secondary | ICD-10-CM | POA: Diagnosis not present

## 2014-11-05 DIAGNOSIS — M150 Primary generalized (osteo)arthritis: Secondary | ICD-10-CM | POA: Diagnosis not present

## 2014-11-05 DIAGNOSIS — Z4789 Encounter for other orthopedic aftercare: Secondary | ICD-10-CM | POA: Diagnosis not present

## 2014-11-05 DIAGNOSIS — J189 Pneumonia, unspecified organism: Secondary | ICD-10-CM | POA: Diagnosis not present

## 2014-11-06 DIAGNOSIS — M150 Primary generalized (osteo)arthritis: Secondary | ICD-10-CM | POA: Diagnosis not present

## 2014-11-06 DIAGNOSIS — J189 Pneumonia, unspecified organism: Secondary | ICD-10-CM | POA: Diagnosis not present

## 2014-11-06 DIAGNOSIS — Z4789 Encounter for other orthopedic aftercare: Secondary | ICD-10-CM | POA: Diagnosis not present

## 2014-11-06 DIAGNOSIS — I10 Essential (primary) hypertension: Secondary | ICD-10-CM | POA: Diagnosis not present

## 2014-11-07 DIAGNOSIS — J189 Pneumonia, unspecified organism: Secondary | ICD-10-CM | POA: Diagnosis not present

## 2014-11-07 DIAGNOSIS — I10 Essential (primary) hypertension: Secondary | ICD-10-CM | POA: Diagnosis not present

## 2014-11-07 DIAGNOSIS — M150 Primary generalized (osteo)arthritis: Secondary | ICD-10-CM | POA: Diagnosis not present

## 2014-11-07 DIAGNOSIS — Z4789 Encounter for other orthopedic aftercare: Secondary | ICD-10-CM | POA: Diagnosis not present

## 2014-11-08 DIAGNOSIS — J189 Pneumonia, unspecified organism: Secondary | ICD-10-CM | POA: Diagnosis not present

## 2014-11-08 DIAGNOSIS — Z4789 Encounter for other orthopedic aftercare: Secondary | ICD-10-CM | POA: Diagnosis not present

## 2014-11-08 DIAGNOSIS — M150 Primary generalized (osteo)arthritis: Secondary | ICD-10-CM | POA: Diagnosis not present

## 2014-11-08 DIAGNOSIS — M4316 Spondylolisthesis, lumbar region: Secondary | ICD-10-CM | POA: Diagnosis not present

## 2014-11-08 DIAGNOSIS — I10 Essential (primary) hypertension: Secondary | ICD-10-CM | POA: Diagnosis not present

## 2014-11-11 DIAGNOSIS — M150 Primary generalized (osteo)arthritis: Secondary | ICD-10-CM | POA: Diagnosis not present

## 2014-11-11 DIAGNOSIS — I10 Essential (primary) hypertension: Secondary | ICD-10-CM | POA: Diagnosis not present

## 2014-11-11 DIAGNOSIS — Z4789 Encounter for other orthopedic aftercare: Secondary | ICD-10-CM | POA: Diagnosis not present

## 2014-11-11 DIAGNOSIS — J189 Pneumonia, unspecified organism: Secondary | ICD-10-CM | POA: Diagnosis not present

## 2014-11-12 ENCOUNTER — Telehealth: Payer: Self-pay | Admitting: Internal Medicine

## 2014-11-12 DIAGNOSIS — I10 Essential (primary) hypertension: Secondary | ICD-10-CM | POA: Diagnosis not present

## 2014-11-12 DIAGNOSIS — J189 Pneumonia, unspecified organism: Secondary | ICD-10-CM | POA: Diagnosis not present

## 2014-11-12 DIAGNOSIS — Z4789 Encounter for other orthopedic aftercare: Secondary | ICD-10-CM | POA: Diagnosis not present

## 2014-11-12 DIAGNOSIS — M150 Primary generalized (osteo)arthritis: Secondary | ICD-10-CM | POA: Diagnosis not present

## 2014-11-12 NOTE — Telephone Encounter (Signed)
Spoke to [place] and gave blood pressure parameters and also informed her that the patient needs an office visit within 2 weeks because she has not been seen in almost a year.

## 2014-11-12 NOTE — Telephone Encounter (Signed)
Would like to extend home health aid and OT for 2 x week for 4 weeks effective 9/12.  Patient is still having difficulty with range of motion for both arms.  Will take verbal order.

## 2014-11-12 NOTE — Telephone Encounter (Signed)
Called to give verbal order. Patient's blood pressure is still elevated and has fluctuated. Home health has not been able to do OT, or PT some days due to elevated blood pressure. Shirlean Mylar said you could set a guideline for them to follow if you didn't want them to follow the standard. Please advise, thanks.

## 2014-11-12 NOTE — Telephone Encounter (Signed)
Would recommend visit within next 2 weeks as I have not seen her in almost 1 year. Verbal order okay for her to work with PT as long as systolic pressure not >546 and diastolic not >27.

## 2014-11-13 ENCOUNTER — Telehealth: Payer: Self-pay | Admitting: *Deleted

## 2014-11-13 DIAGNOSIS — I10 Essential (primary) hypertension: Secondary | ICD-10-CM | POA: Diagnosis not present

## 2014-11-13 DIAGNOSIS — M150 Primary generalized (osteo)arthritis: Secondary | ICD-10-CM | POA: Diagnosis not present

## 2014-11-13 DIAGNOSIS — J189 Pneumonia, unspecified organism: Secondary | ICD-10-CM | POA: Diagnosis not present

## 2014-11-13 DIAGNOSIS — Z4789 Encounter for other orthopedic aftercare: Secondary | ICD-10-CM | POA: Diagnosis not present

## 2014-11-13 NOTE — Telephone Encounter (Signed)
Left msg on triage stating wanting to report pt BP 160/104. Wanting to get orders to see pt more frequently to help monitor BP. Pt will be coming in on Friday to see md.../lmb

## 2014-11-14 DIAGNOSIS — J189 Pneumonia, unspecified organism: Secondary | ICD-10-CM | POA: Diagnosis not present

## 2014-11-14 DIAGNOSIS — Z4789 Encounter for other orthopedic aftercare: Secondary | ICD-10-CM | POA: Diagnosis not present

## 2014-11-14 DIAGNOSIS — I10 Essential (primary) hypertension: Secondary | ICD-10-CM | POA: Diagnosis not present

## 2014-11-14 DIAGNOSIS — M150 Primary generalized (osteo)arthritis: Secondary | ICD-10-CM | POA: Diagnosis not present

## 2014-11-15 ENCOUNTER — Ambulatory Visit (INDEPENDENT_AMBULATORY_CARE_PROVIDER_SITE_OTHER): Payer: Medicare Other | Admitting: Internal Medicine

## 2014-11-15 ENCOUNTER — Ambulatory Visit (INDEPENDENT_AMBULATORY_CARE_PROVIDER_SITE_OTHER)
Admission: RE | Admit: 2014-11-15 | Discharge: 2014-11-15 | Disposition: A | Discharge status: Home / Self Care | Payer: Medicare Other | Source: Ambulatory Visit | Attending: Internal Medicine | Admitting: Internal Medicine

## 2014-11-15 ENCOUNTER — Telehealth: Payer: Self-pay | Admitting: Internal Medicine

## 2014-11-15 ENCOUNTER — Other Ambulatory Visit (INDEPENDENT_AMBULATORY_CARE_PROVIDER_SITE_OTHER): Payer: Medicare Other

## 2014-11-15 ENCOUNTER — Encounter: Payer: Self-pay | Admitting: Internal Medicine

## 2014-11-15 VITALS — BP 154/98 | HR 87 | Temp 98.8°F | Resp 12 | Ht 63.0 in | Wt 244.0 lb

## 2014-11-15 DIAGNOSIS — D638 Anemia in other chronic diseases classified elsewhere: Secondary | ICD-10-CM

## 2014-11-15 DIAGNOSIS — R7301 Impaired fasting glucose: Secondary | ICD-10-CM

## 2014-11-15 DIAGNOSIS — I10 Essential (primary) hypertension: Secondary | ICD-10-CM

## 2014-11-15 DIAGNOSIS — J189 Pneumonia, unspecified organism: Secondary | ICD-10-CM

## 2014-11-15 DIAGNOSIS — L989 Disorder of the skin and subcutaneous tissue, unspecified: Secondary | ICD-10-CM

## 2014-11-15 DIAGNOSIS — R05 Cough: Secondary | ICD-10-CM | POA: Diagnosis not present

## 2014-11-15 LAB — CBC
HCT: 40.8 % (ref 36.0–46.0)
HEMOGLOBIN: 12.6 g/dL (ref 12.0–15.0)
MCHC: 31 g/dL (ref 30.0–36.0)
MCV: 72.9 fl — AB (ref 78.0–100.0)
PLATELETS: 311 10*3/uL (ref 150.0–400.0)
RBC: 5.6 Mil/uL — ABNORMAL HIGH (ref 3.87–5.11)
RDW: 15.1 % (ref 11.5–15.5)
WBC: 8.5 10*3/uL (ref 4.0–10.5)

## 2014-11-15 LAB — COMPREHENSIVE METABOLIC PANEL
ALK PHOS: 82 U/L (ref 39–117)
ALT: 11 U/L (ref 0–35)
AST: 12 U/L (ref 0–37)
Albumin: 4 g/dL (ref 3.5–5.2)
BUN: 14 mg/dL (ref 6–23)
CHLORIDE: 101 meq/L (ref 96–112)
CO2: 30 mEq/L (ref 19–32)
Calcium: 9.7 mg/dL (ref 8.4–10.5)
Creatinine, Ser: 0.55 mg/dL (ref 0.40–1.20)
GFR: 141.28 mL/min (ref >60.00)
Glucose, Bld: 98 mg/dL (ref 70–99)
POTASSIUM: 3.3 meq/L — AB (ref 3.5–5.1)
SODIUM: 140 meq/L (ref 135–145)
TOTAL PROTEIN: 7.2 g/dL (ref 6.0–8.3)
Total Bilirubin: 0.5 mg/dL (ref 0.2–1.2)

## 2014-11-15 LAB — HEMOGLOBIN A1C: Hgb A1c MFr Bld: 5.2 % (ref 4.6–6.5)

## 2014-11-15 MED ORDER — FAMOTIDINE 20 MG PO TABS: 20.0000 mg | ORAL_TABLET | Freq: Two times a day (BID) | ORAL | Status: DC

## 2014-11-15 MED ORDER — SULFAMETHOXAZOLE-TRIMETHOPRIM 800-160 MG PO TABS: 1.0000 | ORAL_TABLET | Freq: Two times a day (BID) | ORAL | Status: DC

## 2014-11-15 MED ORDER — CLONIDINE HCL 0.2 MG/24HR TD PTWK: 0.2000 mg | MEDICATED_PATCH | TRANSDERMAL | Status: DC

## 2014-11-15 MED ORDER — FLUCONAZOLE 150 MG PO TABS: 150.0000 mg | ORAL_TABLET | ORAL | Status: DC

## 2014-11-15 NOTE — Progress Notes (Signed)
Pre visit review using our clinic review tool, if applicable. No additional management support is needed unless otherwise documented below in the visit note. 

## 2014-11-15 NOTE — Telephone Encounter (Signed)
Called and gave verbal orders 

## 2014-11-15 NOTE — Telephone Encounter (Signed)
Carolyn Fowler is calling to request verbal orber that we adjust the [place] orders to see the patient before the 21st

## 2014-11-15 NOTE — Patient Instructions (Signed)
We are checking a chest x-ray today to make sure the pneumonia is gone.   Take the flonase (fluticasone) 2 sprays in each nostril once a day for the next 1-2 weeks.  We have sent in a patch called clonidine which you replace once a week.  Stop taking the meloxicam and you can use tylenol for pain.   We are checking the blood work today as well to make sure being back on the medicines has not changed anything.   We have sent in bactrim for the spot under your arm, take 1 pill twice a day. We have sent in a yeast medicine to take with it. Take 1 pill every 3 days while on the antibiotic.

## 2014-11-16 DIAGNOSIS — L989 Disorder of the skin and subcutaneous tissue, unspecified: Secondary | ICD-10-CM

## 2014-11-16 DIAGNOSIS — R21 Rash and other nonspecific skin eruption: Secondary | ICD-10-CM | POA: Insufficient documentation

## 2014-11-16 NOTE — Progress Notes (Signed)
   Subjective:    Patient ID: Carolyn Fowler, female    DOB: 1945/07/22, 69 y.o.   MRN: [id number]  HPI The patient is a 69 YO female coming in for elevated blood pressures since a back surgery. She is still wearing soft support for her back. She was taken off medications after the surgery due to low BP. Since that time has had many high blood pressure readings at home. Has restarted all her blood pressure medicines at full dose without change in BP readings. Taking losartan, metoprolol, and hctz now. Some mild headaches, no chest pains, no nausea or confusion.  Also has another concern which is a small bump of infection under her right arm. Started 1-2 weeks ago. Has not drained out anything although they have tried. Somewhat painful. No fevers or chills. Not growing in size. Have not tried anything for it.   Review of Systems  Constitutional: Negative for fever, activity change, appetite change and fatigue.  Respiratory: Negative for cough, chest tightness, shortness of breath and wheezing.   Cardiovascular: Negative for chest pain, palpitations and leg swelling.  Gastrointestinal: Negative for abdominal pain, constipation and abdominal distention.  Musculoskeletal: Positive for back pain. Negative for arthralgias.  Skin: Positive for wound.  Neurological: Positive for headaches. Negative for dizziness, seizures, syncope, weakness and numbness.  Psychiatric/Behavioral: Negative.       Objective:   Physical Exam  Constitutional: She is oriented to person, place, and time. She appears well-developed and well-nourished.  HENT:  Head: Normocephalic and atraumatic.  Eyes: EOM are normal.  Neck: Normal range of motion.  Cardiovascular: Normal rate and regular rhythm.   Pulmonary/Chest: Effort normal and breath sounds normal. No respiratory distress. She has no wheezes. She has no rales.  Abdominal: Soft. Bowel sounds are normal. She exhibits no distension. There is no tenderness. There  is no rebound.  Musculoskeletal: She exhibits tenderness.  Back with scar in lumbar region, 80% healed and no signs of infection  Neurological: She is alert and oriented to person, place, and time.  Skin: Skin is warm and dry.  1 cm lesion under the right armpit with hard lump under the skin, no detected fluctuance, no redness or rash surrounding.    Filed Vitals:   11/15/14 1406  BP: 154/98  Pulse: 87  Temp: 98.8 F (37.1 C)  TempSrc: Oral  Resp: 12  Height: 5\' 3"  (1.6 m)  Weight: 244 lb (110.678 kg)  SpO2: 98%      Assessment & Plan:

## 2014-11-16 NOTE — Assessment & Plan Note (Signed)
Rx for bactrim as well as fluconazole (gets yeast infection with antibiotics). Not drainable today. Talked to them about the fact that if this does not resolve it may need to be drained at some point.

## 2014-11-16 NOTE — Assessment & Plan Note (Addendum)
Concern that the mobic from the back surgeon is worsening her BP and will stop. Continue hctz, losartan, metoprolol and reviewed instruction for use. Adding clonidine patch to be changed weekly. Hopefully once off NSAID this can be stopped. Checking CMP today and adjust as needed.

## 2014-11-18 DIAGNOSIS — M150 Primary generalized (osteo)arthritis: Secondary | ICD-10-CM | POA: Diagnosis not present

## 2014-11-18 DIAGNOSIS — I10 Essential (primary) hypertension: Secondary | ICD-10-CM | POA: Diagnosis not present

## 2014-11-18 DIAGNOSIS — J189 Pneumonia, unspecified organism: Secondary | ICD-10-CM | POA: Diagnosis not present

## 2014-11-18 DIAGNOSIS — Z4789 Encounter for other orthopedic aftercare: Secondary | ICD-10-CM | POA: Diagnosis not present

## 2014-11-19 DIAGNOSIS — Z4789 Encounter for other orthopedic aftercare: Secondary | ICD-10-CM | POA: Diagnosis not present

## 2014-11-19 DIAGNOSIS — M150 Primary generalized (osteo)arthritis: Secondary | ICD-10-CM | POA: Diagnosis not present

## 2014-11-19 DIAGNOSIS — J189 Pneumonia, unspecified organism: Secondary | ICD-10-CM | POA: Diagnosis not present

## 2014-11-19 DIAGNOSIS — I10 Essential (primary) hypertension: Secondary | ICD-10-CM | POA: Diagnosis not present

## 2014-11-20 DIAGNOSIS — M150 Primary generalized (osteo)arthritis: Secondary | ICD-10-CM | POA: Diagnosis not present

## 2014-11-20 DIAGNOSIS — J189 Pneumonia, unspecified organism: Secondary | ICD-10-CM | POA: Diagnosis not present

## 2014-11-20 DIAGNOSIS — Z4789 Encounter for other orthopedic aftercare: Secondary | ICD-10-CM | POA: Diagnosis not present

## 2014-11-20 DIAGNOSIS — I10 Essential (primary) hypertension: Secondary | ICD-10-CM | POA: Diagnosis not present

## 2014-11-21 DIAGNOSIS — J189 Pneumonia, unspecified organism: Secondary | ICD-10-CM | POA: Diagnosis not present

## 2014-11-21 DIAGNOSIS — M150 Primary generalized (osteo)arthritis: Secondary | ICD-10-CM | POA: Diagnosis not present

## 2014-11-21 DIAGNOSIS — I10 Essential (primary) hypertension: Secondary | ICD-10-CM | POA: Diagnosis not present

## 2014-11-21 DIAGNOSIS — Z4789 Encounter for other orthopedic aftercare: Secondary | ICD-10-CM | POA: Diagnosis not present

## 2014-11-22 ENCOUNTER — Telehealth: Payer: Self-pay | Admitting: Internal Medicine

## 2014-11-22 DIAGNOSIS — I10 Essential (primary) hypertension: Secondary | ICD-10-CM | POA: Diagnosis not present

## 2014-11-22 DIAGNOSIS — M150 Primary generalized (osteo)arthritis: Secondary | ICD-10-CM | POA: Diagnosis not present

## 2014-11-22 DIAGNOSIS — J189 Pneumonia, unspecified organism: Secondary | ICD-10-CM | POA: Diagnosis not present

## 2014-11-22 DIAGNOSIS — Z4789 Encounter for other orthopedic aftercare: Secondary | ICD-10-CM | POA: Diagnosis not present

## 2014-11-22 NOTE — Telephone Encounter (Signed)
Darnlle from [facility] [phone]  Need verbal for PT to extend 2 Week 4 1 week 1  Because bp has be elevated

## 2014-11-25 DIAGNOSIS — J189 Pneumonia, unspecified organism: Secondary | ICD-10-CM | POA: Diagnosis not present

## 2014-11-25 DIAGNOSIS — M150 Primary generalized (osteo)arthritis: Secondary | ICD-10-CM | POA: Diagnosis not present

## 2014-11-25 DIAGNOSIS — Z4789 Encounter for other orthopedic aftercare: Secondary | ICD-10-CM | POA: Diagnosis not present

## 2014-11-25 DIAGNOSIS — I10 Essential (primary) hypertension: Secondary | ICD-10-CM | POA: Diagnosis not present

## 2014-11-25 NOTE — Telephone Encounter (Signed)
Called and gave verbal orders 

## 2014-11-27 DIAGNOSIS — Z4789 Encounter for other orthopedic aftercare: Secondary | ICD-10-CM | POA: Diagnosis not present

## 2014-11-27 DIAGNOSIS — M150 Primary generalized (osteo)arthritis: Secondary | ICD-10-CM | POA: Diagnosis not present

## 2014-11-27 DIAGNOSIS — J189 Pneumonia, unspecified organism: Secondary | ICD-10-CM | POA: Diagnosis not present

## 2014-11-27 DIAGNOSIS — I10 Essential (primary) hypertension: Secondary | ICD-10-CM | POA: Diagnosis not present

## 2014-11-28 ENCOUNTER — Encounter: Payer: Self-pay | Admitting: Internal Medicine

## 2014-11-28 ENCOUNTER — Ambulatory Visit (INDEPENDENT_AMBULATORY_CARE_PROVIDER_SITE_OTHER): Payer: Medicare Other | Admitting: Internal Medicine

## 2014-11-28 VITALS — BP 122/98 | HR 114 | Temp 98.8°F | Resp 16 | Ht 63.0 in | Wt 239.1 lb

## 2014-11-28 DIAGNOSIS — Z4789 Encounter for other orthopedic aftercare: Secondary | ICD-10-CM | POA: Diagnosis not present

## 2014-11-28 DIAGNOSIS — M150 Primary generalized (osteo)arthritis: Secondary | ICD-10-CM | POA: Diagnosis not present

## 2014-11-28 DIAGNOSIS — J189 Pneumonia, unspecified organism: Secondary | ICD-10-CM | POA: Diagnosis not present

## 2014-11-28 DIAGNOSIS — I10 Essential (primary) hypertension: Secondary | ICD-10-CM

## 2014-11-28 MED ORDER — FLUTICASONE PROPIONATE 50 MCG/ACT NA SUSP: 2.0000 | Freq: Every day | NASAL | Status: DC

## 2014-11-28 MED ORDER — METOPROLOL TARTRATE 50 MG PO TABS: 75.0000 mg | ORAL_TABLET | Freq: Two times a day (BID) | ORAL | Status: DC

## 2014-11-28 NOTE — Progress Notes (Signed)
   Subjective:    Patient ID: Carolyn Fowler, female    DOB: 1945-07-31, 69 y.o.   MRN: [id number]  HPI The patient is a 69 YO female coming in for follow up of her blood pressure. Since last visit they have stopped taking all NSAIDs, started the clonidine patch. Her pressures have been slightly better overall but still the bottom number has been high. The top number has been 140-150 at the highest but the bottom number is still 90-100 most of the time. This has been affecting her ability to do PT.   Review of Systems  Constitutional: Negative for fever, activity change, appetite change and fatigue.  Respiratory: Negative for cough, chest tightness, shortness of breath and wheezing.   Cardiovascular: Negative for chest pain, palpitations and leg swelling.  Gastrointestinal: Negative for abdominal pain, constipation and abdominal distention.  Musculoskeletal: Positive for back pain. Negative for arthralgias.  Neurological: Negative for dizziness, seizures, syncope, weakness, numbness and headaches.  Psychiatric/Behavioral: Negative.       Objective:   Physical Exam  Constitutional: She is oriented to person, place, and time. She appears well-developed and well-nourished.  HENT:  Head: Normocephalic and atraumatic.  Eyes: EOM are normal.  Neck: Normal range of motion.  Cardiovascular: Normal rate and regular rhythm.   Pulmonary/Chest: Effort normal and breath sounds normal. No respiratory distress. She has no wheezes. She has no rales.  Abdominal: Soft. Bowel sounds are normal. She exhibits no distension. There is no tenderness. There is no rebound.  Musculoskeletal: She exhibits tenderness.  Neurological: She is alert and oriented to person, place, and time.  Skin: Skin is warm and dry.   Filed Vitals:   11/28/14 0855 11/28/14 0920  BP: 148/100 122/98  Pulse: 114   Temp: 98.8 F (37.1 C)   TempSrc: Oral   Resp: 16   Height: 5\' 3"  (1.6 m)   Weight: 239 lb 1.9 oz (108.464  kg)   SpO2: 98%       Assessment & Plan:

## 2014-11-28 NOTE — Progress Notes (Signed)
Pre visit review using our clinic review tool, if applicable. No additional management support is needed unless otherwise documented below in the visit note. 

## 2014-11-28 NOTE — Patient Instructions (Signed)
We will have you increase the metoprolol to 75 mg (1 and a half pills) twice a day for the blood pressure. We are getting closer and hopefully this will bring it down. I anticipate that this is only going to be temporary with the blood pressure and hopefully will go back to normal once we get you better.   We have sent in the nosespray to the pharmacy for the allergies and you can use 2 sprays in each nostril once a day.

## 2014-11-28 NOTE — Assessment & Plan Note (Signed)
BP still moderately elevated with addition of the clonidine patch and off NSAIDs. Will increase metoprolol to 75 mg BID (HR still high at most visits and 70-80s at home). Continue clonidine, losartan, HCTZ. Check labs at next visit in 3-4 weeks. No signs of tertiary damage.

## 2014-12-02 DIAGNOSIS — M150 Primary generalized (osteo)arthritis: Secondary | ICD-10-CM | POA: Diagnosis not present

## 2014-12-02 DIAGNOSIS — J189 Pneumonia, unspecified organism: Secondary | ICD-10-CM | POA: Diagnosis not present

## 2014-12-02 DIAGNOSIS — Z4789 Encounter for other orthopedic aftercare: Secondary | ICD-10-CM | POA: Diagnosis not present

## 2014-12-02 DIAGNOSIS — I10 Essential (primary) hypertension: Secondary | ICD-10-CM | POA: Diagnosis not present

## 2014-12-03 DIAGNOSIS — M150 Primary generalized (osteo)arthritis: Secondary | ICD-10-CM | POA: Diagnosis not present

## 2014-12-03 DIAGNOSIS — J189 Pneumonia, unspecified organism: Secondary | ICD-10-CM | POA: Diagnosis not present

## 2014-12-03 DIAGNOSIS — I10 Essential (primary) hypertension: Secondary | ICD-10-CM | POA: Diagnosis not present

## 2014-12-03 DIAGNOSIS — Z4789 Encounter for other orthopedic aftercare: Secondary | ICD-10-CM | POA: Diagnosis not present

## 2014-12-05 DIAGNOSIS — Z4789 Encounter for other orthopedic aftercare: Secondary | ICD-10-CM | POA: Diagnosis not present

## 2014-12-05 DIAGNOSIS — M150 Primary generalized (osteo)arthritis: Secondary | ICD-10-CM | POA: Diagnosis not present

## 2014-12-05 DIAGNOSIS — J189 Pneumonia, unspecified organism: Secondary | ICD-10-CM | POA: Diagnosis not present

## 2014-12-05 DIAGNOSIS — I10 Essential (primary) hypertension: Secondary | ICD-10-CM | POA: Diagnosis not present

## 2014-12-09 DIAGNOSIS — Z4789 Encounter for other orthopedic aftercare: Secondary | ICD-10-CM | POA: Diagnosis not present

## 2014-12-09 DIAGNOSIS — J189 Pneumonia, unspecified organism: Secondary | ICD-10-CM | POA: Diagnosis not present

## 2014-12-09 DIAGNOSIS — I10 Essential (primary) hypertension: Secondary | ICD-10-CM | POA: Diagnosis not present

## 2014-12-09 DIAGNOSIS — M150 Primary generalized (osteo)arthritis: Secondary | ICD-10-CM | POA: Diagnosis not present

## 2014-12-10 DIAGNOSIS — M150 Primary generalized (osteo)arthritis: Secondary | ICD-10-CM | POA: Diagnosis not present

## 2014-12-10 DIAGNOSIS — J189 Pneumonia, unspecified organism: Secondary | ICD-10-CM | POA: Diagnosis not present

## 2014-12-10 DIAGNOSIS — Z4789 Encounter for other orthopedic aftercare: Secondary | ICD-10-CM | POA: Diagnosis not present

## 2014-12-10 DIAGNOSIS — I10 Essential (primary) hypertension: Secondary | ICD-10-CM | POA: Diagnosis not present

## 2014-12-12 ENCOUNTER — Telehealth: Payer: Self-pay | Admitting: Internal Medicine

## 2014-12-12 DIAGNOSIS — Z4789 Encounter for other orthopedic aftercare: Secondary | ICD-10-CM | POA: Diagnosis not present

## 2014-12-12 DIAGNOSIS — J189 Pneumonia, unspecified organism: Secondary | ICD-10-CM | POA: Diagnosis not present

## 2014-12-12 DIAGNOSIS — M150 Primary generalized (osteo)arthritis: Secondary | ICD-10-CM | POA: Diagnosis not present

## 2014-12-12 DIAGNOSIS — I10 Essential (primary) hypertension: Secondary | ICD-10-CM | POA: Diagnosis not present

## 2014-12-12 NOTE — Telephone Encounter (Signed)
Heads up:  Patient is coming in Monday.  This week BP has been running between, top number  150-154 and bottom has been 102-110 during therapy.  Because of this have not been able to give much therapy.  Does not need call back.

## 2014-12-13 DIAGNOSIS — J189 Pneumonia, unspecified organism: Secondary | ICD-10-CM | POA: Diagnosis not present

## 2014-12-13 DIAGNOSIS — M150 Primary generalized (osteo)arthritis: Secondary | ICD-10-CM | POA: Diagnosis not present

## 2014-12-13 DIAGNOSIS — I10 Essential (primary) hypertension: Secondary | ICD-10-CM | POA: Diagnosis not present

## 2014-12-13 DIAGNOSIS — Z4789 Encounter for other orthopedic aftercare: Secondary | ICD-10-CM | POA: Diagnosis not present

## 2014-12-16 ENCOUNTER — Ambulatory Visit (INDEPENDENT_AMBULATORY_CARE_PROVIDER_SITE_OTHER): Payer: Medicare Other | Admitting: Internal Medicine

## 2014-12-16 ENCOUNTER — Encounter: Payer: Self-pay | Admitting: Internal Medicine

## 2014-12-16 VITALS — BP 162/98 | HR 87 | Temp 98.6°F | Resp 12 | Ht 63.0 in | Wt 242.0 lb

## 2014-12-16 DIAGNOSIS — I10 Essential (primary) hypertension: Secondary | ICD-10-CM | POA: Diagnosis not present

## 2014-12-16 DIAGNOSIS — M150 Primary generalized (osteo)arthritis: Secondary | ICD-10-CM | POA: Diagnosis not present

## 2014-12-16 DIAGNOSIS — J189 Pneumonia, unspecified organism: Secondary | ICD-10-CM | POA: Diagnosis not present

## 2014-12-16 DIAGNOSIS — Z4789 Encounter for other orthopedic aftercare: Secondary | ICD-10-CM | POA: Diagnosis not present

## 2014-12-16 MED ORDER — AMLODIPINE BESYLATE 10 MG PO TABS: 10.0000 mg | ORAL_TABLET | Freq: Every day | ORAL | Status: DC

## 2014-12-16 NOTE — Patient Instructions (Signed)
We will have you stop the patch for blood pressure 2 days after you start taking the new blood pressure medicine. The new medicine is called amlodipine and you take 1 pill daily.   Keep taking the metoprolol, the HCTZ, and the losartan.   Call us back in about 1 week with how the pressure is doing. Our goal for your is around 150/90.

## 2014-12-16 NOTE — Progress Notes (Signed)
Pre visit review using our clinic review tool, if applicable. No additional management support is needed unless otherwise documented below in the visit note. 

## 2014-12-16 NOTE — Assessment & Plan Note (Signed)
She has tolerated the increase in the metoprolol but has not taken today for [place] to assess her HR. No dizziness with standing. They want to stop the patches as she is having a mild skin reaction. Will stop clonidine patch and start amlodipine 10 mg daily. Continue lopressor 75 mg BID and HCTZ and losartan. Signed extension of her home PT. See her back in 1-2 months.

## 2014-12-16 NOTE — Progress Notes (Signed)
   Subjective:    Patient ID: Carolyn Fowler, female    DOB: [birth year], 69 y.o.   MRN: [id number]  HPI The patient is a 69 YO female coming in for BP check. We did increase her metoprolol at last visit and she was able to make that change. Still having high blood pressures with PT and is not able to work on her back. This is causing her to be less mobile. Has not taken any of her medications yet this morning. Wants to know if we can stop any medicines if we add a new one today. Denies headaches, chest pains, SOB, abdominal pain. Is struggling with back pain and leg weakness due to lack of exercise.   Review of Systems  Constitutional: Negative for fever, activity change, appetite change and fatigue.  HENT: Negative.   Respiratory: Negative for cough, chest tightness, shortness of breath and wheezing.   Cardiovascular: Negative for chest pain, palpitations and leg swelling.  Gastrointestinal: Negative for abdominal pain, constipation and abdominal distention.  Musculoskeletal: Positive for back pain. Negative for arthralgias.  Neurological: Negative for dizziness, seizures, syncope, weakness, numbness and headaches.  Psychiatric/Behavioral: Negative.       Objective:   Physical Exam  Constitutional: She is oriented to person, place, and time. She appears well-developed and well-nourished.  HENT:  Head: Normocephalic and atraumatic.  Eyes: EOM are normal.  Neck: Normal range of motion.  Cardiovascular: Normal rate and regular rhythm.   Pulmonary/Chest: Effort normal and breath sounds normal. No respiratory distress. She has no wheezes. She has no rales.  Abdominal: Soft. Bowel sounds are normal. She exhibits no distension. There is no tenderness. There is no rebound.  Musculoskeletal: She exhibits tenderness.  Neurological: She is alert and oriented to person, place, and time.  Skin: Skin is warm and dry.   Filed Vitals:   12/16/14 0912  BP: 162/98  Pulse: 87  Temp: 98.6 F  (37 C)  TempSrc: Oral  Resp: 12  Height: 5\' 3"  (1.6 m)  Weight: 242 lb (109.77 kg)  SpO2: 97%      Assessment & Plan:

## 2014-12-17 DIAGNOSIS — M150 Primary generalized (osteo)arthritis: Secondary | ICD-10-CM | POA: Diagnosis not present

## 2014-12-17 DIAGNOSIS — I10 Essential (primary) hypertension: Secondary | ICD-10-CM | POA: Diagnosis not present

## 2014-12-17 DIAGNOSIS — Z4789 Encounter for other orthopedic aftercare: Secondary | ICD-10-CM | POA: Diagnosis not present

## 2014-12-17 DIAGNOSIS — J189 Pneumonia, unspecified organism: Secondary | ICD-10-CM | POA: Diagnosis not present

## 2014-12-18 DIAGNOSIS — M150 Primary generalized (osteo)arthritis: Secondary | ICD-10-CM | POA: Diagnosis not present

## 2014-12-18 DIAGNOSIS — Z4789 Encounter for other orthopedic aftercare: Secondary | ICD-10-CM | POA: Diagnosis not present

## 2014-12-18 DIAGNOSIS — I10 Essential (primary) hypertension: Secondary | ICD-10-CM | POA: Diagnosis not present

## 2014-12-18 DIAGNOSIS — J189 Pneumonia, unspecified organism: Secondary | ICD-10-CM | POA: Diagnosis not present

## 2014-12-19 DIAGNOSIS — J189 Pneumonia, unspecified organism: Secondary | ICD-10-CM | POA: Diagnosis not present

## 2014-12-19 DIAGNOSIS — Z4789 Encounter for other orthopedic aftercare: Secondary | ICD-10-CM | POA: Diagnosis not present

## 2014-12-19 DIAGNOSIS — M150 Primary generalized (osteo)arthritis: Secondary | ICD-10-CM | POA: Diagnosis not present

## 2014-12-19 DIAGNOSIS — I10 Essential (primary) hypertension: Secondary | ICD-10-CM | POA: Diagnosis not present

## 2014-12-20 ENCOUNTER — Telehealth: Payer: Self-pay | Admitting: Internal Medicine

## 2014-12-20 DIAGNOSIS — I10 Essential (primary) hypertension: Secondary | ICD-10-CM | POA: Diagnosis not present

## 2014-12-20 DIAGNOSIS — J189 Pneumonia, unspecified organism: Secondary | ICD-10-CM | POA: Diagnosis not present

## 2014-12-20 DIAGNOSIS — Z4789 Encounter for other orthopedic aftercare: Secondary | ICD-10-CM | POA: Diagnosis not present

## 2014-12-20 DIAGNOSIS — M150 Primary generalized (osteo)arthritis: Secondary | ICD-10-CM | POA: Diagnosis not present

## 2014-12-20 NOTE — Telephone Encounter (Signed)
I would advise to resume the patch.

## 2014-12-20 NOTE — Telephone Encounter (Signed)
Robin from [facility] called to give you pts weekly bp update  On Weds her bp read 144/89 with patch on She took patch off around 6pm that evening Today her bp read 138/100 without the patch since weds evening.

## 2014-12-20 NOTE — Telephone Encounter (Signed)
Patient will start the patch again.

## 2014-12-23 ENCOUNTER — Telehealth: Payer: Self-pay | Admitting: *Deleted

## 2014-12-23 DIAGNOSIS — I10 Essential (primary) hypertension: Secondary | ICD-10-CM | POA: Diagnosis not present

## 2014-12-23 DIAGNOSIS — Z4789 Encounter for other orthopedic aftercare: Secondary | ICD-10-CM | POA: Diagnosis not present

## 2014-12-23 DIAGNOSIS — M150 Primary generalized (osteo)arthritis: Secondary | ICD-10-CM | POA: Diagnosis not present

## 2014-12-23 DIAGNOSIS — J189 Pneumonia, unspecified organism: Secondary | ICD-10-CM | POA: Diagnosis not present

## 2014-12-23 NOTE — Telephone Encounter (Signed)
Fine to stay off the patch and fine to recert 60 days. Please remind her to take her blood pressure medicine same time daily for best results.

## 2014-12-23 NOTE — Telephone Encounter (Signed)
Requesting verbal to recert for another 60 days. Also pt states that she was advise to start back on the klonopin patches week or so ago bcz BP was elevated. Pt didn't start over the weekend BP been running 130's/80. Saw pt today BP was 142/96, but after she too losartan it came down to 138/90. Pt is wanting to conform if she really need to start patch...Johny Chess

## 2014-12-24 NOTE — Telephone Encounter (Signed)
Called Tracy no answer LMOM with md response...Carolyn Fowler

## 2014-12-25 ENCOUNTER — Ambulatory Visit: Payer: Medicare Other | Admitting: Internal Medicine

## 2014-12-25 DIAGNOSIS — Z4789 Encounter for other orthopedic aftercare: Secondary | ICD-10-CM | POA: Diagnosis not present

## 2014-12-25 DIAGNOSIS — Z7982 Long term (current) use of aspirin: Secondary | ICD-10-CM | POA: Diagnosis not present

## 2014-12-25 DIAGNOSIS — I10 Essential (primary) hypertension: Secondary | ICD-10-CM | POA: Diagnosis not present

## 2014-12-25 DIAGNOSIS — Z8701 Personal history of pneumonia (recurrent): Secondary | ICD-10-CM | POA: Diagnosis not present

## 2014-12-25 DIAGNOSIS — Z981 Arthrodesis status: Secondary | ICD-10-CM | POA: Diagnosis not present

## 2014-12-25 DIAGNOSIS — M150 Primary generalized (osteo)arthritis: Secondary | ICD-10-CM | POA: Diagnosis not present

## 2014-12-26 DIAGNOSIS — I10 Essential (primary) hypertension: Secondary | ICD-10-CM | POA: Diagnosis not present

## 2014-12-26 DIAGNOSIS — M150 Primary generalized (osteo)arthritis: Secondary | ICD-10-CM | POA: Diagnosis not present

## 2014-12-26 DIAGNOSIS — Z7982 Long term (current) use of aspirin: Secondary | ICD-10-CM | POA: Diagnosis not present

## 2014-12-26 DIAGNOSIS — Z8701 Personal history of pneumonia (recurrent): Secondary | ICD-10-CM | POA: Diagnosis not present

## 2014-12-26 DIAGNOSIS — Z981 Arthrodesis status: Secondary | ICD-10-CM | POA: Diagnosis not present

## 2014-12-26 DIAGNOSIS — Z4789 Encounter for other orthopedic aftercare: Secondary | ICD-10-CM | POA: Diagnosis not present

## 2014-12-27 DIAGNOSIS — M150 Primary generalized (osteo)arthritis: Secondary | ICD-10-CM | POA: Diagnosis not present

## 2014-12-27 DIAGNOSIS — Z981 Arthrodesis status: Secondary | ICD-10-CM | POA: Diagnosis not present

## 2014-12-27 DIAGNOSIS — Z8701 Personal history of pneumonia (recurrent): Secondary | ICD-10-CM | POA: Diagnosis not present

## 2014-12-27 DIAGNOSIS — Z7982 Long term (current) use of aspirin: Secondary | ICD-10-CM | POA: Diagnosis not present

## 2014-12-27 DIAGNOSIS — I10 Essential (primary) hypertension: Secondary | ICD-10-CM | POA: Diagnosis not present

## 2014-12-27 DIAGNOSIS — Z4789 Encounter for other orthopedic aftercare: Secondary | ICD-10-CM | POA: Diagnosis not present

## 2014-12-30 DIAGNOSIS — Z8701 Personal history of pneumonia (recurrent): Secondary | ICD-10-CM | POA: Diagnosis not present

## 2014-12-30 DIAGNOSIS — I10 Essential (primary) hypertension: Secondary | ICD-10-CM | POA: Diagnosis not present

## 2014-12-30 DIAGNOSIS — M150 Primary generalized (osteo)arthritis: Secondary | ICD-10-CM | POA: Diagnosis not present

## 2014-12-30 DIAGNOSIS — Z981 Arthrodesis status: Secondary | ICD-10-CM | POA: Diagnosis not present

## 2014-12-30 DIAGNOSIS — Z4789 Encounter for other orthopedic aftercare: Secondary | ICD-10-CM | POA: Diagnosis not present

## 2014-12-30 DIAGNOSIS — Z7982 Long term (current) use of aspirin: Secondary | ICD-10-CM | POA: Diagnosis not present

## 2014-12-31 DIAGNOSIS — I10 Essential (primary) hypertension: Secondary | ICD-10-CM | POA: Diagnosis not present

## 2014-12-31 DIAGNOSIS — Z4789 Encounter for other orthopedic aftercare: Secondary | ICD-10-CM | POA: Diagnosis not present

## 2014-12-31 DIAGNOSIS — Z981 Arthrodesis status: Secondary | ICD-10-CM | POA: Diagnosis not present

## 2014-12-31 DIAGNOSIS — Z7982 Long term (current) use of aspirin: Secondary | ICD-10-CM | POA: Diagnosis not present

## 2014-12-31 DIAGNOSIS — M150 Primary generalized (osteo)arthritis: Secondary | ICD-10-CM | POA: Diagnosis not present

## 2014-12-31 DIAGNOSIS — Z8701 Personal history of pneumonia (recurrent): Secondary | ICD-10-CM | POA: Diagnosis not present

## 2015-01-02 DIAGNOSIS — Z8701 Personal history of pneumonia (recurrent): Secondary | ICD-10-CM | POA: Diagnosis not present

## 2015-01-02 DIAGNOSIS — Z981 Arthrodesis status: Secondary | ICD-10-CM | POA: Diagnosis not present

## 2015-01-02 DIAGNOSIS — M150 Primary generalized (osteo)arthritis: Secondary | ICD-10-CM | POA: Diagnosis not present

## 2015-01-02 DIAGNOSIS — Z7982 Long term (current) use of aspirin: Secondary | ICD-10-CM | POA: Diagnosis not present

## 2015-01-02 DIAGNOSIS — I10 Essential (primary) hypertension: Secondary | ICD-10-CM | POA: Diagnosis not present

## 2015-01-02 DIAGNOSIS — Z4789 Encounter for other orthopedic aftercare: Secondary | ICD-10-CM | POA: Diagnosis not present

## 2015-01-07 DIAGNOSIS — Z7982 Long term (current) use of aspirin: Secondary | ICD-10-CM | POA: Diagnosis not present

## 2015-01-07 DIAGNOSIS — I10 Essential (primary) hypertension: Secondary | ICD-10-CM | POA: Diagnosis not present

## 2015-01-07 DIAGNOSIS — M150 Primary generalized (osteo)arthritis: Secondary | ICD-10-CM | POA: Diagnosis not present

## 2015-01-07 DIAGNOSIS — Z8701 Personal history of pneumonia (recurrent): Secondary | ICD-10-CM | POA: Diagnosis not present

## 2015-01-07 DIAGNOSIS — Z981 Arthrodesis status: Secondary | ICD-10-CM | POA: Diagnosis not present

## 2015-01-07 DIAGNOSIS — Z4789 Encounter for other orthopedic aftercare: Secondary | ICD-10-CM | POA: Diagnosis not present

## 2015-01-08 DIAGNOSIS — Z981 Arthrodesis status: Secondary | ICD-10-CM | POA: Diagnosis not present

## 2015-01-08 DIAGNOSIS — Z8701 Personal history of pneumonia (recurrent): Secondary | ICD-10-CM | POA: Diagnosis not present

## 2015-01-08 DIAGNOSIS — Z4789 Encounter for other orthopedic aftercare: Secondary | ICD-10-CM | POA: Diagnosis not present

## 2015-01-08 DIAGNOSIS — M150 Primary generalized (osteo)arthritis: Secondary | ICD-10-CM | POA: Diagnosis not present

## 2015-01-08 DIAGNOSIS — Z7982 Long term (current) use of aspirin: Secondary | ICD-10-CM | POA: Diagnosis not present

## 2015-01-08 DIAGNOSIS — I10 Essential (primary) hypertension: Secondary | ICD-10-CM | POA: Diagnosis not present

## 2015-01-09 DIAGNOSIS — Z8701 Personal history of pneumonia (recurrent): Secondary | ICD-10-CM | POA: Diagnosis not present

## 2015-01-09 DIAGNOSIS — Z7982 Long term (current) use of aspirin: Secondary | ICD-10-CM | POA: Diagnosis not present

## 2015-01-09 DIAGNOSIS — Z4789 Encounter for other orthopedic aftercare: Secondary | ICD-10-CM | POA: Diagnosis not present

## 2015-01-09 DIAGNOSIS — I10 Essential (primary) hypertension: Secondary | ICD-10-CM | POA: Diagnosis not present

## 2015-01-09 DIAGNOSIS — Z981 Arthrodesis status: Secondary | ICD-10-CM | POA: Diagnosis not present

## 2015-01-09 DIAGNOSIS — M150 Primary generalized (osteo)arthritis: Secondary | ICD-10-CM | POA: Diagnosis not present

## 2015-01-10 DIAGNOSIS — Z7982 Long term (current) use of aspirin: Secondary | ICD-10-CM | POA: Diagnosis not present

## 2015-01-10 DIAGNOSIS — M419 Scoliosis, unspecified: Secondary | ICD-10-CM | POA: Diagnosis not present

## 2015-01-10 DIAGNOSIS — M150 Primary generalized (osteo)arthritis: Secondary | ICD-10-CM | POA: Diagnosis not present

## 2015-01-10 DIAGNOSIS — Z8701 Personal history of pneumonia (recurrent): Secondary | ICD-10-CM | POA: Diagnosis not present

## 2015-01-10 DIAGNOSIS — I10 Essential (primary) hypertension: Secondary | ICD-10-CM | POA: Diagnosis not present

## 2015-01-10 DIAGNOSIS — M4316 Spondylolisthesis, lumbar region: Secondary | ICD-10-CM | POA: Diagnosis not present

## 2015-01-10 DIAGNOSIS — Z4789 Encounter for other orthopedic aftercare: Secondary | ICD-10-CM | POA: Diagnosis not present

## 2015-01-10 DIAGNOSIS — Z981 Arthrodesis status: Secondary | ICD-10-CM | POA: Diagnosis not present

## 2015-01-10 DIAGNOSIS — M4806 Spinal stenosis, lumbar region: Secondary | ICD-10-CM | POA: Diagnosis not present

## 2015-01-10 DIAGNOSIS — M549 Dorsalgia, unspecified: Secondary | ICD-10-CM | POA: Diagnosis not present

## 2015-01-10 DIAGNOSIS — M25512 Pain in left shoulder: Secondary | ICD-10-CM | POA: Diagnosis not present

## 2015-01-13 DIAGNOSIS — I10 Essential (primary) hypertension: Secondary | ICD-10-CM | POA: Diagnosis not present

## 2015-01-13 DIAGNOSIS — M150 Primary generalized (osteo)arthritis: Secondary | ICD-10-CM | POA: Diagnosis not present

## 2015-01-13 DIAGNOSIS — Z7982 Long term (current) use of aspirin: Secondary | ICD-10-CM | POA: Diagnosis not present

## 2015-01-13 DIAGNOSIS — Z981 Arthrodesis status: Secondary | ICD-10-CM | POA: Diagnosis not present

## 2015-01-13 DIAGNOSIS — Z4789 Encounter for other orthopedic aftercare: Secondary | ICD-10-CM | POA: Diagnosis not present

## 2015-01-13 DIAGNOSIS — Z8701 Personal history of pneumonia (recurrent): Secondary | ICD-10-CM | POA: Diagnosis not present

## 2015-01-14 DIAGNOSIS — Z7982 Long term (current) use of aspirin: Secondary | ICD-10-CM | POA: Diagnosis not present

## 2015-01-14 DIAGNOSIS — Z8701 Personal history of pneumonia (recurrent): Secondary | ICD-10-CM | POA: Diagnosis not present

## 2015-01-14 DIAGNOSIS — Z4789 Encounter for other orthopedic aftercare: Secondary | ICD-10-CM | POA: Diagnosis not present

## 2015-01-14 DIAGNOSIS — Z981 Arthrodesis status: Secondary | ICD-10-CM | POA: Diagnosis not present

## 2015-01-14 DIAGNOSIS — I10 Essential (primary) hypertension: Secondary | ICD-10-CM | POA: Diagnosis not present

## 2015-01-14 DIAGNOSIS — M150 Primary generalized (osteo)arthritis: Secondary | ICD-10-CM | POA: Diagnosis not present

## 2015-01-15 DIAGNOSIS — Z4789 Encounter for other orthopedic aftercare: Secondary | ICD-10-CM | POA: Diagnosis not present

## 2015-01-16 DIAGNOSIS — Z8701 Personal history of pneumonia (recurrent): Secondary | ICD-10-CM | POA: Diagnosis not present

## 2015-01-16 DIAGNOSIS — I10 Essential (primary) hypertension: Secondary | ICD-10-CM | POA: Diagnosis not present

## 2015-01-16 DIAGNOSIS — M150 Primary generalized (osteo)arthritis: Secondary | ICD-10-CM | POA: Diagnosis not present

## 2015-01-16 DIAGNOSIS — Z4789 Encounter for other orthopedic aftercare: Secondary | ICD-10-CM | POA: Diagnosis not present

## 2015-01-16 DIAGNOSIS — Z981 Arthrodesis status: Secondary | ICD-10-CM | POA: Diagnosis not present

## 2015-01-16 DIAGNOSIS — Z7982 Long term (current) use of aspirin: Secondary | ICD-10-CM | POA: Diagnosis not present

## 2015-01-20 DIAGNOSIS — Z8701 Personal history of pneumonia (recurrent): Secondary | ICD-10-CM | POA: Diagnosis not present

## 2015-01-20 DIAGNOSIS — M150 Primary generalized (osteo)arthritis: Secondary | ICD-10-CM | POA: Diagnosis not present

## 2015-01-20 DIAGNOSIS — Z981 Arthrodesis status: Secondary | ICD-10-CM | POA: Diagnosis not present

## 2015-01-20 DIAGNOSIS — Z7982 Long term (current) use of aspirin: Secondary | ICD-10-CM | POA: Diagnosis not present

## 2015-01-20 DIAGNOSIS — Z4789 Encounter for other orthopedic aftercare: Secondary | ICD-10-CM | POA: Diagnosis not present

## 2015-01-20 DIAGNOSIS — I10 Essential (primary) hypertension: Secondary | ICD-10-CM | POA: Diagnosis not present

## 2015-01-22 DIAGNOSIS — M6281 Muscle weakness (generalized): Secondary | ICD-10-CM | POA: Diagnosis not present

## 2015-01-22 DIAGNOSIS — M25551 Pain in right hip: Secondary | ICD-10-CM | POA: Diagnosis not present

## 2015-01-24 DIAGNOSIS — M6281 Muscle weakness (generalized): Secondary | ICD-10-CM | POA: Diagnosis not present

## 2015-01-24 DIAGNOSIS — M25551 Pain in right hip: Secondary | ICD-10-CM | POA: Diagnosis not present

## 2015-01-29 DIAGNOSIS — M6281 Muscle weakness (generalized): Secondary | ICD-10-CM | POA: Diagnosis not present

## 2015-01-29 DIAGNOSIS — M25551 Pain in right hip: Secondary | ICD-10-CM | POA: Diagnosis not present

## 2015-01-31 DIAGNOSIS — M25551 Pain in right hip: Secondary | ICD-10-CM | POA: Diagnosis not present

## 2015-01-31 DIAGNOSIS — M6281 Muscle weakness (generalized): Secondary | ICD-10-CM | POA: Diagnosis not present

## 2015-02-03 DIAGNOSIS — M6281 Muscle weakness (generalized): Secondary | ICD-10-CM | POA: Diagnosis not present

## 2015-02-03 DIAGNOSIS — M25551 Pain in right hip: Secondary | ICD-10-CM | POA: Diagnosis not present

## 2015-02-04 DIAGNOSIS — M6281 Muscle weakness (generalized): Secondary | ICD-10-CM | POA: Diagnosis not present

## 2015-02-04 DIAGNOSIS — M25551 Pain in right hip: Secondary | ICD-10-CM | POA: Diagnosis not present

## 2015-02-05 DIAGNOSIS — M6281 Muscle weakness (generalized): Secondary | ICD-10-CM | POA: Diagnosis not present

## 2015-02-05 DIAGNOSIS — M25551 Pain in right hip: Secondary | ICD-10-CM | POA: Diagnosis not present

## 2015-02-11 DIAGNOSIS — M6281 Muscle weakness (generalized): Secondary | ICD-10-CM | POA: Diagnosis not present

## 2015-02-11 DIAGNOSIS — M25551 Pain in right hip: Secondary | ICD-10-CM | POA: Diagnosis not present

## 2015-02-12 DIAGNOSIS — M25551 Pain in right hip: Secondary | ICD-10-CM | POA: Diagnosis not present

## 2015-02-12 DIAGNOSIS — M6281 Muscle weakness (generalized): Secondary | ICD-10-CM | POA: Diagnosis not present

## 2015-02-13 DIAGNOSIS — M25551 Pain in right hip: Secondary | ICD-10-CM | POA: Diagnosis not present

## 2015-02-13 DIAGNOSIS — M6281 Muscle weakness (generalized): Secondary | ICD-10-CM | POA: Diagnosis not present

## 2015-02-17 DIAGNOSIS — M6281 Muscle weakness (generalized): Secondary | ICD-10-CM | POA: Diagnosis not present

## 2015-02-17 DIAGNOSIS — M25551 Pain in right hip: Secondary | ICD-10-CM | POA: Diagnosis not present

## 2015-02-18 ENCOUNTER — Ambulatory Visit (INDEPENDENT_AMBULATORY_CARE_PROVIDER_SITE_OTHER): Payer: Medicare Other | Admitting: Internal Medicine

## 2015-02-18 ENCOUNTER — Encounter: Payer: Self-pay | Admitting: Internal Medicine

## 2015-02-18 VITALS — BP 110/82 | HR 67 | Temp 98.7°F | Resp 12 | Ht 63.0 in | Wt 249.4 lb

## 2015-02-18 DIAGNOSIS — M25551 Pain in right hip: Secondary | ICD-10-CM | POA: Diagnosis not present

## 2015-02-18 DIAGNOSIS — I10 Essential (primary) hypertension: Secondary | ICD-10-CM

## 2015-02-18 DIAGNOSIS — Z23 Encounter for immunization: Secondary | ICD-10-CM | POA: Diagnosis not present

## 2015-02-18 DIAGNOSIS — M6281 Muscle weakness (generalized): Secondary | ICD-10-CM | POA: Diagnosis not present

## 2015-02-18 DIAGNOSIS — Z86010 Personal history of colonic polyps: Secondary | ICD-10-CM | POA: Insufficient documentation

## 2015-02-18 NOTE — Patient Instructions (Signed)
We have given you the pneumonia booster shot today.   We will also get you in with the GI doctor to get the colonoscopy in the new year.  Come back in about 3-5 months to check on the blood pressure and the weight.   Keep up with the therapy as you are doing well!

## 2015-02-18 NOTE — Progress Notes (Signed)
   Subjective:    Patient ID: Carolyn Fowler, female    DOB: January 12, 1946, 69 y.o.   MRN: [id number]  HPI The patient is a 69 YO female coming in for BP follow up. She did make the increase we recommended to go to 1.5 metoprolol BID for better BP control. She noticed that the BP fell so she did go back down to 1 pill BID. She has been having readings about 120/80s at home. No headaches or chest pains.   Review of Systems  Constitutional: Negative for fever, activity change, appetite change and fatigue.  HENT: Negative.   Respiratory: Negative for cough, chest tightness, shortness of breath and wheezing.   Cardiovascular: Negative for chest pain, palpitations and leg swelling.  Gastrointestinal: Negative for abdominal pain, constipation and abdominal distention.  Musculoskeletal: Positive for back pain. Negative for arthralgias.  Neurological: Negative for dizziness, seizures, syncope, weakness, numbness and headaches.  Psychiatric/Behavioral: Negative.       Objective:   Physical Exam  Constitutional: She is oriented to person, place, and time. She appears well-developed and well-nourished.  HENT:  Head: Normocephalic and atraumatic.  Eyes: EOM are normal.  Neck: Normal range of motion.  Cardiovascular: Normal rate and regular rhythm.   Pulmonary/Chest: Effort normal and breath sounds normal. No respiratory distress. She has no wheezes. She has no rales.  Abdominal: Soft. Bowel sounds are normal. She exhibits no distension. There is no tenderness. There is no rebound.  Neurological: She is alert and oriented to person, place, and time.  Skin: Skin is warm and dry.   Filed Vitals:   02/18/15 0806  BP: 110/82  Pulse: 67  Temp: 98.7 F (37.1 C)  TempSrc: Oral  Resp: 12  Height: 5\' 3"  (1.6 m)  Weight: 249 lb 6.4 oz (113.127 kg)  SpO2: 94%      Assessment & Plan:  Prevnar 13 given at visit.

## 2015-02-18 NOTE — Assessment & Plan Note (Signed)
Referred to GI for repeat colonoscopy. She states that last is 7- years ago and 20 polyps. Last we have is from 2002 so she is due.

## 2015-02-18 NOTE — Assessment & Plan Note (Signed)
Continue metoprolol 50 mg BID, amlodipine 10 mg daily, hctz 25 mg daily, losartan 100 mg daily. Will not adjust therapy today, no labs indicated. At goal. Weight is up though and we did discuss how that affects her BP.

## 2015-02-18 NOTE — Progress Notes (Signed)
Pre visit review using our clinic review tool, if applicable. No additional management support is needed unless otherwise documented below in the visit note. 

## 2015-02-19 ENCOUNTER — Encounter: Payer: Self-pay | Admitting: Gastroenterology

## 2015-02-19 DIAGNOSIS — M6281 Muscle weakness (generalized): Secondary | ICD-10-CM | POA: Diagnosis not present

## 2015-02-19 DIAGNOSIS — M25551 Pain in right hip: Secondary | ICD-10-CM | POA: Diagnosis not present

## 2015-02-24 DIAGNOSIS — M6281 Muscle weakness (generalized): Secondary | ICD-10-CM | POA: Diagnosis not present

## 2015-02-24 DIAGNOSIS — M25551 Pain in right hip: Secondary | ICD-10-CM | POA: Diagnosis not present

## 2015-02-25 DIAGNOSIS — M6281 Muscle weakness (generalized): Secondary | ICD-10-CM | POA: Diagnosis not present

## 2015-02-25 DIAGNOSIS — M25551 Pain in right hip: Secondary | ICD-10-CM | POA: Diagnosis not present

## 2015-02-26 DIAGNOSIS — M6281 Muscle weakness (generalized): Secondary | ICD-10-CM | POA: Diagnosis not present

## 2015-02-26 DIAGNOSIS — M25551 Pain in right hip: Secondary | ICD-10-CM | POA: Diagnosis not present

## 2015-03-04 DIAGNOSIS — M6281 Muscle weakness (generalized): Secondary | ICD-10-CM | POA: Diagnosis not present

## 2015-03-04 DIAGNOSIS — M25551 Pain in right hip: Secondary | ICD-10-CM | POA: Diagnosis not present

## 2015-03-05 DIAGNOSIS — M6281 Muscle weakness (generalized): Secondary | ICD-10-CM | POA: Diagnosis not present

## 2015-03-05 DIAGNOSIS — M25551 Pain in right hip: Secondary | ICD-10-CM | POA: Diagnosis not present

## 2015-03-06 DIAGNOSIS — M25551 Pain in right hip: Secondary | ICD-10-CM | POA: Diagnosis not present

## 2015-03-06 DIAGNOSIS — M6281 Muscle weakness (generalized): Secondary | ICD-10-CM | POA: Diagnosis not present

## 2015-03-10 ENCOUNTER — Other Ambulatory Visit: Payer: Self-pay | Admitting: Internal Medicine

## 2015-03-12 DIAGNOSIS — M6281 Muscle weakness (generalized): Secondary | ICD-10-CM | POA: Diagnosis not present

## 2015-03-12 DIAGNOSIS — M25551 Pain in right hip: Secondary | ICD-10-CM | POA: Diagnosis not present

## 2015-03-13 DIAGNOSIS — M6281 Muscle weakness (generalized): Secondary | ICD-10-CM | POA: Diagnosis not present

## 2015-03-13 DIAGNOSIS — M25551 Pain in right hip: Secondary | ICD-10-CM | POA: Diagnosis not present

## 2015-03-14 DIAGNOSIS — M6281 Muscle weakness (generalized): Secondary | ICD-10-CM | POA: Diagnosis not present

## 2015-03-14 DIAGNOSIS — M25551 Pain in right hip: Secondary | ICD-10-CM | POA: Diagnosis not present

## 2015-03-24 DIAGNOSIS — M6281 Muscle weakness (generalized): Secondary | ICD-10-CM | POA: Diagnosis not present

## 2015-03-24 DIAGNOSIS — M25551 Pain in right hip: Secondary | ICD-10-CM | POA: Diagnosis not present

## 2015-03-26 DIAGNOSIS — M6281 Muscle weakness (generalized): Secondary | ICD-10-CM | POA: Diagnosis not present

## 2015-03-26 DIAGNOSIS — M25551 Pain in right hip: Secondary | ICD-10-CM | POA: Diagnosis not present

## 2015-03-27 DIAGNOSIS — M25551 Pain in right hip: Secondary | ICD-10-CM | POA: Diagnosis not present

## 2015-03-27 DIAGNOSIS — M6281 Muscle weakness (generalized): Secondary | ICD-10-CM | POA: Diagnosis not present

## 2015-04-01 DIAGNOSIS — M6281 Muscle weakness (generalized): Secondary | ICD-10-CM | POA: Diagnosis not present

## 2015-04-01 DIAGNOSIS — M25551 Pain in right hip: Secondary | ICD-10-CM | POA: Diagnosis not present

## 2015-04-11 ENCOUNTER — Ambulatory Visit (AMBULATORY_SURGERY_CENTER): Payer: Self-pay

## 2015-04-11 VITALS — Ht 63.0 in | Wt 253.4 lb

## 2015-04-11 DIAGNOSIS — Z860100 Personal history of colon polyps, unspecified: Secondary | ICD-10-CM

## 2015-04-11 MED ORDER — SUPREP BOWEL PREP KIT 17.5-3.13-1.6 GM/177ML PO SOLN: 1.0000 | Freq: Once | ORAL | Status: DC

## 2015-04-11 NOTE — Progress Notes (Signed)
No allergies to eggs or soy except eggs causes vomiting No past problems with anesthesia No diet/weight loss meds No home oxygen  Has email and internet; registered emmi

## 2015-04-25 ENCOUNTER — Encounter: Payer: Self-pay | Admitting: Gastroenterology

## 2015-04-25 ENCOUNTER — Ambulatory Visit (AMBULATORY_SURGERY_CENTER): Payer: Medicare Other | Admitting: Gastroenterology

## 2015-04-25 VITALS — BP 121/82 | HR 62 | Temp 98.1°F | Resp 30 | Ht 63.0 in | Wt 253.0 lb

## 2015-04-25 DIAGNOSIS — D124 Benign neoplasm of descending colon: Secondary | ICD-10-CM

## 2015-04-25 DIAGNOSIS — D123 Benign neoplasm of transverse colon: Secondary | ICD-10-CM

## 2015-04-25 DIAGNOSIS — Z860101 Personal history of adenomatous and serrated colon polyps: Secondary | ICD-10-CM

## 2015-04-25 DIAGNOSIS — Z86010 Personal history of colonic polyps: Secondary | ICD-10-CM | POA: Diagnosis present

## 2015-04-25 DIAGNOSIS — D125 Benign neoplasm of sigmoid colon: Secondary | ICD-10-CM

## 2015-04-25 DIAGNOSIS — K621 Rectal polyp: Secondary | ICD-10-CM | POA: Diagnosis not present

## 2015-04-25 DIAGNOSIS — I10 Essential (primary) hypertension: Secondary | ICD-10-CM | POA: Diagnosis not present

## 2015-04-25 DIAGNOSIS — D128 Benign neoplasm of rectum: Secondary | ICD-10-CM

## 2015-04-25 DIAGNOSIS — D122 Benign neoplasm of ascending colon: Secondary | ICD-10-CM | POA: Diagnosis not present

## 2015-04-25 DIAGNOSIS — K219 Gastro-esophageal reflux disease without esophagitis: Secondary | ICD-10-CM | POA: Diagnosis not present

## 2015-04-25 MED ORDER — SODIUM CHLORIDE 0.9 % IV SOLN: 500.0000 mL | INTRAVENOUS | Status: DC

## 2015-04-25 NOTE — Progress Notes (Signed)
Called to room to assist during endoscopic procedure.  Patient ID and intended procedure confirmed with present staff. Received instructions for my participation in the procedure from the performing physician.  

## 2015-04-25 NOTE — Progress Notes (Signed)
Report to PACU, RN, vss, BBS= Clear.  

## 2015-04-25 NOTE — Op Note (Signed)
[facility]  [address] [place], [id number]   COLONOSCOPY PROCEDURE REPORT  PATIENT: Carolyn Fowler, Carolyn Fowler  MR#: [id number] BIRTHDATE: 1945-11-04 , 78  yrs. old GENDER: female ENDOSCOPIST: Ladene Artist, MD, [facility] REFERRED BY: Pricilla Holm, MD PROCEDURE DATE:  04/25/2015 PROCEDURE:   Colonoscopy, surveillance , Colonoscopy with biopsy, and Colonoscopy with snare polypectomy First Screening Colonoscopy - Avg.  risk and is 50 yrs.  old or older - No.  Prior Negative Screening - Now for repeat screening. N/A  History of Adenoma - Now for follow-up colonoscopy & has been > or = to 3 yrs.  Yes hx of adenoma.  Has been 3 or more years since last colonoscopy.  Polyps removed today? Yes ASA CLASS:   Class II INDICATIONS:Surveillance due to prior colonic neoplasia and Advanced Neoplasm (= 10 mm, high grade dysplasia, villous component. MEDICATIONS: Monitored anesthesia care and Propofol 300 mg IV  DESCRIPTION OF PROCEDURE:   After the risks benefits and alternatives of the procedure were thoroughly explained, informed consent was obtained.  The digital rectal exam revealed no abnormalities of the rectum.   The LB [id number]  endoscope was introduced through the anus and advanced to the cecum, which was identified by both the appendix and ileocecal valve. No adverse events experienced.   The quality of the prep was good.  (Suprep was used)  The instrument was then slowly withdrawn as the colon was fully examined. Estimated blood loss is zero unless otherwise noted in this procedure report.    COLON FINDINGS: Six sessile polyps measuring 6-7 mm in size were found in the sigmoid colon, descending colon, and transverse colon. Polypectomies were performed with a cold snare.  The resection was complete, the polyp tissue was completely retrieved and sent to histology.  Two sessile polyps measuring 5 mm in size were found in the rectum and ascending colon.   Polypectomies were performed with cold forceps.  The resection was complete, the polyp tissue was completely retrieved and sent to histology.   There was mild diverticulosis noted in the sigmoid colon.   The examination was otherwise normal.  Retroflexed views revealed no abnormalities. The time to cecum = 2.2 Withdrawal time = 15.8   The scope was withdrawn and the procedure completed.  COMPLICATIONS: There were no immediate complications.   ENDOSCOPIC IMPRESSION: 1.   Six sessile polyps in the sigmoid, descending, and transverse colon; polypectomies performed with a cold snare 2.   Two sessile polyps in the rectum and ascending colon; polypectomies performed with cold forceps 3.   Mild diverticulosis in the sigmoid colon  RECOMMENDATIONS: 1.  Await pathology results 2.  High fiber diet with liberal fluid intake. 3.  Repeat colonoscopy in 3-5 years pending pathology review  eSigned:  Ladene Artist, MD, [facility] 04/25/2015 10:42 AM     PATIENT NAME:  [place], [place] MR#: [id number]

## 2015-04-25 NOTE — Patient Instructions (Signed)
YOU HAD AN ENDOSCOPIC PROCEDURE TODAY AT [facility]:   Refer to the procedure report that was given to you for any specific questions about what was found during the examination.  If the procedure report does not answer your questions, please call your gastroenterologist to clarify.  If you requested that your care partner not be given the details of your procedure findings, then the procedure report has been included in a sealed envelope for you to review at your convenience later.  YOU SHOULD EXPECT: Some feelings of bloating in the abdomen. Passage of more gas than usual.  Walking can help get rid of the air that was put into your GI tract during the procedure and reduce the bloating. If you had a lower endoscopy (such as a colonoscopy or flexible sigmoidoscopy) you may notice spotting of blood in your stool or on the toilet paper. If you underwent a bowel prep for your procedure, you may not have a normal bowel movement for a few days.  Please Note:  You might notice some irritation and congestion in your nose or some drainage.  This is from the oxygen used during your procedure.  There is no need for concern and it should clear up in a day or so.  SYMPTOMS TO REPORT IMMEDIATELY:   Following lower endoscopy (colonoscopy or flexible sigmoidoscopy):  Excessive amounts of blood in the stool  Significant tenderness or worsening of abdominal pains  Swelling of the abdomen that is new, acute  Fever of 100F or higher   For urgent or emergent issues, a gastroenterologist can be reached at any hour by calling [phone].   DIET: Your first meal following the procedure should be a small meal and then it is ok to progress to your normal diet. Heavy or fried foods are harder to digest and may make you feel nauseous or bloated.  Likewise, meals heavy in dairy and vegetables can increase bloating.  Drink plenty of fluids but you should avoid alcoholic beverages for 24  hours.  ACTIVITY:  You should plan to take it easy for the rest of today and you should NOT DRIVE or use heavy machinery until tomorrow (because of the sedation medicines used during the test).    FOLLOW UP: Our staff will call the number listed on your records the next business day following your procedure to check on you and address any questions or concerns that you may have regarding the information given to you following your procedure. If we do not reach you, we will leave a message.  However, if you are feeling well and you are not experiencing any problems, there is no need to return our call.  We will assume that you have returned to your regular daily activities without incident.  If any biopsies were taken you will be contacted by phone or by letter within the next 1-3 weeks.  Please call us at [phone] if you have not heard about the biopsies in 3 weeks.    SIGNATURES/CONFIDENTIALITY: You and/or your care partner have signed paperwork which will be entered into your electronic medical record.  These signatures attest to the fact that that the information above on your After Visit Summary has been reviewed and is understood.  Full responsibility of the confidentiality of this discharge information lies with you and/or your care-partner.  Read all handouts given to you by your recovery room nurse.  Thank-you for choosing [place] for your healthcare needs today.

## 2015-04-28 ENCOUNTER — Telehealth: Payer: Self-pay | Admitting: *Deleted

## 2015-04-28 NOTE — Telephone Encounter (Signed)
  Follow up Call-  Call back number 04/25/2015  Post procedure Call Back phone  # [phone]  Permission to leave phone message Yes     No answer at # given.  Left message on VM.

## 2015-04-30 ENCOUNTER — Encounter: Payer: Self-pay | Admitting: Gastroenterology

## 2015-05-22 ENCOUNTER — Other Ambulatory Visit: Payer: Self-pay | Admitting: Internal Medicine

## 2015-06-09 ENCOUNTER — Other Ambulatory Visit: Payer: Self-pay | Admitting: Internal Medicine

## 2015-06-20 ENCOUNTER — Other Ambulatory Visit: Payer: Self-pay | Admitting: Internal Medicine

## 2015-07-11 DIAGNOSIS — M961 Postlaminectomy syndrome, not elsewhere classified: Secondary | ICD-10-CM | POA: Diagnosis not present

## 2015-07-11 DIAGNOSIS — M419 Scoliosis, unspecified: Secondary | ICD-10-CM | POA: Diagnosis not present

## 2015-07-11 DIAGNOSIS — M4316 Spondylolisthesis, lumbar region: Secondary | ICD-10-CM | POA: Diagnosis not present

## 2015-07-11 DIAGNOSIS — M4806 Spinal stenosis, lumbar region: Secondary | ICD-10-CM | POA: Diagnosis not present

## 2015-08-12 ENCOUNTER — Other Ambulatory Visit: Payer: Self-pay | Admitting: Internal Medicine

## 2015-09-11 ENCOUNTER — Other Ambulatory Visit: Payer: Self-pay | Admitting: Internal Medicine

## 2015-11-22 ENCOUNTER — Other Ambulatory Visit: Payer: Self-pay | Admitting: Internal Medicine

## 2015-12-10 ENCOUNTER — Other Ambulatory Visit: Payer: Self-pay | Admitting: Internal Medicine

## 2016-01-26 DIAGNOSIS — H524 Presbyopia: Secondary | ICD-10-CM | POA: Diagnosis not present

## 2016-01-26 DIAGNOSIS — H5213 Myopia, bilateral: Secondary | ICD-10-CM | POA: Diagnosis not present

## 2016-01-26 DIAGNOSIS — H2513 Age-related nuclear cataract, bilateral: Secondary | ICD-10-CM | POA: Diagnosis not present

## 2016-01-26 DIAGNOSIS — H11002 Unspecified pterygium of left eye: Secondary | ICD-10-CM | POA: Diagnosis not present

## 2016-03-16 ENCOUNTER — Other Ambulatory Visit: Payer: Self-pay | Admitting: Internal Medicine

## 2016-04-14 ENCOUNTER — Other Ambulatory Visit: Payer: Self-pay | Admitting: Internal Medicine

## 2016-04-19 ENCOUNTER — Other Ambulatory Visit: Payer: Self-pay | Admitting: Internal Medicine

## 2016-04-22 ENCOUNTER — Ambulatory Visit (INDEPENDENT_AMBULATORY_CARE_PROVIDER_SITE_OTHER): Payer: Medicare Other | Admitting: Internal Medicine

## 2016-04-22 ENCOUNTER — Other Ambulatory Visit (INDEPENDENT_AMBULATORY_CARE_PROVIDER_SITE_OTHER): Payer: Medicare Other

## 2016-04-22 ENCOUNTER — Encounter: Payer: Self-pay | Admitting: Internal Medicine

## 2016-04-22 VITALS — BP 130/80 | HR 81 | Temp 98.7°F | Ht 63.0 in | Wt 264.0 lb

## 2016-04-22 DIAGNOSIS — I10 Essential (primary) hypertension: Secondary | ICD-10-CM

## 2016-04-22 DIAGNOSIS — K219 Gastro-esophageal reflux disease without esophagitis: Secondary | ICD-10-CM | POA: Diagnosis not present

## 2016-04-22 LAB — COMPREHENSIVE METABOLIC PANEL
ALK PHOS: 86 U/L (ref 39–117)
ALT: 15 U/L (ref 0–35)
AST: 13 U/L (ref 0–37)
Albumin: 4.2 g/dL (ref 3.5–5.2)
BUN: 13 mg/dL (ref 6–23)
CHLORIDE: 101 meq/L (ref 96–112)
CO2: 34 mEq/L — ABNORMAL HIGH (ref 19–32)
CREATININE: 0.63 mg/dL (ref 0.40–1.20)
Calcium: 9.7 mg/dL (ref 8.4–10.5)
GFR: 120.28 mL/min (ref >60.00)
Glucose, Bld: 94 mg/dL (ref 70–99)
Potassium: 3.7 mEq/L (ref 3.5–5.1)
SODIUM: 139 meq/L (ref 135–145)
Total Bilirubin: 0.7 mg/dL (ref 0.2–1.2)
Total Protein: 7.2 g/dL (ref 6.0–8.3)

## 2016-04-22 LAB — LIPID PANEL
Cholesterol: 191 mg/dL (ref 0–200)
HDL: 56.1 mg/dL (ref >39.00)
LDL CALC: 119 mg/dL — AB (ref 0–99)
NonHDL: 134.93
TRIGLYCERIDES: 81 mg/dL (ref 0.0–149.0)
Total CHOL/HDL Ratio: 3
VLDL: 16.2 mg/dL (ref 0.0–40.0)

## 2016-04-22 LAB — HEMOGLOBIN A1C: Hgb A1c MFr Bld: 6 % (ref 4.6–6.5)

## 2016-04-22 MED ORDER — FLUTICASONE PROPIONATE 50 MCG/ACT NA SUSP: 2.0000 | Freq: Every day | NASAL | 6 refills | Status: DC

## 2016-04-22 MED ORDER — HYDROCHLOROTHIAZIDE 25 MG PO TABS: 25.0000 mg | ORAL_TABLET | Freq: Every day | ORAL | 0 refills | Status: DC

## 2016-04-22 MED ORDER — METOPROLOL TARTRATE 50 MG PO TABS: 50.0000 mg | ORAL_TABLET | Freq: Two times a day (BID) | ORAL | 3 refills | Status: DC

## 2016-04-22 MED ORDER — FAMOTIDINE 20 MG PO TABS: 20.0000 mg | ORAL_TABLET | Freq: Two times a day (BID) | ORAL | 3 refills | Status: DC

## 2016-04-22 MED ORDER — AMLODIPINE BESYLATE 10 MG PO TABS: 10.0000 mg | ORAL_TABLET | Freq: Every day | ORAL | 3 refills | Status: DC

## 2016-04-22 NOTE — Progress Notes (Signed)
Pre visit review using our clinic review tool, if applicable. No additional management support is needed unless otherwise documented below in the visit note. 

## 2016-04-22 NOTE — Assessment & Plan Note (Signed)
We will stop losartan as she is only taking 1-2 times per week. Continue hctz 25 mg, amlodipine 10 mg daily, and metoprolol 50 mg BID. Checking CMP and adjust as needed.

## 2016-04-22 NOTE — Progress Notes (Signed)
   Subjective:    Patient ID: Carolyn Fowler, female    DOB: 09-15-1945, 71 y.o.   MRN: [id number]  HPI The patient is a 71 YO female coming in for follow up due to getting cut off from refills. She needs follow up of her blood pressure (out of her hctz but taking amlodipine and metoprolol, taking losartan 1-2 times per week if that, no chest pains or headaches, does not check at home her pressure) and she wants to go to the gym for weight loss but needs a note from a doctor that she is able to exercise (she is morbidly obese, has been trying to lose weight on her own unsuccessfully, complicated by hypertension and GERD and mild arthritis), as well as her gerd (taking pepcid BID with fairly good relief, sometimes still needs to take a tums in addition).   PMH, [facility], social history reviewed and updated.   Review of Systems  Constitutional: Negative.   HENT: Negative.   Eyes: Negative.   Respiratory: Negative for cough, chest tightness and shortness of breath.   Cardiovascular: Negative for chest pain, palpitations and leg swelling.  Gastrointestinal: Negative for abdominal distention, abdominal pain, constipation, diarrhea, nausea and vomiting.       Some GERD  Musculoskeletal: Positive for arthralgias. Negative for back pain, gait problem, joint swelling, myalgias and neck pain.  Skin: Negative.   Neurological: Negative.   Psychiatric/Behavioral: Negative.       Objective:   Physical Exam  Constitutional: She is oriented to person, place, and time. She appears well-developed and well-nourished.  Overweight  HENT:  Head: Normocephalic and atraumatic.  Eyes: EOM are normal.  Neck: Normal range of motion.  Cardiovascular: Normal rate and regular rhythm.   Pulmonary/Chest: Effort normal and breath sounds normal. No respiratory distress. She has no wheezes. She has no rales.  Abdominal: Soft. Bowel sounds are normal. She exhibits no distension. There is no tenderness. There is no  rebound.  Musculoskeletal: She exhibits no edema.  Neurological: She is alert and oriented to person, place, and time. Coordination normal.  Skin: Skin is warm and dry.  Psychiatric: She has a normal mood and affect.   Vitals:   04/22/16 1443  BP: 130/80  Pulse: 81  Temp: 98.7 F (37.1 C)  TempSrc: Oral  SpO2: 97%  Weight: 264 lb (119.7 kg)  Height: 5\' 3"  (1.6 m)      Assessment & Plan:

## 2016-04-22 NOTE — Patient Instructions (Signed)
Stop taking the losartan as you may not need it anymore.   We have given you the note for the gym.

## 2016-04-23 ENCOUNTER — Encounter: Payer: Self-pay | Admitting: Internal Medicine

## 2016-04-23 DIAGNOSIS — K219 Gastro-esophageal reflux disease without esophagitis: Secondary | ICD-10-CM | POA: Insufficient documentation

## 2016-04-23 NOTE — Assessment & Plan Note (Signed)
Refill pepcid and continue. She is able to do well without PPI which is great. Okay to continue tums as needed.

## 2016-04-23 NOTE — Assessment & Plan Note (Signed)
Complicated by GERD, hypertension, mild arthritis. Note given today so that she can go to the gym and work out.

## 2016-05-10 ENCOUNTER — Ambulatory Visit: Payer: Medicare Other | Admitting: Internal Medicine

## 2016-05-24 ENCOUNTER — Other Ambulatory Visit: Payer: Self-pay | Admitting: Internal Medicine

## 2016-07-23 NOTE — Progress Notes (Signed)
Pre visit review using our clinic review tool, if applicable. No additional management support is needed unless otherwise documented below in the visit note. 

## 2016-07-23 NOTE — Progress Notes (Signed)
Subjective:   Carolyn Fowler is a 70 y.o. female who presents for Medicare Annual (Subsequent) preventive examination.  Review of Systems:  No ROS.  Medicare Wellness Visit.  Cardiac Risk Factors include: hypertension;advanced age (>43men, >9 women);obesity (BMI >30kg/m2) Sleep patterns: no sleep issues, feels rested on waking, gets up 1-2 times nightly to void and sleeps 7-8 hours nightly.   Home Safety/Smoke Alarms:   Living environment; residence and Firearm Safety: 2-story house, equipment: Radio producer, Type: [place], no firearms. Lives with husband, no needs for DME, has good family and church support Seat Belt Safety/Bike Helmet: Wears seat belt.   Counseling:   Eye Exam- appointment yearly Dental- appointment every 6 months  Female:   Pap- N/A     Mammo- Last 07/16/14, BI-RADS CATEGORY  1: Negative      Dexa scan- Last 07/29/14, low bone mass      CCS- Last  04/25/15, precancerous polyp, recall 3 years     Objective:     Vitals: BP 138/78   Pulse 65   Resp 20   Ht 5\' 3"  (1.6 m)   Wt 259 lb (117.5 kg)   SpO2 98%   BMI 45.88 kg/m   Body mass index is 45.88 kg/m.   Tobacco History  Smoking Status  . Never Smoker  Smokeless Tobacco  . Never Used     Counseling given: Not Answered   Past Medical History:  Diagnosis Date  . Anemia   . Arthritis   . Dysrhythmia    occ palpitations- no cardiologist  . GERD (gastroesophageal reflux disease)   . History of keloid of skin   . Hypertension   . Neuromuscular disorder ([place])    right leg tumbness and tingling from herniated disc   Past Surgical History:  Procedure Laterality Date  . ABDOMINAL HYSTERECTOMY    . APPENDECTOMY    . BACK SURGERY  Aug 2013  . BACK SURGERY  Aug 2016   L3 4 5 S1  . COLONOSCOPY W/ POLYPECTOMY    . KELOID EXCISION     on ear  . KNEE ARTHROSCOPY     left  . KNEE SURGERY    . SHOULDER OPEN ROTATOR CUFF REPAIR     left  . TUBAL LIGATION     Family History  Problem  Relation Age of Onset  . Adopted: Yes  . Arthritis Mother   . Heart disease Father   . Colon cancer Neg Hx   . Esophageal cancer Neg Hx   . Rectal cancer Neg Hx   . Stomach cancer Neg Hx    History  Sexual Activity  . Sexual activity: No    Outpatient Encounter Prescriptions as of 07/26/2016  Medication Sig  . amLODipine (NORVASC) 10 MG tablet Take 1 tablet (10 mg total) by mouth daily.  Marland Kitchen aspirin 81 MG tablet Take 81 mg by mouth daily.  . famotidine (PEPCID) 20 MG tablet Take 1 tablet (20 mg total) by mouth 2 (two) times daily.  . fluticasone (FLONASE) 50 MCG/ACT nasal spray Place 2 sprays into both nostrils daily.  . hydrochlorothiazide (HYDRODIURIL) 25 MG tablet TAKE 1 TABLET BY MOUTH DAILY  . metoprolol (LOPRESSOR) 50 MG tablet Take 1 tablet (50 mg total) by mouth 2 (two) times daily.  . Multiple Vitamin (MULTIVITAMIN WITH MINERALS) TABS Take 1 tablet by mouth daily.  . Sennosides (SENOKOT PO) Take by mouth.  . [DISCONTINUED] losartan (COZAAR) 100 MG tablet TAKE ONE TABLET BY MOUTH DAILY (  Patient not taking: Reported on 07/26/2016)   No facility-administered encounter medications on file as of 07/26/2016.     Activities of Daily Living In your present state of health, do you have any difficulty performing the following activities: 07/26/2016  Hearing? N  Vision? N  Difficulty concentrating or making decisions? N  Walking or climbing stairs? N  Dressing or bathing? N  Doing errands, shopping? N  Preparing Food and eating ? N  Using the Toilet? N  In the past six months, have you accidently leaked urine? Y  Do you have problems with loss of bowel control? N  Managing your Medications? N  Managing your Finances? N  Housekeeping or managing your Housekeeping? N  Some recent data might be hidden    Patient Care Team: Hoyt Koch, MD as PCP - General (Internal Medicine)    Assessment:    Physical assessment deferred to PCP.  Exercise Activities and Dietary  recommendations Current Exercise Habits: Home exercise routine (chair exercise pamphlets provided), Type of exercise: stretching;calisthenics (stationary bike), Time (Minutes): 30, Frequency (Times/Week): 3, Weekly Exercise (Minutes/Week): 90, Intensity: Mild, Exercise limited by: orthopedic condition(s)  Diet (meal preparation, eat out, water intake, caffeinated beverages, dairy products, fruits and vegetables): in general, a "healthy" diet  , well balanced, low fat/ cholesterol, low salt eats a variety of fruits and vegetables daily, limits salt, fat/cholesterol, sugar, caffeine, drinks 6-8 glasses of water daily.     Goals    . weight loss          Lost weight in 5 pound increments, continue to exercise, and eat healthy.       Fall Risk Fall Risk  07/26/2016 02/18/2015  Falls in the past year? Yes No  Number falls in past yr: 1 -  Injury with Fall? No -  Risk for fall due to : Impaired mobility;Impaired balance/gait -  Follow up Falls prevention discussed -   Depression Screen PHQ 2/9 Scores 07/26/2016 02/18/2015  PHQ - 2 Score 0 0     Cognitive Function       Ad8 score reviewed for issues:  Issues making decisions: no  Less interest in hobbies / activities: no  Repeats questions, stories (family complaining): no  Trouble using ordinary gadgets (microwave, computer, phone): no  Forgets the month or year: no  Mismanaging finances: no  Remembering appts: no  Daily problems with thinking and/or memory: no Ad8 score is= 0  Immunization History  Administered Date(s) Administered  . Pneumococcal Conjugate-13 02/18/2015  . Pneumococcal Polysaccharide-23 07/01/2011  . Td 09/23/2009   Screening Tests Health Maintenance  Topic Date Due  . PNA vac Low Risk Adult (2 of 2 - PPSV23) 06/30/2016  . MAMMOGRAM  07/24/2017 (Originally 07/15/2016)  . Hepatitis C Screening  07/26/2017 (Originally 1945/10/15)  . INFLUENZA VACCINE  10/06/2016  . COLONOSCOPY  04/24/2018  .  TETANUS/TDAP  09/24/2019  . DEXA SCAN  Completed      Plan:    Continue to eat heart healthy diet (full of fruits, vegetables, whole grains, lean protein, water--limit salt, fat, and sugar intake) and increase physical activity as tolerated.  Continue doing brain stimulating activities (puzzles, reading, adult coloring books, staying active) to keep memory sharp.  I have personally reviewed and noted the following in the patient's chart:   . Medical and social history . Use of alcohol, tobacco or illicit drugs  . Current medications and supplements . Functional ability and status . Nutritional status . Physical activity .  Advanced directives . List of other physicians . Vitals . Screenings to include cognitive, depression, and falls . Referrals and appointments  In addition, I have reviewed and discussed with patient certain preventive protocols, quality metrics, and best practice recommendations. A written personalized care plan for preventive services as well as general preventive health recommendations were provided to patient.     Michiel Cowboy, RN  07/26/2016

## 2016-07-26 ENCOUNTER — Ambulatory Visit (INDEPENDENT_AMBULATORY_CARE_PROVIDER_SITE_OTHER): Payer: Medicare Other | Admitting: *Deleted

## 2016-07-26 VITALS — BP 138/78 | HR 65 | Resp 20 | Ht 63.0 in | Wt 259.0 lb

## 2016-07-26 DIAGNOSIS — Z0000 Encounter for general adult medical examination without abnormal findings: Secondary | ICD-10-CM

## 2016-07-26 NOTE — Progress Notes (Signed)
Medical screening examination/treatment/procedure(s) were performed by non-physician practitioner and as supervising physician I was immediately available for consultation/collaboration. I agree with above.  A , MD 

## 2016-07-26 NOTE — Patient Instructions (Addendum)
Continue to eat heart healthy diet (full of fruits, vegetables, whole grains, lean protein, water--limit salt, fat, and sugar intake) and increase physical activity as tolerated.  Continue doing brain stimulating activities (puzzles, reading, adult coloring books, staying active) to keep memory sharp.   Carolyn Fowler , Thank you for taking time to come for your Medicare Wellness Visit. I appreciate your ongoing commitment to your health goals. Please review the following plan we discussed and let me know if I can assist you in the future.   These are the goals we discussed: Goals    . weight loss          Lost weight in 5 pound increments, continue to exercise, and eat healthy.        This is a list of the screening recommended for you and due dates:  Health Maintenance  Topic Date Due  . Pneumonia vaccines (2 of 2 - PPSV23) 06/30/2016  . Mammogram  07/24/2017*  .  Hepatitis C: One time screening is recommended by Center for Disease Control  (CDC) for  adults born from [birth year] through 1964.   07/26/2017*  . Flu Shot  10/06/2016  . Colon Cancer Screening  04/24/2018  . Tetanus Vaccine  09/24/2019  . DEXA scan (bone density measurement)  Completed  *Topic was postponed. The date shown is not the original due date.    Reading Food Labels Foods that are packaged or in containers have a Nutrition Facts panel on the side or back. This is commonly called the food label. The food label helps you make healthy food choices by providing information about serving size and the amount of calories and various nutrients in the food. You can check the food label to find out if the food contains high or low amounts of nutrients that you want to limit in your diet. You can also use the food label to see if the food is a good source of the nutrients that you want to make sure are included in your diet. How do I read the food label?  Begin by checking the serving size and number of servings in the  container. All of the nutrition information listed on the food label is based on one serving. If you eat more than one serving, you must multiply the amounts (such as calories, grams of saturated fat, or milligrams of sodium) by the number of servings.  Check the calories. Choosing foods that are low in calories can help you manage your weight.  Look at the numbers in the % Daily Value column for each listed nutrient. This gives you an idea of how much of the daily recommended amount for that nutrient is provided in one serving of the food. A daily value of 5% or less is considered low. A daily value of 20% or more is considered high.  Check the amounts for the items you should limit in your diet. These include:  Total fat.  Saturated fat.  Trans fat.  Cholesterol.  Sodium.  Check the amounts for the items you should make sure you get enough of. These include:  Dietary fiber.  Vitamins A and C.  Calcium.  Iron. What information is provided on the food label? Serving information   Serving size.  The serving size is listed in cups or pieces. The nutrient amounts listed on the food label apply to this amount of the food.  Servings per container or package.  This shows the number of servings you can  expect to get from the container or package if you follow the suggested serving size. Amount per serving   Calories.  The number of calories in one serving of the food. This information is helpful in managing weight. Low-calorie foods contain 40 calories or less. High-calorie foods contain 400 or more calories.  Calories from fat.  The number of calories that come from fat in one serving. Percent daily value  Percent daily value (shown on the label as % Daily Value) tells you what percent of the daily value for each nutrient one serving provides. The daily value is the recommended amount of the nutrient that you should get each day. For example, if 15% is listed next to dietary  fiber, it means that one serving of the food will give you 15% of the recommended amount of fiber that you should get in a day. The daily values are based on a 2,000-calorie-per-day diet. You may get more or less than 2,000 calories in your diet each day, but the % Daily Value gives you an idea of whether the food contains a high or low amount of the listed nutrient. A daily value of 5% or less is low. A daily value of 20% or more is high. Total fat  Total fat shows you the total amount of fat in one serving (listed in grams). Foods with high amounts of fat usually have higher calories and may lead to weight gain. Two of the fats that make up a portion of the total fat are included on the label:  Saturated fat.  This number is the amount of saturated fat in one serving (listed in grams). Saturated fat increases the amount of blood cholesterol and should be limited to less than 7% of total calories each day. This means that if you eat 2,000 calories each day, you should eat less than 140 calories from saturated fat.   Cholesterol  The amount of cholesterol in one serving is listed in milligrams. Cholesterol should be limited to no more than 300 mg each day. Sodium  The amount of sodium in one serving is listed in milligrams. Most people should limit their sodium intake to 2,300 mg a day. Total carbohydrate  This number shows the amount of total carbohydrate in one serving (listed in grams). This information can help people with diabetes manage the amount of carbohydrate they eat. Two of the carbohydrates that make up a portion of the total carbohydrate are included on the label:  Dietary fiber.  The amount of dietary fiber in one serving is listed in grams. Most people should eat at least 25 g of dietary fiber each day.  Sugars.  The amount of sugar in one serving is also listed in grams. This value includes both naturally occurring sugars from fruit and milk and added sugars such as honey or  table sugar. Protein  The amount of protein in one serving is listed in grams. What other important labeling is on the food package? Ingredients  Food labels will list each ingredient in the food. The first ingredient listed is the ingredient that the food has the most of. The ingredients are listed in the order of their amount by weight from highest to lowest. Food allergen labeling  Food labels may also include a food allergen warning. Listed here are ingredients that can cause allergic reactions in some people. The potential allergens are listed behind the word "Contains" or "May contain." Examples of ingredients that may be listed are wheat,  dairy, eggs, soy, and nuts. If a person knows that he or she is allergic to one of these ingredients, he or she will know to avoid that food. Where to find more information:  U.S. Food and Drug Administration: [employer] This information is not intended to replace advice given to you by your health care provider. Make sure you discuss any questions you have with your health care provider. Document Released: 02/22/2005 Document Revised: 10/22/2015 Document Reviewed: 01/15/2013 Elsevier Interactive Patient Education  2017 [employer].

## 2016-08-13 ENCOUNTER — Encounter: Payer: Self-pay | Admitting: Internal Medicine

## 2016-08-21 IMAGING — CR DG CHEST 2V
2 series · 2 of 2 positions shown · non-contrast
Comparison: October 15, 2014.

CLINICAL DATA: Community acquired pneumonia, cough.

EXAM:
CHEST  2 VIEW

[view not recorded (1 of 2)]
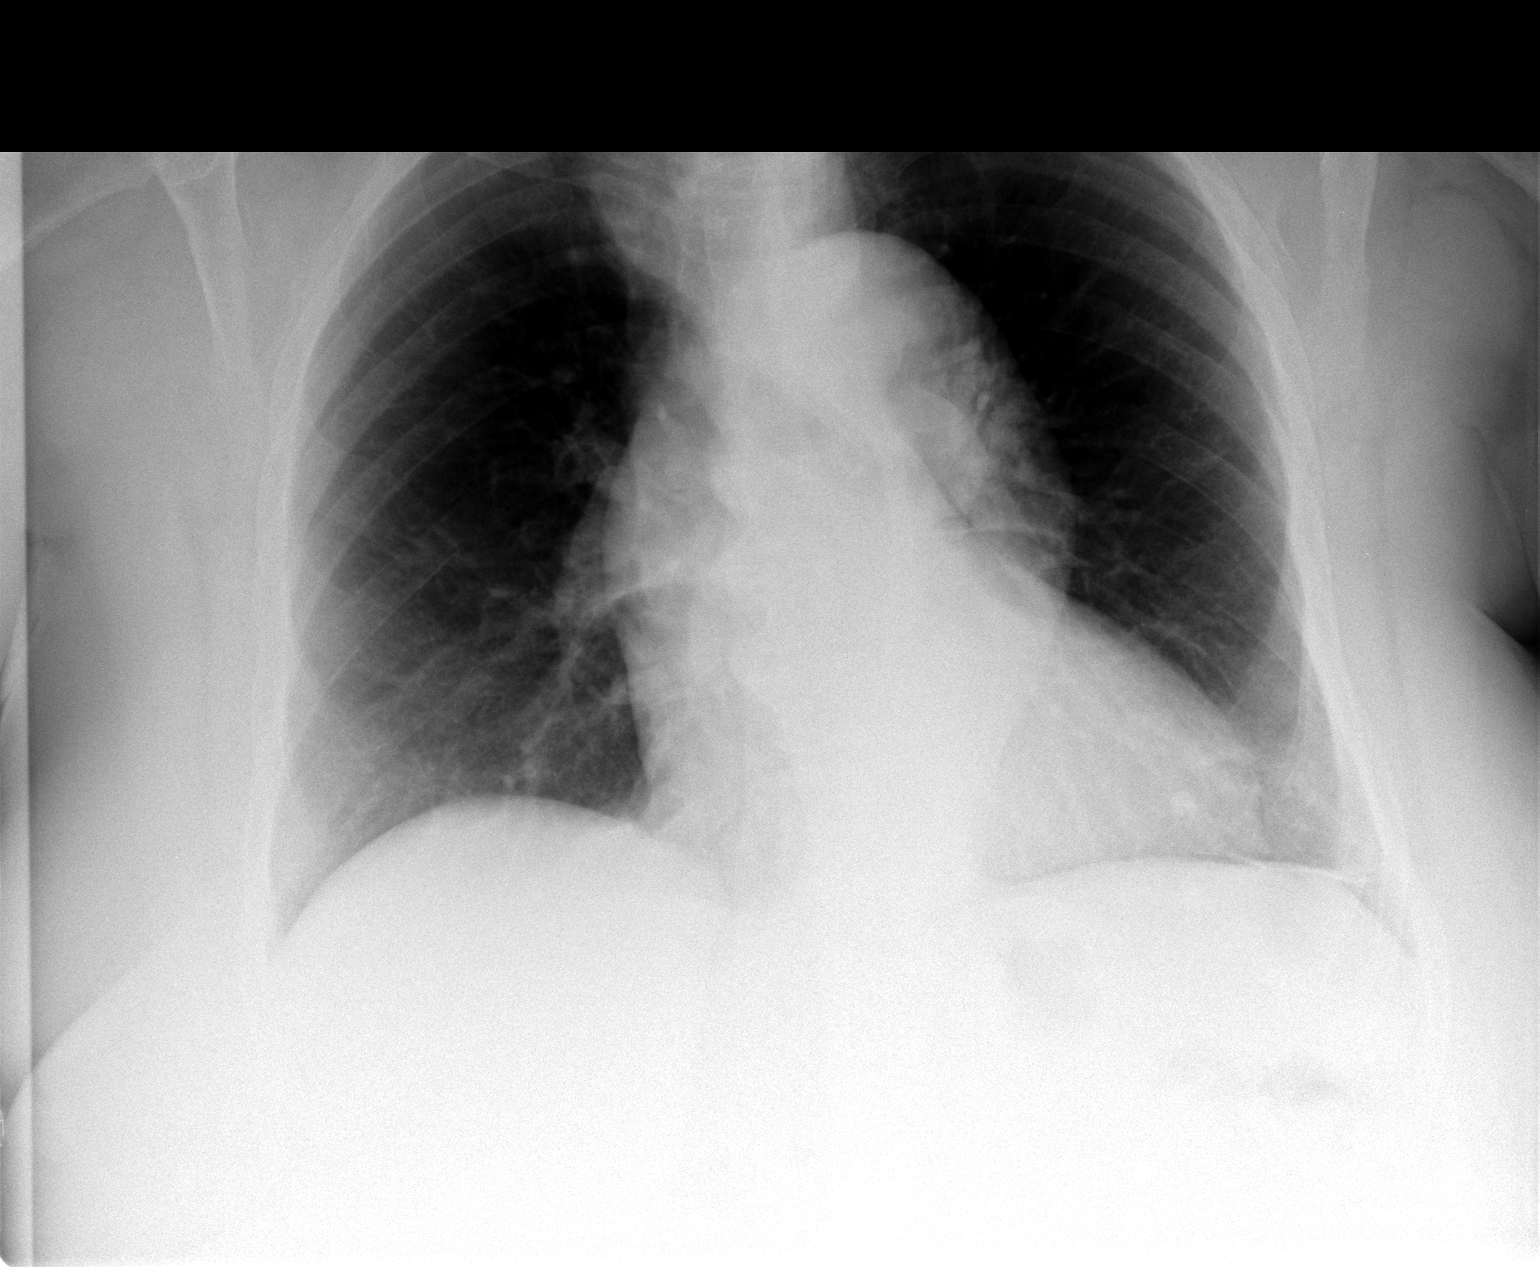

[view not recorded (2 of 2)]
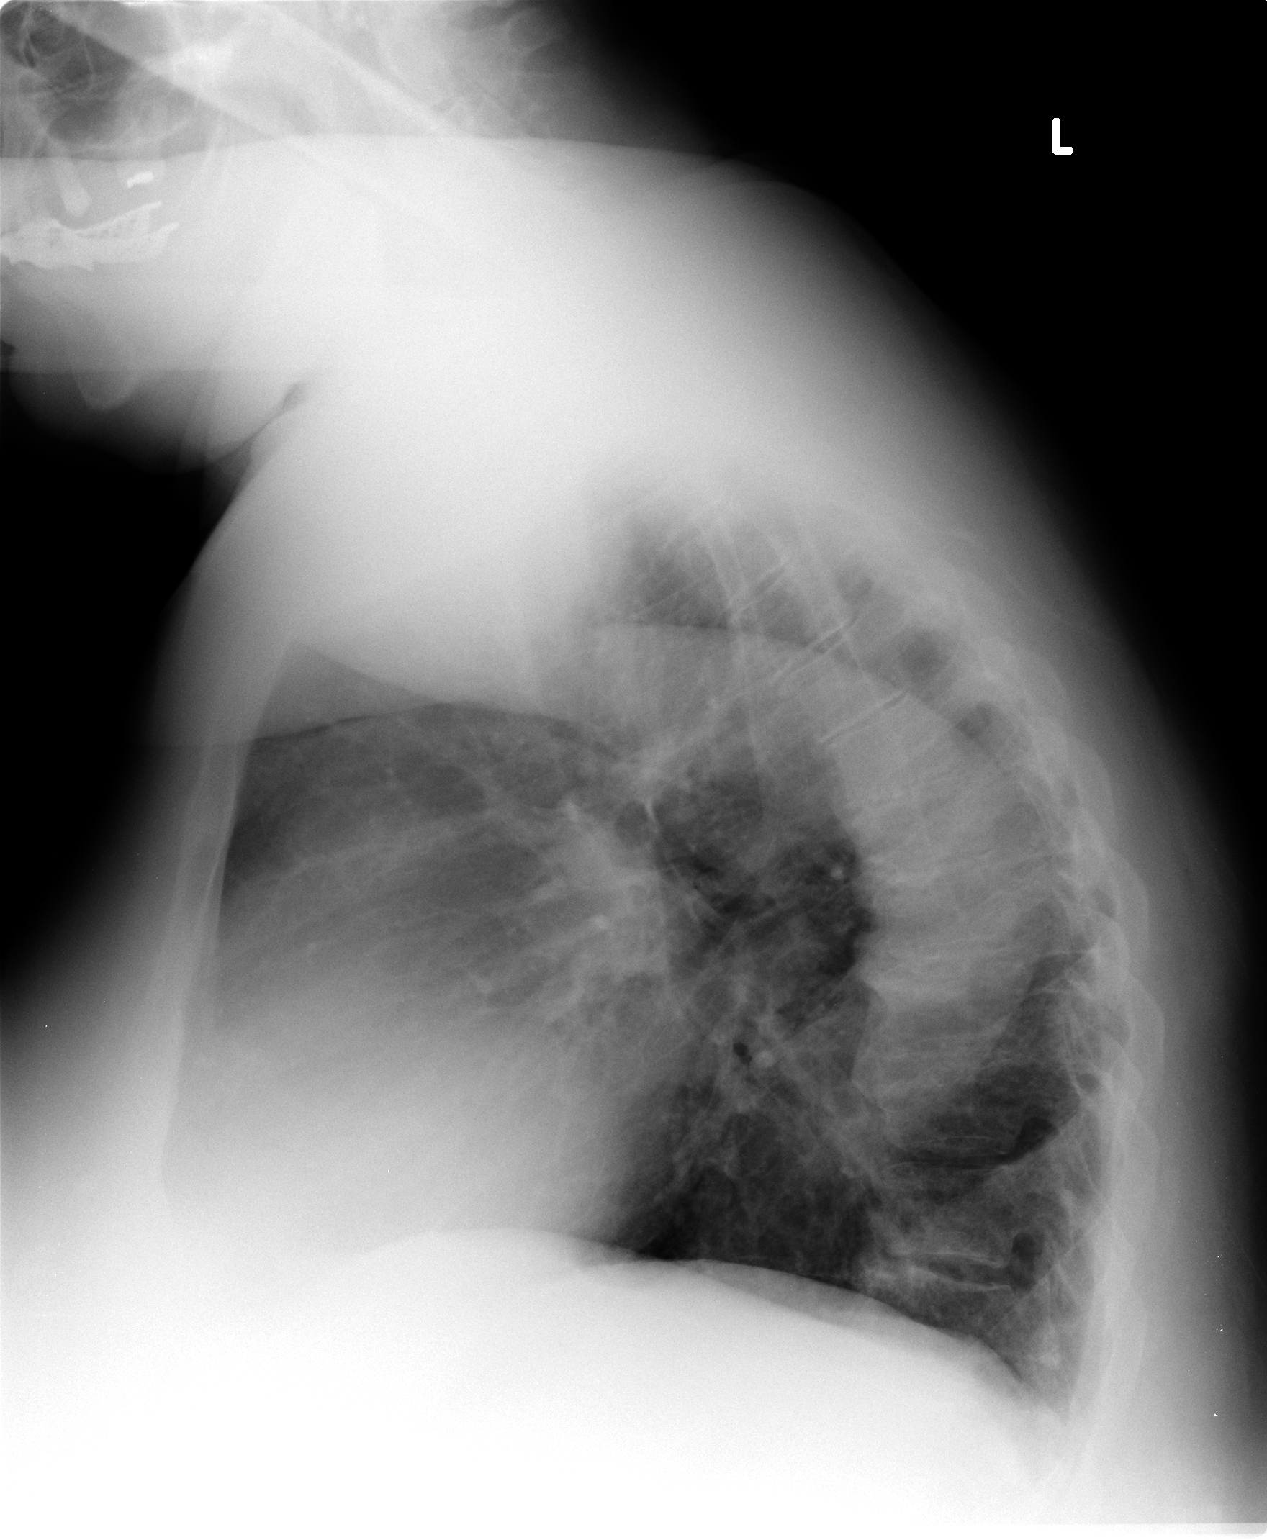

[2 of 2 positions shown; findings below may reference images not displayed]

FINDINGS: No pneumothorax or pleural effusion is noted. Stable enlargement and
tortuosity of descending thoracic aorta is noted. Both lungs are
clear. Multilevel degenerative disc disease is noted in the mid
thoracic spine.
IMPRESSION: Left lower lobe pneumonia noted on prior exam has resolved. No acute
pulmonary disease is seen at this time. Stable enlargement
tortuosity of descending thoracic aorta is noted concerning for
possible thoracic aortic aneurysm.

## 2016-10-06 ENCOUNTER — Ambulatory Visit: Payer: Medicare Other | Admitting: Internal Medicine

## 2016-10-20 ENCOUNTER — Ambulatory Visit: Payer: Medicare Other | Admitting: Internal Medicine

## 2016-11-17 ENCOUNTER — Other Ambulatory Visit: Payer: Self-pay | Admitting: Internal Medicine

## 2016-11-22 ENCOUNTER — Ambulatory Visit (INDEPENDENT_AMBULATORY_CARE_PROVIDER_SITE_OTHER): Payer: Medicare Other | Admitting: Internal Medicine

## 2016-11-22 ENCOUNTER — Encounter: Payer: Self-pay | Admitting: Internal Medicine

## 2016-11-22 DIAGNOSIS — K219 Gastro-esophageal reflux disease without esophagitis: Secondary | ICD-10-CM

## 2016-11-22 DIAGNOSIS — Z0000 Encounter for general adult medical examination without abnormal findings: Secondary | ICD-10-CM | POA: Diagnosis not present

## 2016-11-22 DIAGNOSIS — I10 Essential (primary) hypertension: Secondary | ICD-10-CM | POA: Diagnosis not present

## 2016-11-22 NOTE — Progress Notes (Signed)
   Subjective:    Patient ID: Carolyn Fowler, female    DOB: March 02, 1946, 71 y.o.   MRN: [id number]  HPI The patient is a 71 YO female coming in for physical. No new concerns. Wants to go to the gym and needs a letter of medical clearance.   PMH, [facility], social history reviewed and updated.   Review of Systems  Constitutional: Negative.   HENT: Negative.   Eyes: Negative.   Respiratory: Negative for cough, chest tightness and shortness of breath.   Cardiovascular: Negative for chest pain, palpitations and leg swelling.  Gastrointestinal: Negative for abdominal distention, abdominal pain, constipation, diarrhea, nausea and vomiting.  Genitourinary: Positive for enuresis.  Musculoskeletal: Negative.   Skin: Negative.   Neurological: Negative.   Psychiatric/Behavioral: Negative.       Objective:   Physical Exam  Constitutional: She is oriented to person, place, and time. She appears well-developed and well-nourished.  HENT:  Head: Normocephalic and atraumatic.  Eyes: EOM are normal.  Neck: Normal range of motion.  Cardiovascular: Normal rate and regular rhythm.   Pulmonary/Chest: Effort normal and breath sounds normal. No respiratory distress. She has no wheezes. She has no rales.  Abdominal: Soft. Bowel sounds are normal. She exhibits no distension. There is no tenderness. There is no rebound.  Musculoskeletal: She exhibits no edema.  Neurological: She is alert and oriented to person, place, and time. Coordination normal.  Skin: Skin is warm and dry.  Psychiatric: She has a normal mood and affect.   Vitals:   11/22/16 0933  BP: 120/72  Pulse: 67  Temp: 98.5 F (36.9 C)  TempSrc: Oral  SpO2: 98%  Weight: 259 lb (117.5 kg)  Height: 5\' 3"  (1.6 m)      Assessment & Plan:

## 2016-11-22 NOTE — Assessment & Plan Note (Signed)
BP at goal on amlodipine and hctz and metoprolol. Recent labs normal and no indication for change.

## 2016-11-22 NOTE — Assessment & Plan Note (Signed)
Stable on pepcid with rare tums usage.

## 2016-11-22 NOTE — Assessment & Plan Note (Signed)
Declines all immunizations today including flu, tdap, pneumonia, shingrix. Counseled on need for colonoscopy and mammogram and she will think about it. Counseled on sun safety and mole surveillance. Given screening recommendations.

## 2016-11-22 NOTE — Assessment & Plan Note (Signed)
Given note to return to the gym to exercise.

## 2016-11-22 NOTE — Patient Instructions (Signed)
We will have you drink less fluids after 7-8 PM to see if this helps with the bladder.

## 2017-04-18 ENCOUNTER — Other Ambulatory Visit: Payer: Self-pay | Admitting: Internal Medicine

## 2017-08-02 ENCOUNTER — Ambulatory Visit (INDEPENDENT_AMBULATORY_CARE_PROVIDER_SITE_OTHER): Payer: Medicare Other | Admitting: *Deleted

## 2017-08-02 VITALS — BP 148/82 | HR 59 | Resp 18 | Ht 63.0 in | Wt 234.0 lb

## 2017-08-02 DIAGNOSIS — Z1231 Encounter for screening mammogram for malignant neoplasm of breast: Secondary | ICD-10-CM

## 2017-08-02 DIAGNOSIS — Z0000 Encounter for general adult medical examination without abnormal findings: Secondary | ICD-10-CM | POA: Diagnosis not present

## 2017-08-02 NOTE — Progress Notes (Signed)
Medical screening examination/treatment/procedure(s) were performed by non-physician practitioner and as supervising physician I was immediately available for consultation/collaboration. I agree with above.  A , MD 

## 2017-08-02 NOTE — Progress Notes (Signed)
Subjective:   Carolyn Fowler is a 72 y.o. female who presents for Medicare Annual (Subsequent) preventive examination.  Review of Systems:  No ROS.  Medicare Wellness Visit. Additional risk factors are reflected in the social history.  Cardiac Risk Factors include: advanced age (>90men, >26 women);hypertension;obesity (BMI >30kg/m2) Sleep patterns: feels rested on waking, gets up 2 times nightly to void and sleeps 7 hours nightly.    Home Safety/Smoke Alarms: Feels safe in home. Smoke alarms in place.  Living environment; residence and Firearm Safety: 2-story house, can live on one level, equipment: Radio producer, Type: [place], Type: Tub Surveyor, quantity, no firearms. Lives alone, no needs for DME, good support system Seat Belt Safety/Bike Helmet: Wears seat belt.    Objective:     Vitals: BP (!) 148/82   Pulse (!) 59   Resp 18   Ht 5\' 3"  (1.6 m)   Wt 234 lb (106.1 kg)   SpO2 98%   BMI 41.45 kg/m   Body mass index is 41.45 kg/m.  Advanced Directives 08/02/2017 07/26/2016 04/25/2015 04/11/2015 10/27/2011 10/27/2011 10/21/2011  Does Patient Have a Medical Advance Directive? Yes Yes No Yes Patient does not have advance directive - Patient does not have advance directive  Type of Advance Directive [place];Living will [place];Living will - [place];Living will - - -  Does patient want to make changes to medical advance directive? - - - No - Patient declined - - -  Copy of [place] in Chart? No - copy requested No - copy requested - No - copy requested - - -  Pre-existing out of facility DNR order (yellow form or pink MOST form) - - - - No No -    Tobacco Social History   Tobacco Use  Smoking Status Never Smoker  Smokeless Tobacco Never Used     Counseling given: Not Answered  Past Medical History:  Diagnosis Date  . Anemia   . Arthritis   . Dysrhythmia    occ palpitations- no  cardiologist  . GERD (gastroesophageal reflux disease)   . History of keloid of skin   . Hypertension   . Neuromuscular disorder ([place])    right leg tumbness and tingling from herniated disc   Past Surgical History:  Procedure Laterality Date  . ABDOMINAL HYSTERECTOMY    . APPENDECTOMY    . BACK SURGERY  Aug 2013  . BACK SURGERY  Aug 2016   L3 4 5 S1  . COLONOSCOPY W/ POLYPECTOMY    . KELOID EXCISION     on ear  . KNEE ARTHROSCOPY     left  . KNEE SURGERY    . SHOULDER OPEN ROTATOR CUFF REPAIR     left  . TUBAL LIGATION     Family History  Adopted: Yes  Problem Relation Age of Onset  . Arthritis Mother   . Heart disease Father   . Colon cancer Neg Hx   . Esophageal cancer Neg Hx   . Rectal cancer Neg Hx   . Stomach cancer Neg Hx    Social History   Socioeconomic History  . Marital status: Widowed    Spouse name: Not on file  . Number of children: 5  . Years of education: 30  . Highest education level: Not on file  Occupational History  . 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 Financial resource strain: Not hard  at all  . Food insecurity:    Worry: Never true    Inability: Never true  . Transportation needs:    Medical: No    Non-medical: No  Tobacco Use  . Smoking status: Never Smoker  . Smokeless tobacco: Never Used  Substance and Sexual Activity  . Alcohol use: No  . Drug use: No  . Sexual activity: Never  Lifestyle  . Physical activity:    Days per week: 0 days    Minutes per session: 0 min  . Stress: Not at all  Relationships  . Social connections:    Talks on phone: More than three times a week    Gets together: More than three times a week    Attends religious service: More than 4 times per year    Active member of club or organization: Yes    Attends meetings of clubs or organizations: More than 4 times per year    Relationship status: Widowed  Other Topics Concern  . Not on file  Social History Narrative     [employer], [employer] BA, Master's degree vocational ed. Married - '66- '79/divorced; married '04   2 sons - '67, '74; 3 daughters - '71, '74, '74; 15 grandchilderns; 3 great-grandchildren. Work - retired from Merchant [employer] officer. Marriage in good health.     Outpatient Encounter Medications as of 08/02/2017  Medication Sig  . amLODipine (NORVASC) 10 MG tablet Take 1 tablet (10 mg total) by mouth daily. (Patient taking differently: Take 10 mg by mouth daily. )  . aspirin 81 MG tablet Take 81 mg by mouth daily.  . famotidine (PEPCID) 20 MG tablet Take 1 tablet (20 mg total) by mouth 2 (two) times daily.  . fluticasone (FLONASE) 50 MCG/ACT nasal spray Place 2 sprays into both nostrils daily.  . hydrochlorothiazide (HYDRODIURIL) 25 MG tablet TAKE 1 TABLET BY MOUTH ONCE DAILY  . metoprolol (LOPRESSOR) 50 MG tablet Take 1 tablet (50 mg total) by mouth 2 (two) times daily.  . Multiple Vitamin (MULTIVITAMIN WITH MINERALS) TABS Take 1 tablet by mouth daily.  . Sennosides (SENOKOT PO) Take by mouth as needed.    No facility-administered encounter medications on file as of 08/02/2017.     Activities of Daily Living In your present state of health, do you have any difficulty performing the following activities: 08/02/2017  Hearing? N  Vision? N  Difficulty concentrating or making decisions? N  Walking or climbing stairs? N  Dressing or bathing? N  Doing errands, shopping? N  Preparing Food and eating ? N  Using the Toilet? N  In the past six months, have you accidently leaked urine? N  Do you have problems with loss of bowel control? N  Managing your Medications? N  Managing your Finances? N  Housekeeping or managing your Housekeeping? N  Some recent data might be hidden    Patient Care Team: Hoyt Koch, MD as PCP - General (Internal Medicine)    Assessment:   This is a routine wellness examination for Carolyn Fowler. Physical assessment deferred to PCP.   Exercise Activities and Dietary  recommendations Current Exercise Habits: The patient does not participate in regular exercise at present(states she will start to exercise at [facility] again), Exercise limited by: orthopedic condition(s)  Diet (meal preparation, eat out, water intake, caffeinated beverages, dairy products, fruits and vegetables): in general, a "healthy" diet  , well balanced, eats a variety of fruits and vegetables daily, limits salt, fat/cholesterol, sugar,carbohydrates,caffeine, drinks 4-5 glasses of  water daily.  Encouraged patient to increase daily water intake.   Goals    . Patient Stated     I am going to be more committed to going to [employer] and choosing the right foods to eat. Enjoy life, family, travel and worship God.        Fall Risk Fall Risk  08/02/2017 07/26/2016 02/18/2015  Falls in the past year? No Yes No  Number falls in past yr: - 1 -  Injury with Fall? - No -  Risk for fall due to : - Impaired mobility;Impaired balance/gait -  Follow up - Falls prevention discussed -  Comment - patient had home fall safety assessment done by [facility] PT -    Depression Screen PHQ 2/9 Scores 08/02/2017 11/22/2016 07/26/2016 02/18/2015  PHQ - 2 Score 0 0 0 0     Cognitive Function MMSE - Mini Mental State Exam 08/02/2017  Orientation to time 5  Orientation to Place 5  Registration 3  Attention/ Calculation 5  Recall 3  Language- name 2 objects 2  Language- repeat 1  Language- follow 3 step command 3  Language- read & follow direction 1  Write a sentence 1  Copy design 1  Total score 30        Immunization History  Administered Date(s) Administered  . Pneumococcal Conjugate-13 02/18/2015  . Pneumococcal Polysaccharide-23 07/01/2011  . Td 09/23/2009   Screening Tests Health Maintenance  Topic Date Due  . Hepatitis C Screening  1945-07-30  . PNA vac Low Risk Adult (2 of 2 - PPSV23) 06/30/2016  . MAMMOGRAM  07/15/2016  . INFLUENZA VACCINE  10/06/2017  . COLONOSCOPY   04/24/2018  . TETANUS/TDAP  09/24/2019  . DEXA SCAN  Completed      Plan:   Patient declined pneumonia vaccine today, states she will get the vaccine the next time she visits PCP. Mammogram referral was ordered.   Continue doing brain stimulating activities (puzzles, reading, adult coloring books, staying active) to keep memory sharp.   Continue to eat heart healthy diet (full of fruits, vegetables, whole grains, lean protein, water--limit salt, fat, and sugar intake) and increase physical activity as tolerated.  I have personally reviewed and noted the following in the patient's chart:   . Medical and social history . Use of alcohol, tobacco or illicit drugs  . Current medications and supplements . Functional ability and status . Nutritional status . Physical activity . Advanced directives . List of other physicians . Vitals . Screenings to include cognitive, depression, and falls . Referrals and appointments  In addition, I have reviewed and discussed with patient certain preventive protocols, quality metrics, and best practice recommendations. A written personalized care plan for preventive services as well as general preventive health recommendations were provided to patient.     Michiel Cowboy, RN  08/02/2017

## 2017-08-02 NOTE — Patient Instructions (Addendum)
Continue doing brain stimulating activities (puzzles, reading, adult coloring books, staying active) to keep memory sharp.   Continue to eat heart healthy diet (full of fruits, vegetables, whole grains, lean protein, water--limit salt, fat, and sugar intake) and increase physical activity as tolerated.  Carolyn Fowler , Thank you for taking time to come for your Medicare Wellness Visit. I appreciate your ongoing commitment to your health goals. Please review the following plan we discussed and let me know if I can assist you in the future.   These are the goals we discussed: Goals    . Patient Stated     I am going to be more committed to going to [employer] and choosing the right foods to eat. Enjoy life, family, travel and worship God.        This is a list of the screening recommended for you and due dates:  Health Maintenance  Topic Date Due  .  Hepatitis C: One time screening is recommended by Center for Disease Control  (CDC) for  adults born from [birth year] through 1964.   26-Jul-1945  . Pneumonia vaccines (2 of 2 - PPSV23) 06/30/2016  . Mammogram  07/15/2016  . Flu Shot  10/06/2017  . Colon Cancer Screening  04/24/2018  . Tetanus Vaccine  09/24/2019  . DEXA scan (bone density measurement)  Completed   It is important to avoid accidents which may result in broken bones.  Here are a few ideas on how to make your home safer so you will be less likely to trip or fall.  1. Use nonskid mats or non slip strips in your shower or tub, on your bathroom floor and around sinks.  If you know that you have spilled water, wipe it up! 2. In the bathroom, it is important to have properly installed grab bars on the walls or on the edge of the tub.  Towel racks are NOT strong enough for you to hold onto or to pull on for support. 3. Stairs and hallways should have enough light.  Add lamps or night lights if you need ore light. 4. It is good to have handrails on both sides of the stairs if possible.   Always fix broken handrails right away. 5. It is important to see the edges of steps.  Paint the edges of outdoor steps white so you can see them better.  Put colored tape on the edge of inside steps. 6. Throw-rugs are dangerous because they can slide.  Removing the rugs is the best idea, but if they must stay, add adhesive carpet tape to prevent slipping. 7. Do not keep things on stairs or in the halls.  Remove small furniture that blocks the halls as it may cause you to trip.  Keep telephone and electrical cords out of the way where you walk. 8. Always were sturdy, rubber-soled shoes for good support.  Never wear just socks, especially on the stairs.  Socks may cause you to slip or fall.  Do not wear full-length housecoats as you can easily trip on the bottom.  9. Place the things you use the most on the shelves that are the easiest to reach.  If you use a stepstool, make sure it is in good condition.  If you feel unsteady, DO NOT climb, ask for help. 10. If a health professional advises you to use a cane or walker, do not be ashamed.  These items can keep you from falling and breaking your bones.  Health Maintenance,  Female Adopting a healthy lifestyle and getting preventive care can go a long way to promote health and wellness. Talk with your health care provider about what schedule of regular examinations is right for you. This is a good chance for you to check in with your provider about disease prevention and staying healthy. In between checkups, there are plenty of things you can do on your own. Experts have done a lot of research about which lifestyle changes and preventive measures are most likely to keep you healthy. Ask your health care provider for more information. Weight and diet Eat a healthy diet  Be sure to include plenty of vegetables, fruits, low-fat dairy products, and lean protein.  Do not eat a lot of foods high in solid fats, added sugars, or salt.  Get regular exercise.  This is one of the most important things you can do for your health. ? Most adults should exercise for at least 150 minutes each week. The exercise should increase your heart rate and make you sweat (moderate-intensity exercise). ? Most adults should also do strengthening exercises at least twice a week. This is in addition to the moderate-intensity exercise.  Maintain a healthy weight  Body mass index (BMI) is a measurement that can be used to identify possible weight problems. It estimates body fat based on height and weight. Your health care provider can help determine your BMI and help you achieve or maintain a healthy weight.  For females 57 years of age and older: ? A BMI below 18.5 is considered underweight. ? A BMI of 18.5 to 24.9 is normal. ? A BMI of 25 to 29.9 is considered overweight. ? A BMI of 30 and above is considered obese.  Watch levels of cholesterol and blood lipids  You should start having your blood tested for lipids and cholesterol at 71 years of age, then have this test every 5 years.  You may need to have your cholesterol levels checked more often if: ? Your lipid or cholesterol levels are high. ? You are older than 71 years of age. ? You are at high risk for heart disease.  Cancer screening Lung Cancer  Lung cancer screening is recommended for adults 61-69 years old who are at high risk for lung cancer because of a history of smoking.  A yearly low-dose CT scan of the lungs is recommended for people who: ? Currently smoke. ? Have quit within the past 15 years. ? Have at least a 30-pack-year history of smoking. A pack year is smoking an average of one pack of cigarettes a day for 1 year.  Yearly screening should continue until it has been 15 years since you quit.  Yearly screening should stop if you develop a health problem that would prevent you from having lung cancer treatment.  Breast Cancer  Practice breast self-awareness. This means understanding  how your breasts normally appear and feel.  It also means doing regular breast self-exams. Let your health care provider know about any changes, no matter how small.  If you are in your 20s or 30s, you should have a clinical breast exam (CBE) by a health care provider every 1-3 years as part of a regular health exam.  If you are 34 or older, have a CBE every year. Also consider having a breast X-ray (mammogram) every year.  If you have a family history of breast cancer, talk to your health care provider about genetic screening.  If you are at high risk for  breast cancer, talk to your health care provider about having an MRI and a mammogram every year.  Breast cancer gene (BRCA) assessment is recommended for women who have family members with BRCA-related cancers. BRCA-related cancers include: ? Breast. ? Ovarian. ? Tubal. ? Peritoneal cancers.  Results of the assessment will determine the need for genetic counseling and BRCA1 and BRCA2 testing.  Cervical Cancer Your health care provider may recommend that you be screened regularly for cancer of the pelvic organs (ovaries, uterus, and vagina). This screening involves a pelvic examination, including checking for microscopic changes to the surface of your cervix (Pap test). You may be encouraged to have this screening done every 3 years, beginning at age 19.  For women ages 83-65, health care providers may recommend pelvic exams and Pap testing every 3 years, or they may recommend the Pap and pelvic exam, combined with testing for human papilloma virus (HPV), every 5 years. Some types of HPV increase your risk of cervical cancer. Testing for HPV may also be done on women of any age with unclear Pap test results.  Other health care providers may not recommend any screening for nonpregnant women who are considered low risk for pelvic cancer and who do not have symptoms. Ask your health care provider if a screening pelvic exam is right for  you.  If you have had past treatment for cervical cancer or a condition that could lead to cancer, you need Pap tests and screening for cancer for at least 20 years after your treatment. If Pap tests have been discontinued, your risk factors (such as having a new sexual partner) need to be reassessed to determine if screening should resume. Some women have medical problems that increase the chance of getting cervical cancer. In these cases, your health care provider may recommend more frequent screening and Pap tests.  Colorectal Cancer  This type of cancer can be detected and often prevented.  Routine colorectal cancer screening usually begins at 71 years of age and continues through 71 years of age.  Your health care provider may recommend screening at an earlier age if you have risk factors for colon cancer.  Your health care provider may also recommend using home test kits to check for hidden blood in the stool.  A small camera at the end of a tube can be used to examine your colon directly (sigmoidoscopy or colonoscopy). This is done to check for the earliest forms of colorectal cancer.  Routine screening usually begins at age 82.  Direct examination of the colon should be repeated every 5-10 years through 71 years of age. However, you may need to be screened more often if early forms of precancerous polyps or small growths are found.  Skin Cancer  Check your skin from head to toe regularly.  Tell your health care provider about any new moles or changes in moles, especially if there is a change in a mole's shape or color.  Also tell your health care provider if you have a mole that is larger than the size of a pencil eraser.  Always use sunscreen. Apply sunscreen liberally and repeatedly throughout the day.  Protect yourself by wearing long sleeves, pants, a wide-brimmed hat, and sunglasses whenever you are outside.  Heart disease, diabetes, and high blood pressure  High blood  pressure causes heart disease and increases the risk of stroke. High blood pressure is more likely to develop in: ? People who have blood pressure in the high end of the  normal range (130-139/85-89 mm Hg). ? People who are overweight or obese. ? People who are African American.  If you are 74-45 years of age, have your blood pressure checked every 3-5 years. If you are 47 years of age or older, have your blood pressure checked every year. You should have your blood pressure measured twice-once when you are at a hospital or clinic, and once when you are not at a hospital or clinic. Record the average of the two measurements. To check your blood pressure when you are not at a hospital or clinic, you can use: ? An automated blood pressure machine at a pharmacy. ? A home blood pressure monitor.  If you are between 23 years and 67 years old, ask your health care provider if you should take aspirin to prevent strokes.  Have regular diabetes screenings. This involves taking a blood sample to check your fasting blood sugar level. ? If you are at a normal weight and have a low risk for diabetes, have this test once every three years after 71 years of age. ? If you are overweight and have a high risk for diabetes, consider being tested at a younger age or more often. Preventing infection Hepatitis B  If you have a higher risk for hepatitis B, you should be screened for this virus. You are considered at high risk for hepatitis B if: ? You were born in a country where hepatitis B is common. Ask your health care provider which countries are considered high risk. ? Your parents were born in a high-risk country, and you have not been immunized against hepatitis B (hepatitis B vaccine). ? You have HIV or AIDS. ? You use needles to inject street drugs. ? You live with someone who has hepatitis B. ? You have had sex with someone who has hepatitis B. ? You get hemodialysis treatment. ? You take certain  medicines for conditions, including cancer, organ transplantation, and autoimmune conditions.  Hepatitis C  Blood testing is recommended for: ? Everyone born from [birth year] through 1964. ? Anyone with known risk factors for hepatitis C.  Sexually transmitted infections (STIs)  You should be screened for sexually transmitted infections (STIs) including gonorrhea and chlamydia if: ? You are sexually active and are younger than 71 years of age. ? You are older than 71 years of age and your health care provider tells you that you are at risk for this type of infection. ? Your sexual activity has changed since you were last screened and you are at an increased risk for chlamydia or gonorrhea. Ask your health care provider if you are at risk.  If you do not have HIV, but are at risk, it may be recommended that you take a prescription medicine daily to prevent HIV infection. This is called pre-exposure prophylaxis (PrEP). You are considered at risk if: ? You are sexually active and do not regularly use condoms or know the HIV status of your partner(s). ? You take drugs by injection. ? You are sexually active with a partner who has HIV.  Talk with your health care provider about whether you are at high risk of being infected with HIV. If you choose to begin PrEP, you should first be tested for HIV. You should then be tested every 3 months for as long as you are taking PrEP. Pregnancy  If you are premenopausal and you may become pregnant, ask your health care provider about preconception counseling.  If you may become pregnant, take  400 to 800 micrograms (mcg) of folic acid every day.  If you want to prevent pregnancy, talk to your health care provider about birth control (contraception). Osteoporosis and menopause  Osteoporosis is a disease in which the bones lose minerals and strength with aging. This can result in serious bone fractures. Your risk for osteoporosis can be identified using a bone  density scan.  If you are 44 years of age or older, or if you are at risk for osteoporosis and fractures, ask your health care provider if you should be screened.  Ask your health care provider whether you should take a calcium or vitamin D supplement to lower your risk for osteoporosis.  Menopause may have certain physical symptoms and risks.  Hormone replacement therapy may reduce some of these symptoms and risks. Talk to your health care provider about whether hormone replacement therapy is right for you. Follow these instructions at home:  Schedule regular health, dental, and eye exams.  Stay current with your immunizations.  Do not use any tobacco products including cigarettes, chewing tobacco, or electronic cigarettes.  If you are pregnant, do not drink alcohol.  If you are breastfeeding, limit how much and how often you drink alcohol.  Limit alcohol intake to no more than 1 drink per day for nonpregnant women. One drink equals 12 ounces of beer, 5 ounces of wine, or 1 ounces of hard liquor.  Do not use street drugs.  Do not share needles.  Ask your health care provider for help if you need support or information about quitting drugs.  Tell your health care provider if you often feel depressed.  Tell your health care provider if you have ever been abused or do not feel safe at home. This information is not intended to replace advice given to you by your health care provider. Make sure you discuss any questions you have with your health care provider. Document Released: 09/07/2010 Document Revised: 07/31/2015 Document Reviewed: 11/26/2014 Elsevier Interactive Patient Education  [employer].

## 2017-08-22 ENCOUNTER — Ambulatory Visit: Payer: Medicare Other | Admitting: Internal Medicine

## 2017-08-22 ENCOUNTER — Telehealth: Payer: Self-pay | Admitting: Internal Medicine

## 2017-08-22 MED ORDER — HYDROCHLOROTHIAZIDE 25 MG PO TABS: 25.0000 mg | ORAL_TABLET | Freq: Every day | ORAL | 0 refills | Status: DC

## 2017-08-22 MED ORDER — METOPROLOL TARTRATE 50 MG PO TABS: 50.0000 mg | ORAL_TABLET | Freq: Two times a day (BID) | ORAL | 0 refills | Status: DC

## 2017-08-22 MED ORDER — FAMOTIDINE 20 MG PO TABS: 20.0000 mg | ORAL_TABLET | Freq: Two times a day (BID) | ORAL | 0 refills | Status: DC

## 2017-08-22 NOTE — Telephone Encounter (Signed)
Refills sent

## 2017-08-22 NOTE — Telephone Encounter (Signed)
Patients requesting refills on metoprolol, hydrochlorothiazide, and  Famotidine.  Patient uses Paediatric nurse at [facility].

## 2017-08-29 ENCOUNTER — Ambulatory Visit
Admission: RE | Admit: 2017-08-29 | Discharge: 2017-08-29 | Disposition: A | Discharge status: Home / Self Care | Payer: Medicare Other | Source: Ambulatory Visit | Attending: Internal Medicine | Admitting: Internal Medicine

## 2017-08-29 DIAGNOSIS — Z1231 Encounter for screening mammogram for malignant neoplasm of breast: Secondary | ICD-10-CM

## 2017-09-15 ENCOUNTER — Other Ambulatory Visit (INDEPENDENT_AMBULATORY_CARE_PROVIDER_SITE_OTHER): Payer: Medicare Other

## 2017-09-15 ENCOUNTER — Ambulatory Visit: Payer: Medicare Other | Admitting: Internal Medicine

## 2017-09-15 ENCOUNTER — Encounter: Payer: Self-pay | Admitting: Internal Medicine

## 2017-09-15 VITALS — BP 142/92 | HR 69 | Temp 98.3°F | Ht 63.0 in | Wt 235.0 lb

## 2017-09-15 DIAGNOSIS — L748 Other eccrine sweat disorders: Secondary | ICD-10-CM

## 2017-09-15 DIAGNOSIS — Z23 Encounter for immunization: Secondary | ICD-10-CM | POA: Diagnosis not present

## 2017-09-15 DIAGNOSIS — I10 Essential (primary) hypertension: Secondary | ICD-10-CM

## 2017-09-15 LAB — LIPID PANEL
CHOLESTEROL: 185 mg/dL (ref 0–200)
HDL: 61.1 mg/dL (ref >39.00)
LDL Cholesterol: 112 mg/dL — ABNORMAL HIGH (ref 0–99)
NonHDL: 123.57
TRIGLYCERIDES: 59 mg/dL (ref 0.0–149.0)
Total CHOL/HDL Ratio: 3
VLDL: 11.8 mg/dL (ref 0.0–40.0)

## 2017-09-15 LAB — CBC
HCT: 42.8 % (ref 36.0–46.0)
Hemoglobin: 13.8 g/dL (ref 12.0–15.0)
MCHC: 32.2 g/dL (ref 30.0–36.0)
MCV: 73.2 fl — AB (ref 78.0–100.0)
PLATELETS: 224 10*3/uL (ref 150.0–400.0)
RBC: 5.85 Mil/uL — AB (ref 3.87–5.11)
RDW: 14.9 % (ref 11.5–15.5)
WBC: 5.8 10*3/uL (ref 4.0–10.5)

## 2017-09-15 LAB — COMPREHENSIVE METABOLIC PANEL
ALBUMIN: 4 g/dL (ref 3.5–5.2)
ALK PHOS: 70 U/L (ref 39–117)
ALT: 12 U/L (ref 0–35)
AST: 14 U/L (ref 0–37)
BILIRUBIN TOTAL: 0.7 mg/dL (ref 0.2–1.2)
BUN: 16 mg/dL (ref 6–23)
CALCIUM: 9.3 mg/dL (ref 8.4–10.5)
CO2: 28 meq/L (ref 19–32)
CREATININE: 0.6 mg/dL (ref 0.40–1.20)
Chloride: 103 mEq/L (ref 96–112)
GFR: 126.73 mL/min (ref >60.00)
Glucose, Bld: 99 mg/dL (ref 70–99)
Potassium: 3.6 mEq/L (ref 3.5–5.1)
Sodium: 138 mEq/L (ref 135–145)
TOTAL PROTEIN: 7.1 g/dL (ref 6.0–8.3)

## 2017-09-15 LAB — HEMOGLOBIN A1C: Hgb A1c MFr Bld: 5.8 % (ref 4.6–6.5)

## 2017-09-15 NOTE — Patient Instructions (Signed)
We are checking the labs today and have given you the pneumonia shot.   Let us know if you want to go see the surgeon to get that spot removed.

## 2017-09-15 NOTE — Progress Notes (Signed)
   Subjective:    Patient ID: Carolyn Fowler, female    DOB: [birth year], 72 y.o.   MRN: [id number]  HPI The patient is a 72 YO female coming in for cyst under her right arm. Present for some months however has been growing in recent months. She denies pain or redness in the area. She has tried to pop it in the past and it has been drained some time in the past. She denies fevers or chills.  She is also needing follow up of her blood pressure (taking hctz and metoprolol, not taking amlodipine, states BP is at goal at home, denies headaches or chest pains, denies SOB), and her weight (she has tried losing weight, is frustrated about this, she cannot work on this right now, complicated by hypertension and hyperlipidemia and weight stable from prior).   Review of Systems  Constitutional: Negative.   HENT: Negative.   Eyes: Negative.   Respiratory: Negative for cough, chest tightness and shortness of breath.   Cardiovascular: Negative for chest pain, palpitations and leg swelling.  Gastrointestinal: Negative for abdominal distention, abdominal pain, constipation, diarrhea, nausea and vomiting.  Musculoskeletal: Negative.   Skin: Positive for wound.  Neurological: Negative.   Psychiatric/Behavioral: Negative.       Objective:   Physical Exam  Constitutional: She is oriented to person, place, and time. She appears well-developed and well-nourished.  overweight  HENT:  Head: Normocephalic and atraumatic.  Eyes: EOM are normal.  Neck: Normal range of motion.  Cardiovascular: Normal rate and regular rhythm.  Pulmonary/Chest: Effort normal and breath sounds normal. No respiratory distress. She has no wheezes. She has no rales.  Abdominal: Soft. Bowel sounds are normal. She exhibits no distension. There is no tenderness. There is no rebound.  Musculoskeletal: She exhibits no edema.  Neurological: She is alert and oriented to person, place, and time. Coordination normal.  Skin: Skin is warm  and dry.  Right sweat gland cyst, firm to touch, no fluctuance, black head on the lateral aspect of the cyst, about 3 cm oval shape  Psychiatric: She has a normal mood and affect.   Vitals:   09/15/17 0804  BP: (!) 142/92  Pulse: 69  Temp: 98.3 F (36.8 C)  TempSrc: Oral  SpO2: 97%  Weight: 235 lb (106.6 kg)  Height: 5\' 3"  (1.6 m)      Assessment & Plan:  Pneumonia 23 given at visit.

## 2017-09-16 DIAGNOSIS — L748 Other eccrine sweat disorders: Secondary | ICD-10-CM | POA: Insufficient documentation

## 2017-09-16 NOTE — Assessment & Plan Note (Signed)
BP slightly above goal on her metoprolol and hctz. She is not taking amlodipine. Will maintain for now. Checking CMP and adjust as needed.

## 2017-09-16 NOTE — Assessment & Plan Note (Signed)
Needs check for HgA1c, lipid panel to check for complications. She is not able to lose weight at this time due to stress in her life.

## 2017-09-16 NOTE — Assessment & Plan Note (Signed)
Likely long term and some scar or keloid tissue within. There is no fluctuance to suggest need for I and D today. We talked about surgeon referral for removal and she is not sure she wants to do that.

## 2017-11-28 ENCOUNTER — Ambulatory Visit: Payer: Medicare Other | Admitting: Internal Medicine

## 2018-02-16 ENCOUNTER — Other Ambulatory Visit: Payer: Self-pay | Admitting: Internal Medicine

## 2018-05-06 ENCOUNTER — Encounter: Payer: Self-pay | Admitting: Gastroenterology

## 2018-08-24 ENCOUNTER — Ambulatory Visit (INDEPENDENT_AMBULATORY_CARE_PROVIDER_SITE_OTHER): Payer: Medicare Other | Admitting: *Deleted

## 2018-08-24 ENCOUNTER — Other Ambulatory Visit: Payer: Self-pay | Admitting: *Deleted

## 2018-08-24 DIAGNOSIS — Z0000 Encounter for general adult medical examination without abnormal findings: Secondary | ICD-10-CM

## 2018-08-24 MED ORDER — FLUTICASONE PROPIONATE 50 MCG/ACT NA SUSP: 2.0000 | Freq: Every day | NASAL | 6 refills | Status: DC

## 2018-08-24 NOTE — Progress Notes (Signed)
Medical screening examination/treatment/procedure(s) were performed by non-physician practitioner and as supervising physician I was immediately available for consultation/collaboration. I agree with above.  A , MD 

## 2018-08-24 NOTE — Patient Instructions (Addendum)
If you cannot attend class in person, you can still exercise at home. Video taped versions of AHOY classes are shown on [employer] (GTN) at 8 am and 1 pm Mondays through Fridays. You can also purchase a copy of the AHOY DVD by calling [place] (GTN) [employer]. GTN is available on [employer] channel 13 with a digital cable box and on [employer] channel 31. GTN is also available on [employer], channel 99. To view GTN, go to channel 99, press OK, select [place], then select GTN to start the channel.  Continue doing brain stimulating activities (puzzles, reading, adult coloring books, staying active) to keep memory sharp.   Continue to eat heart healthy diet (full of fruits, vegetables, whole grains, lean protein, water--limit salt, fat, and sugar intake) and increase physical activity as tolerated.   Carolyn Fowler , Thank you for taking time to come for your Medicare Wellness Visit. I appreciate your ongoing commitment to your health goals. Please review the following plan we discussed and let me know if I can assist you in the future.   These are the goals we discussed: Goals    . Patient Stated     I am going to be more committed to going to [employer] and choosing the right foods to eat. Enjoy life, family, travel and worship God.     Marland Kitchen weight loss     Lost weight in 5 pound increments, continue to exercise, and eat healthy.        This is a list of the screening recommended for you and due dates:  Health Maintenance  Topic Date Due  .  Hepatitis C: One time screening is recommended by Center for Disease Control  (CDC) for  adults born from [birth year] through 1964.   1945/11/18  . Colon Cancer Screening  04/24/2018  . Flu Shot  10/07/2018  . Mammogram  08/30/2019  . Tetanus Vaccine  09/24/2019  . DEXA scan (bone density measurement)  Completed  . Pneumonia vaccines  Completed    Preventive Care 98 Years and Older,  Female Preventive care refers to lifestyle choices and visits with your health care provider that can promote health and wellness. What does preventive care include?  A yearly physical exam. This is also called an annual well check.  Dental exams once or twice a year.  Routine eye exams. Ask your health care provider how often you should have your eyes checked.  Personal lifestyle choices, including: ? Daily care of your teeth and gums. ? Regular physical activity. ? Eating a healthy diet. ? Avoiding tobacco and drug use. ? Limiting alcohol use. ? Practicing safe sex. ? Taking low-dose aspirin every day. ? Taking vitamin and mineral supplements as recommended by your health care provider. What happens during an annual well check? The services and screenings done by your health care provider during your annual well check will depend on your age, overall health, lifestyle risk factors, and family history of disease. Counseling Your health care provider may ask you questions about your:  Alcohol use.  Tobacco use.  Drug use.  Emotional well-being.  Home and relationship well-being.  Sexual activity.  Eating habits.  History of falls.  Memory and ability to understand (cognition).  Work and work Statistician.  Reproductive health.  Screening You may have the following tests or measurements:  Height, weight, and BMI.  Blood pressure.  Lipid and cholesterol levels. These may be checked every 5 years, or more  frequently if you are over 44 years old.  Skin check.  Lung cancer screening. You may have this screening every year starting at age 72 if you have a 30-pack-year history of smoking and currently smoke or have quit within the past 15 years.  Colorectal cancer screening. All adults should have this screening starting at age 39 and continuing until age 72. You will have tests every 1-10 years, depending on your results and the type of screening test. People at  increased risk should start screening at an earlier age. Screening tests may include: ? Guaiac-based fecal occult blood testing. ? Fecal immunochemical test (FIT). ? Stool DNA test. ? Virtual colonoscopy. ? Sigmoidoscopy. During this test, a flexible tube with a tiny camera (sigmoidoscope) is used to examine your rectum and lower colon. The sigmoidoscope is inserted through your anus into your rectum and lower colon. ? Colonoscopy. During this test, a long, thin, flexible tube with a tiny camera (colonoscope) is used to examine your entire colon and rectum.  Hepatitis C blood test.  Hepatitis B blood test.  Sexually transmitted disease (STD) testing.  Diabetes screening. This is done by checking your blood sugar (glucose) after you have not eaten for a while (fasting). You may have this done every 1-3 years.  Bone density scan. This is done to screen for osteoporosis. You may have this done starting at age 72.  Mammogram. This may be done every 1-2 years. Talk to your health care provider about how often you should have regular mammograms. Talk with your health care provider about your test results, treatment options, and if necessary, the need for more tests. Vaccines Your health care provider may recommend certain vaccines, such as:  Influenza vaccine. This is recommended every year.  Tetanus, diphtheria, and acellular pertussis (Tdap, Td) vaccine. You may need a Td booster every 10 years.  Varicella vaccine. You may need this if you have not been vaccinated.  Zoster vaccine. You may need this after age 72.  Measles, mumps, and rubella (MMR) vaccine. You may need at least one dose of MMR if you were born in 1957 or later. You may also need a second dose.  Pneumococcal 13-valent conjugate (PCV13) vaccine. One dose is recommended after age 72.  Pneumococcal polysaccharide (PPSV23) vaccine. One dose is recommended after age 66.  Meningococcal vaccine. You may need this if you have  certain conditions.  Hepatitis A vaccine. You may need this if you have certain conditions or if you travel or work in places where you may be exposed to hepatitis A.  Hepatitis B vaccine. You may need this if you have certain conditions or if you travel or work in places where you may be exposed to hepatitis B.  Haemophilus influenzae type b (Hib) vaccine. You may need this if you have certain conditions. Talk to your health care provider about which screenings and vaccines you need and how often you need them. This information is not intended to replace advice given to you by your health care provider. Make sure you discuss any questions you have with your health care provider. Document Released: 03/21/2015 Document Revised: 04/14/2017 Document Reviewed: 12/24/2014 Elsevier Interactive Patient Education  2019 [employer].

## 2018-08-24 NOTE — Progress Notes (Signed)
Subjective:   Carolyn Fowler is a 73 y.o. female who presents for Medicare Annual (Subsequent) preventive examination. I connected with patient by a telephone and verified that I am speaking with the correct person using two identifiers. Patient stated full name and DOB. Patient gave permission to continue with telephonic visit. Patient's location was at home and Nurse's location was at [place] office.   Review of Systems:     Sleep patterns: feels rested on waking, gets up 0-2 times nightly to void and sleeps 8-9 hours nightly.    Home Safety/Smoke Alarms: Feels safe in home. Smoke alarms in place.  Living environment; residence and Firearm Safety: 1-story house/ trailer. Lives alone, no needs for DME, good support system Seat Belt Safety/Bike Helmet: Wears seat belt.      Objective:     Vitals: There were no vitals taken for this visit.  There is no height or weight on file to calculate BMI.  Advanced Directives 08/24/2018 08/02/2017 07/26/2016 04/25/2015 04/11/2015 10/27/2011 10/27/2011  Does Patient Have a Medical Advance Directive? No Yes Yes No Yes Patient does not have advance directive -  Type of Advance Directive - [place];Living will [place];Living will - [place];Living will - -  Does patient want to make changes to medical advance directive? Yes (MAU/Ambulatory/Procedural Areas - Information given) - - - No - Patient declined - -  Copy of [place] in Chart? - No - copy requested No - copy requested - No - copy requested - -  Pre-existing out of facility DNR order (yellow form or pink MOST form) - - - - - No No    Tobacco Social History   Tobacco Use  Smoking Status Never Smoker  Smokeless Tobacco Never Used     Counseling given: Not Answered  Past Medical History:  Diagnosis Date  . Anemia   . Arthritis   . Dysrhythmia    occ palpitations- no cardiologist  . GERD  (gastroesophageal reflux disease)   . History of keloid of skin   . Hypertension   . Neuromuscular disorder ([place])    right leg tumbness and tingling from herniated disc   Past Surgical History:  Procedure Laterality Date  . ABDOMINAL HYSTERECTOMY    . APPENDECTOMY    . BACK SURGERY  Aug 2013  . BACK SURGERY  Aug 2016   L3 4 5 S1  . COLONOSCOPY W/ POLYPECTOMY    . KELOID EXCISION     on ear  . KNEE ARTHROSCOPY     left  . KNEE SURGERY    . SHOULDER OPEN ROTATOR CUFF REPAIR     left  . TUBAL LIGATION     Family History  Adopted: Yes  Problem Relation Age of Onset  . Arthritis Mother   . Heart disease Father   . Colon cancer Neg Hx   . Esophageal cancer Neg Hx   . Rectal cancer Neg Hx   . Stomach cancer Neg Hx    Social History   Socioeconomic History  . Marital status: Widowed    Spouse name: Not on file  . Number of children: 5  . Years of education: 32  . Highest education level: Not on file  Occupational History  . 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 Financial resource strain: Not hard at all  . Food insecurity    Worry: Never true    Inability:  Never true  . Transportation needs    Medical: No    Non-medical: No  Tobacco Use  . Smoking status: Never Smoker  . Smokeless tobacco: Never Used  Substance and Sexual Activity  . Alcohol use: No  . Drug use: No  . Sexual activity: Never  Lifestyle  . Physical activity    Days per week: 4 days    Minutes per session: 40 min  . Stress: Not at all  Relationships  . Social connections    Talks on phone: More than three times a week    Gets together: More than three times a week    Attends religious service: More than 4 times per year    Active member of club or organization: Yes    Attends meetings of clubs or organizations: More than 4 times per year    Relationship status: Widowed  Other Topics Concern  . Not on file  Social History Narrative   [employer], Master's  degree vocational ed. Married - '66- '79/divorced; married '04   2 sons - '67, '74; 3 daughters - '71, '74, '74; 15 grandchilderns; 3 great-grandchildren. Work - retired from Merchant [employer] officer. Marriage in good health.     Outpatient Encounter Medications as of 08/24/2018  Medication Sig  . aspirin 81 MG tablet Take 81 mg by mouth daily.  . famotidine (PEPCID) 20 MG tablet TAKE 1 TABLET BY MOUTH TWICE DAILY  . fluticasone (FLONASE) 50 MCG/ACT nasal spray Place 2 sprays into both nostrils daily.  . hydrochlorothiazide (HYDRODIURIL) 25 MG tablet TAKE 1 TABLET BY MOUTH ONCE DAILY  . metoprolol tartrate (LOPRESSOR) 50 MG tablet TAKE 1 TABLET BY MOUTH TWICE DAILY  . Multiple Vitamin (MULTIVITAMIN WITH MINERALS) TABS Take 1 tablet by mouth daily.  . Sennosides (SENOKOT PO) Take by mouth as needed.   . [DISCONTINUED] amLODipine (NORVASC) 10 MG tablet Take 1 tablet (10 mg total) by mouth daily. (Patient not taking: Reported on 09/15/2017)   No facility-administered encounter medications on file as of 08/24/2018.     Activities of Daily Living In your present state of health, do you have any difficulty performing the following activities: 08/24/2018  Hearing? N  Vision? N  Difficulty concentrating or making decisions? N  Walking or climbing stairs? N  Dressing or bathing? N  Doing errands, shopping? N  Preparing Food and eating ? N  Using the Toilet? N  In the past six months, have you accidently leaked urine? N  Do you have problems with loss of bowel control? N  Managing your Medications? N  Managing your Finances? N  Housekeeping or managing your Housekeeping? N  Some recent data might be hidden    Patient Care Team: Hoyt Koch, MD as PCP - General (Internal Medicine)    Assessment:   This is a routine wellness examination for [place]. Physical assessment deferred to PCP.   Exercise Activities and Dietary recommendations Current Exercise Habits: Home exercise routine  Diet (meal preparation, eat out, water intake, caffeinated beverages, dairy products, fruits and vegetables): in general, a "healthy" diet  , well balanced   Reviewed heart healthy diet. Encouraged patient to maintain  daily water and healthy fluid intake.  Goals    . Patient Stated     I am going to be more committed to going to [employer] and choosing the right foods to eat. Enjoy life, family, travel and worship God.     Marland Kitchen weight loss     Lost  weight in 5 pound increments, continue to exercise, and eat healthy.        Fall Risk Fall Risk  08/24/2018 08/02/2017 07/26/2016 02/18/2015  Falls in the past year? 0 No Yes No  Number falls in past yr: 0 - 1 -  Injury with Fall? - - No -  Risk for fall due to : Impaired balance/gait;Impaired mobility - Impaired mobility;Impaired balance/gait -  Follow up Falls prevention discussed - Falls prevention discussed -  Comment - - patient had home fall safety assessment done by [facility] PT -    Depression Screen PHQ 2/9 Scores 08/24/2018 08/02/2017 11/22/2016 07/26/2016  PHQ - 2 Score 0 0 0 0     Cognitive Function MMSE - Mini Mental State Exam 08/02/2017  Orientation to time 5  Orientation to Place 5  Registration 3  Attention/ Calculation 5  Recall 3  Language- name 2 objects 2  Language- repeat 1  Language- follow 3 step command 3  Language- read & follow direction 1  Write a sentence 1  Copy design 1  Total score 30       Ad8 score reviewed for issues:  Issues making decisions: no  Less interest in hobbies / activities: no  Repeats questions, stories (family complaining): no  Trouble using ordinary gadgets (microwave, computer, phone):no  Forgets the month or year: no  Mismanaging finances: no  Remembering appts: no  Daily problems with thinking and/or memory: no Ad8 score is= 0  Immunization History  Administered Date(s) Administered  . Pneumococcal Conjugate-13 02/18/2015  . Pneumococcal Polysaccharide-23  07/01/2011, 09/15/2017  . Td 09/23/2009      Screening Tests Health Maintenance  Topic Date Due  . Hepatitis C Screening  27-Feb-1946  . COLONOSCOPY  04/24/2018  . INFLUENZA VACCINE  10/07/2018  . MAMMOGRAM  08/30/2019  . TETANUS/TDAP  09/24/2019  . DEXA SCAN  Completed  . PNA vac Low Risk Adult  Completed       Plan:      Reviewed health maintenance screenings with patient today and relevant education, vaccines, and/or referrals were provided.  Patient will call to set up her colonoscopy.  Continue doing brain stimulating activities (puzzles, reading, adult coloring books, staying active) to keep memory sharp.   Continue to eat heart healthy diet (full of fruits, vegetables, whole grains, lean protein, water--limit salt, fat, and sugar intake) and increase physical activity as tolerated.  I have personally reviewed and noted the following in the patient's chart:   . Medical and social history . Use of alcohol, tobacco or illicit drugs  . Current medications and supplements . Functional ability and status . Nutritional status . Physical activity . Advanced directives . List of other physicians . Screenings to include cognitive, depression, and falls . Referrals and appointments  In addition, I have reviewed and discussed with patient certain preventive protocols, quality metrics, and best practice recommendations. A written personalized care plan for preventive services as well as general preventive health recommendations were provided to patient.     Michiel Cowboy, RN  08/24/2018

## 2018-08-25 ENCOUNTER — Other Ambulatory Visit: Payer: Self-pay | Admitting: Internal Medicine

## 2018-10-06 ENCOUNTER — Encounter: Payer: Self-pay | Admitting: Gastroenterology

## 2018-10-20 ENCOUNTER — Other Ambulatory Visit: Payer: Self-pay | Admitting: Internal Medicine

## 2018-11-24 ENCOUNTER — Encounter: Payer: Self-pay | Admitting: Gastroenterology

## 2018-11-24 ENCOUNTER — Ambulatory Visit (AMBULATORY_SURGERY_CENTER): Payer: Self-pay | Admitting: *Deleted

## 2018-11-24 ENCOUNTER — Other Ambulatory Visit: Payer: Self-pay

## 2018-11-24 VITALS — Temp 96.9°F | Ht 63.0 in | Wt 248.0 lb

## 2018-11-24 DIAGNOSIS — Z86010 Personal history of colonic polyps: Secondary | ICD-10-CM

## 2018-11-24 MED ORDER — PEG 3350-KCL-NA BICARB-NACL 420 G PO SOLR: 4000.0000 mL | Freq: Once | ORAL | 0 refills | Status: AC

## 2018-11-24 NOTE — Progress Notes (Signed)
Eggs cause vomiting- but she can eat some cookies and some cakes but no pies -- no  soy allergy known to patient  No issues with past sedation with any surgeries  or procedures, no intubation problems  No diet pills per patient No home 02 use per patient  No blood thinners per patient  Pt denies issues with constipation  No A fib or A flutter  EMMI video sent to pt's e mail   Due to the COVID-19 pandemic we are asking patients to follow these guidelines. Please only bring one care partner. Please be aware that your care partner may wait in the car in the parking lot or if they feel like they will be too hot to wait in the car, they may wait in the lobby on the 4th floor. All care partners are required to wear a mask the entire time (we do not have any that we can provide them), they need to practice social distancing, and we will do a Covid check for all patient's and care partners when you arrive. Also we will check their temperature and your temperature. If the care partner waits in their car they need to stay in the parking lot the entire time and we will call them on their cell phone when the patient is ready for discharge so they can bring the car to the front of the building. Also all patient's will need to wear a mask into building.

## 2018-11-27 ENCOUNTER — Other Ambulatory Visit: Payer: Self-pay | Admitting: Internal Medicine

## 2018-11-27 MED ORDER — HYDROCHLOROTHIAZIDE 25 MG PO TABS: 25.0000 mg | ORAL_TABLET | Freq: Every day | ORAL | 0 refills | Status: DC

## 2018-11-27 NOTE — Telephone Encounter (Signed)
Routing to CMA 

## 2018-11-27 NOTE — Telephone Encounter (Signed)
Requested medication (s) are due for refill today -yes  Requested medication (s) are on the active medication list -yes  Future visit scheduled -yes-10/16  Last refill: 6/22  Notes to clinic: Patient is requesting medication last filled with needs office visit for further refills. Sent for review.  Requested Prescriptions  Pending Prescriptions Disp Refills   hydrochlorothiazide (HYDRODIURIL) 25 MG tablet 90 tablet 0    Sig: Take 1 tablet (25 mg total) by mouth daily. Needs office visit for further refills     Cardiovascular: Diuretics - Thiazide Failed - 11/27/2018 10:38 AM      Failed - Ca in normal range and within 360 days    Calcium  Date Value Ref Range Status  09/15/2017 9.3 8.4 - 10.5 mg/dL Final         Failed - Cr in normal range and within 360 days    Creatinine, Ser  Date Value Ref Range Status  09/15/2017 0.60 0.40 - 1.20 mg/dL Final         Failed - K in normal range and within 360 days    Potassium  Date Value Ref Range Status  09/15/2017 3.6 3.5 - 5.1 mEq/L Final         Failed - Na in normal range and within 360 days    Sodium  Date Value Ref Range Status  09/15/2017 138 135 - 145 mEq/L Final         Failed - Last BP in normal range    BP Readings from Last 1 Encounters:  09/15/17 (!) 142/92         Passed - Valid encounter within last 6 months    Recent Outpatient Visits          1 year ago Essential hypertension   [place], Elizabeth A, MD   2 years ago Essential hypertension   [place], Elizabeth A, MD   2 years ago Essential hypertension   [place], Elizabeth A, MD   3 years ago History of colonic polyps   [place], Elizabeth A, MD   3 years ago Essential hypertension   [place], Elizabeth A, MD      Future Appointments            In 3 weeks  [place] Real Cons, MD [place], [place]   In 9 months  [facility], [facility]              Requested Prescriptions  Pending Prescriptions Disp Refills   hydrochlorothiazide (HYDRODIURIL) 25 MG tablet 90 tablet 0    Sig: Take 1 tablet (25 mg total) by mouth daily. Needs office visit for further refills     Cardiovascular: Diuretics - Thiazide Failed - 11/27/2018 10:38 AM      Failed - Ca in normal range and within 360 days    Calcium  Date Value Ref Range Status  09/15/2017 9.3 8.4 - 10.5 mg/dL Final         Failed - Cr in normal range and within 360 days    Creatinine, Ser  Date Value Ref Range Status  09/15/2017 0.60 0.40 - 1.20 mg/dL Final         Failed - K in normal range and within 360 days    Potassium  Date Value Ref Range Status  09/15/2017 3.6 3.5 - 5.1  mEq/L Final         Failed - Na in normal range and within 360 days    Sodium  Date Value Ref Range Status  09/15/2017 138 135 - 145 mEq/L Final         Failed - Last BP in normal range    BP Readings from Last 1 Encounters:  09/15/17 (!) 142/92         Passed - Valid encounter within last 6 months    Recent Outpatient Visits          1 year ago Essential hypertension   [place], Elizabeth A, MD   2 years ago Essential hypertension   [place], Elizabeth A, MD   2 years ago Essential hypertension   [place], Elizabeth A, MD   3 years ago History of colonic polyps   [facility] -Chuck Hint, MD   3 years ago Essential hypertension   [place], Elizabeth A, MD      Future Appointments            In 3 weeks Sharlet Salina, Real Cons, MD [place], [place]   In 9 months  [place], [facility]

## 2018-11-27 NOTE — Telephone Encounter (Signed)
Medication Refill - Medication: hydrochlorothiazide (HYDRODIURIL) 25 MG tablet  Has the patient contacted their pharmacy? No. (Agent: If no, request that the patient contact the pharmacy for the refill.) (Agent: If yes, when and what did the pharmacy advise?)  Preferred Pharmacy (with phone number or street name):  [facility] ([place]), [place] - [address] [phone] (Phone) [phone] (Fax)     Agent: Please be advised that RX refills may take up to 3 business days. We ask that you follow-up with your pharmacy.

## 2018-12-07 ENCOUNTER — Telehealth: Payer: Self-pay

## 2018-12-07 NOTE — Telephone Encounter (Signed)
Covid-19 screening questions   Do you now or have you had a fever in the last 14 days? NO   Do you have any respiratory symptoms of shortness of breath or cough now or in the last 14 days? NO  Do you have any family members or close contacts with diagnosed or suspected Covid-19 in the past 14 days? NO  Have you been tested for Covid-19 and found to be positive? NO        

## 2018-12-08 ENCOUNTER — Encounter: Payer: Self-pay | Admitting: Gastroenterology

## 2018-12-08 ENCOUNTER — Other Ambulatory Visit: Payer: Self-pay

## 2018-12-08 ENCOUNTER — Ambulatory Visit (AMBULATORY_SURGERY_CENTER): Payer: Medicare Other | Admitting: Gastroenterology

## 2018-12-08 VITALS — BP 146/90 | HR 59 | Temp 98.8°F | Resp 13 | Ht 63.0 in | Wt 248.0 lb

## 2018-12-08 DIAGNOSIS — D123 Benign neoplasm of transverse colon: Secondary | ICD-10-CM

## 2018-12-08 DIAGNOSIS — K635 Polyp of colon: Secondary | ICD-10-CM

## 2018-12-08 DIAGNOSIS — D125 Benign neoplasm of sigmoid colon: Secondary | ICD-10-CM

## 2018-12-08 DIAGNOSIS — D120 Benign neoplasm of cecum: Secondary | ICD-10-CM

## 2018-12-08 DIAGNOSIS — D124 Benign neoplasm of descending colon: Secondary | ICD-10-CM

## 2018-12-08 DIAGNOSIS — D128 Benign neoplasm of rectum: Secondary | ICD-10-CM

## 2018-12-08 DIAGNOSIS — Z86010 Personal history of colonic polyps: Secondary | ICD-10-CM

## 2018-12-08 MED ORDER — SODIUM CHLORIDE 0.9 % IV SOLN: 500.0000 mL | Freq: Once | INTRAVENOUS | Status: DC

## 2018-12-08 NOTE — Progress Notes (Signed)
PT taken to PACU. Monitors in place. VSS. Report given to RN. 

## 2018-12-08 NOTE — Patient Instructions (Signed)
Handouts Provided: Polyps and High Fiber Diet  YOU HAD AN ENDOSCOPIC PROCEDURE TODAY AT [facility]:   Refer to the procedure report that was given to you for any specific questions about what was found during the examination.  If the procedure report does not answer your questions, please call your gastroenterologist to clarify.  If you requested that your care partner not be given the details of your procedure findings, then the procedure report has been included in a sealed envelope for you to review at your convenience later.  YOU SHOULD EXPECT: Some feelings of bloating in the abdomen. Passage of more gas than usual.  Walking can help get rid of the air that was put into your GI tract during the procedure and reduce the bloating. If you had a lower endoscopy (such as a colonoscopy or flexible sigmoidoscopy) you may notice spotting of blood in your stool or on the toilet paper. If you underwent a bowel prep for your procedure, you may not have a normal bowel movement for a few days.  Please Note:  You might notice some irritation and congestion in your nose or some drainage.  This is from the oxygen used during your procedure.  There is no need for concern and it should clear up in a day or so.  SYMPTOMS TO REPORT IMMEDIATELY:   Following lower endoscopy (colonoscopy or flexible sigmoidoscopy):  Excessive amounts of blood in the stool  Significant tenderness or worsening of abdominal pains  Swelling of the abdomen that is new, acute  Fever of 100F or higher  For urgent or emergent issues, a gastroenterologist can be reached at any hour by calling [phone].   DIET:  We do recommend a small meal at first, but then you may proceed to your regular diet.  Drink plenty of fluids but you should avoid alcoholic beverages for 24 hours.  ACTIVITY:  You should plan to take it easy for the rest of today and you should NOT DRIVE or use heavy machinery until tomorrow (because of  the sedation medicines used during the test).    FOLLOW UP: Our staff will call the number listed on your records 48-72 hours following your procedure to check on you and address any questions or concerns that you may have regarding the information given to you following your procedure. If we do not reach you, we will leave a message.  We will attempt to reach you two times.  During this call, we will ask if you have developed any symptoms of COVID 19. If you develop any symptoms (ie: fever, flu-like symptoms, shortness of breath, cough etc.) before then, please call [phone].  If you test positive for Covid 19 in the 2 weeks post procedure, please call and report this information to [place].    If any biopsies were taken you will be contacted by phone or by letter within the next 1-3 weeks.  Please call us at [phone] if you have not heard about the biopsies in 3 weeks.    SIGNATURES/CONFIDENTIALITY: You and/or your care partner have signed paperwork which will be entered into your electronic medical record.  These signatures attest to the fact that that the information above on your After Visit Summary has been reviewed and is understood.  Full responsibility of the confidentiality of this discharge information lies with you and/or your care-partner.

## 2018-12-08 NOTE — Op Note (Signed)
[facility] Patient Name: Carolyn Fowler Procedure Date: 12/08/2018 9:12 AM MRN: [id number] Endoscopist: Ladene Artist , MD Age: 73 Referring MD:  Date of Birth: 1945/08/20 Gender: Female Account #: [id number] Procedure:                Colonoscopy Indications:              Surveillance: Personal history of adenomatous                            polyps on last colonoscopy 3 years ago Medicines:                Monitored Anesthesia Care Procedure:                Pre-Anesthesia Assessment:                           - Prior to the procedure, a History and Physical                            was performed, and patient medications and                            allergies were reviewed. The patient's tolerance of                            previous anesthesia was also reviewed. The risks                            and benefits of the procedure and the sedation                            options and risks were discussed with the patient.                            All questions were answered, and informed consent                            was obtained. Prior Anticoagulants: The patient has                            taken no previous anticoagulant or antiplatelet                            agents. ASA Grade Assessment: III - A patient with                            severe systemic disease. After reviewing the risks                            and benefits, the patient was deemed in                            satisfactory condition to undergo the procedure.  After obtaining informed consent, the colonoscope                            was passed under direct vision. Throughout the                            procedure, the patient's blood pressure, pulse, and                            oxygen saturations were monitored continuously. The                            Colonoscope was introduced through the anus and                            advanced to the  the cecum, identified by                            appendiceal orifice and ileocecal valve. The                            ileocecal valve, appendiceal orifice, and rectum                            were photographed. The quality of the bowel                            preparation was good. The colonoscopy was performed                            without difficulty. The patient tolerated the                            procedure well. Scope In: 9:19:23 AM Scope Out: 9:39:37 AM Scope Withdrawal Time: 0 hours 17 minutes 11 seconds  Total Procedure Duration: 0 hours 20 minutes 14 seconds  Findings:                 The perianal and digital rectal examinations were                            normal.                           A 4 mm polyp was found in the cecum. The polyp was                            sessile. The polyp was removed with a cold biopsy                            forceps. Resection and retrieval were complete.                           Nine sessile polyps were found in the rectum (2),  sigmoid colon (1), descending colon (1) and                            transverse colon (5). The polyps were 5 to 9 mm in                            size. These polyps were removed with a cold snare.                            Resection and retrieval were complete.                           Multiple small-mouthed diverticula were found in                            the left colon. There was no evidence of                            diverticular bleeding.                           Internal hemorrhoids were found during                            retroflexion. The hemorrhoids were small and Grade                            I (internal hemorrhoids that do not prolapse).                           The exam was otherwise without abnormality on                            direct and retroflexion views. Complications:            No immediate complications. Estimated blood loss:                             None. Estimated Blood Loss:     Estimated blood loss: none. Impression:               - One 4 mm polyp in the cecum, removed with a cold                            biopsy forceps. Resected and retrieved.                           - Nine 5 to 9 mm polyps in the rectum, in the                            sigmoid colon, in the descending colon and in the                            transverse colon, removed with a cold snare.  Resected and retrieved.                           - Moderate diverticulosis in the left colon.                           - Internal hemorrhoids.                           - The examination was otherwise normal on direct                            and retroflexion views. Recommendation:           - Repeat colonoscopy in 2 - 3 years for                            surveillance pending pathology review.                           - Patient has a contact number available for                            emergencies. The signs and symptoms of potential                            delayed complications were discussed with the                            patient. Return to normal activities tomorrow.                            Written discharge instructions were provided to the                            patient.                           - High fiber diet.                           - Continue present medications.                           - Await pathology results. Ladene Artist, MD 12/08/2018 9:45:05 AM This report has been signed electronically.

## 2018-12-08 NOTE — Progress Notes (Signed)
Pt's states no medical or surgical changes since previsit or office visit.Pt's states no medical or surgical changes since previsit or office visit.Pt's states no medical or surgical changes since previsit or office visit VS [facility]

## 2018-12-12 ENCOUNTER — Encounter: Payer: Self-pay | Admitting: Gastroenterology

## 2018-12-12 ENCOUNTER — Telehealth: Payer: Self-pay

## 2018-12-12 NOTE — Telephone Encounter (Signed)
  Follow up Call-  Call back number 12/08/2018  Post procedure Call Back phone  # [phone]  Permission to leave phone message No  Some recent data might be hidden     Patient questions:  Do you have a fever, pain , or abdominal swelling? No. Pain Score  0 *  Have you tolerated food without any problems? Yes.    Have you been able to return to your normal activities? Yes.    Do you have any questions about your discharge instructions: Diet   No. Medications  No. Follow up visit  No.  Do you have questions or concerns about your Care? No.  Actions: * If pain score is 4 or above: No action needed, pain <4.  1. Have you developed a fever since your procedure? no  2.   Have you had an respiratory symptoms (SOB or cough) since your procedure? no  3.   Have you tested positive for COVID 19 since your procedure no  4.   Have you had any family members/close contacts diagnosed with the COVID 19 since your procedure?  no   If yes to any of these questions please route to Joylene John, RN and Alphonsa Gin, Therapist, sports.

## 2018-12-12 NOTE — Telephone Encounter (Signed)
No answer, patient requested no message be left, will call back later today, B. RN. 

## 2018-12-13 ENCOUNTER — Telehealth: Payer: Self-pay | Admitting: Internal Medicine

## 2018-12-13 NOTE — Telephone Encounter (Signed)
She can move appointment up and we can address for her. Would need to assess for medications.

## 2018-12-13 NOTE — Telephone Encounter (Signed)
OV scheduled 12/14/18. COVID screened. Check in process given.

## 2018-12-13 NOTE — Telephone Encounter (Signed)
Patient called and would like to talk to someone about boil near her breast for 3 days and wanted to know what she can do and if Dr. Sharlet Salina can call something in for mer to her [place] ([place]), [place] - [address] Please call patient back.

## 2018-12-14 ENCOUNTER — Other Ambulatory Visit (INDEPENDENT_AMBULATORY_CARE_PROVIDER_SITE_OTHER): Payer: Medicare Other

## 2018-12-14 ENCOUNTER — Ambulatory Visit (INDEPENDENT_AMBULATORY_CARE_PROVIDER_SITE_OTHER): Payer: Medicare Other | Admitting: Internal Medicine

## 2018-12-14 ENCOUNTER — Encounter: Payer: Self-pay | Admitting: Internal Medicine

## 2018-12-14 ENCOUNTER — Other Ambulatory Visit: Payer: Self-pay

## 2018-12-14 VITALS — BP 120/90 | HR 76 | Temp 99.5°F | Ht 63.0 in | Wt 247.0 lb

## 2018-12-14 DIAGNOSIS — Z0000 Encounter for general adult medical examination without abnormal findings: Secondary | ICD-10-CM

## 2018-12-14 DIAGNOSIS — Z23 Encounter for immunization: Secondary | ICD-10-CM

## 2018-12-14 DIAGNOSIS — I10 Essential (primary) hypertension: Secondary | ICD-10-CM

## 2018-12-14 DIAGNOSIS — R21 Rash and other nonspecific skin eruption: Secondary | ICD-10-CM

## 2018-12-14 DIAGNOSIS — K219 Gastro-esophageal reflux disease without esophagitis: Secondary | ICD-10-CM | POA: Diagnosis not present

## 2018-12-14 DIAGNOSIS — L0291 Cutaneous abscess, unspecified: Secondary | ICD-10-CM | POA: Diagnosis not present

## 2018-12-14 LAB — HEMOGLOBIN A1C: Hgb A1c MFr Bld: 5.9 % (ref 4.6–6.5)

## 2018-12-14 LAB — LIPID PANEL
Cholesterol: 179 mg/dL (ref 0–200)
HDL: 53.2 mg/dL (ref >39.00)
LDL Cholesterol: 109 mg/dL — ABNORMAL HIGH (ref 0–99)
NonHDL: 126.22
Total CHOL/HDL Ratio: 3
Triglycerides: 88 mg/dL (ref 0.0–149.0)
VLDL: 17.6 mg/dL (ref 0.0–40.0)

## 2018-12-14 LAB — COMPREHENSIVE METABOLIC PANEL
ALT: 15 U/L (ref 0–35)
AST: 13 U/L (ref 0–37)
Albumin: 4.2 g/dL (ref 3.5–5.2)
Alkaline Phosphatase: 73 U/L (ref 39–117)
BUN: 12 mg/dL (ref 6–23)
CO2: 29 mEq/L (ref 19–32)
Calcium: 10 mg/dL (ref 8.4–10.5)
Chloride: 101 mEq/L (ref 96–112)
Creatinine, Ser: 0.6 mg/dL (ref 0.40–1.20)
GFR: 118.82 mL/min (ref >60.00)
Glucose, Bld: 95 mg/dL (ref 70–99)
Potassium: 3.7 mEq/L (ref 3.5–5.1)
Sodium: 139 mEq/L (ref 135–145)
Total Bilirubin: 0.5 mg/dL (ref 0.2–1.2)
Total Protein: 7.2 g/dL (ref 6.0–8.3)

## 2018-12-14 LAB — CBC
HCT: 43.1 % (ref 36.0–46.0)
Hemoglobin: 13.8 g/dL (ref 12.0–15.0)
MCHC: 32.1 g/dL (ref 30.0–36.0)
MCV: 74 fl — ABNORMAL LOW (ref 78.0–100.0)
Platelets: 231 10*3/uL (ref 150.0–400.0)
RBC: 5.82 Mil/uL — ABNORMAL HIGH (ref 3.87–5.11)
RDW: 15.4 % (ref 11.5–15.5)
WBC: 7.6 10*3/uL (ref 4.0–10.5)

## 2018-12-14 MED ORDER — SULFAMETHOXAZOLE-TRIMETHOPRIM 800-160 MG PO TABS: 1.0000 | ORAL_TABLET | Freq: Two times a day (BID) | ORAL | 0 refills | Status: DC

## 2018-12-14 MED ORDER — NYSTATIN-TRIAMCINOLONE 100000-0.1 UNIT/GM-% EX OINT: 1.0000 "application " | TOPICAL_OINTMENT | Freq: Two times a day (BID) | CUTANEOUS | 3 refills | Status: DC

## 2018-12-14 NOTE — Assessment & Plan Note (Signed)
Not amenable to I and D today, rx bactrim 5 day course.

## 2018-12-14 NOTE — Assessment & Plan Note (Signed)
BP at goal on hctz and metoprolol. Checking CMP and adjust as needed.  

## 2018-12-14 NOTE — Patient Instructions (Addendum)
Think about getting the shingles vaccine in the future.  We have sent in the bactrim for the cyst on the breast and the cream to use on the rash.   Health Maintenance, Female Adopting a healthy lifestyle and getting preventive care are important in promoting health and wellness. Ask your health care provider about:  The right schedule for you to have regular tests and exams.  Things you can do on your own to prevent diseases and keep yourself healthy. What should I know about diet, weight, and exercise? Eat a healthy diet   Eat a diet that includes plenty of vegetables, fruits, low-fat dairy products, and lean protein.  Do not eat a lot of foods that are high in solid fats, added sugars, or sodium. Maintain a healthy weight Body mass index (BMI) is used to identify weight problems. It estimates body fat based on height and weight. Your health care provider can help determine your BMI and help you achieve or maintain a healthy weight. Get regular exercise Get regular exercise. This is one of the most important things you can do for your health. Most adults should:  Exercise for at least 150 minutes each week. The exercise should increase your heart rate and make you sweat (moderate-intensity exercise).  Do strengthening exercises at least twice a week. This is in addition to the moderate-intensity exercise.  Spend less time sitting. Even light physical activity can be beneficial. Watch cholesterol and blood lipids Have your blood tested for lipids and cholesterol at 72 years of age, then have this test every 5 years. Have your cholesterol levels checked more often if:  Your lipid or cholesterol levels are high.  You are older than 72 years of age.  You are at high risk for heart disease. What should I know about cancer screening? Depending on your health history and family history, you may need to have cancer screening at various ages. This may include screening for:  Breast  cancer.  Cervical cancer.  Colorectal cancer.  Skin cancer.  Lung cancer. What should I know about heart disease, diabetes, and high blood pressure? Blood pressure and heart disease  High blood pressure causes heart disease and increases the risk of stroke. This is more likely to develop in people who have high blood pressure readings, are of African descent, or are overweight.  Have your blood pressure checked: ? Every 3-5 years if you are 22-71 years of age. ? Every year if you are 5 years old or older. Diabetes Have regular diabetes screenings. This checks your fasting blood sugar level. Have the screening done:  Once every three years after age 78 if you are at a normal weight and have a low risk for diabetes.  More often and at a younger age if you are overweight or have a high risk for diabetes. What should I know about preventing infection? Hepatitis B If you have a higher risk for hepatitis B, you should be screened for this virus. Talk with your health care provider to find out if you are at risk for hepatitis B infection. Hepatitis C Testing is recommended for:  Everyone born from [birth year] through 1964.  Anyone with known risk factors for hepatitis C. Sexually transmitted infections (STIs)  Get screened for STIs, including gonorrhea and chlamydia, if: ? You are sexually active and are younger than 72 years of age. ? You are older than 72 years of age and your health care provider tells you that you are at risk  for this type of infection. ? Your sexual activity has changed since you were last screened, and you are at increased risk for chlamydia or gonorrhea. Ask your health care provider if you are at risk.  Ask your health care provider about whether you are at high risk for HIV. Your health care provider may recommend a prescription medicine to help prevent HIV infection. If you choose to take medicine to prevent HIV, you should first get tested for HIV. You should  then be tested every 3 months for as long as you are taking the medicine. Pregnancy  If you are about to stop having your period (premenopausal) and you may become pregnant, seek counseling before you get pregnant.  Take 400 to 800 micrograms (mcg) of folic acid every day if you become pregnant.  Ask for birth control (contraception) if you want to prevent pregnancy. Osteoporosis and menopause Osteoporosis is a disease in which the bones lose minerals and strength with aging. This can result in bone fractures. If you are 65 years old or older, or if you are at risk for osteoporosis and fractures, ask your health care provider if you should:  Be screened for bone loss.  Take a calcium or vitamin D supplement to lower your risk of fractures.  Be given hormone replacement therapy (HRT) to treat symptoms of menopause. Follow these instructions at home: Lifestyle  Do not use any products that contain nicotine or tobacco, such as cigarettes, e-cigarettes, and chewing tobacco. If you need help quitting, ask your health care provider.  Do not use street drugs.  Do not share needles.  Ask your health care provider for help if you need support or information about quitting drugs. Alcohol use  Do not drink alcohol if: ? Your health care provider tells you not to drink. ? You are pregnant, may be pregnant, or are planning to become pregnant.  If you drink alcohol: ? Limit how much you use to 0-1 drink a day. ? Limit intake if you are breastfeeding.  Be aware of how much alcohol is in your drink. In the [place] one drink equals one 12 oz bottle of beer (355 mL), one 5 oz glass of wine (148 mL), or one 1 oz glass of hard liquor (44 mL). General instructions  Schedule regular health, dental, and eye exams.  Stay current with your vaccines.  Tell your health care provider if: ? You often feel depressed. ? You have ever been abused or do not feel safe at home. Summary  Adopting a  healthy lifestyle and getting preventive care are important in promoting health and wellness.  Follow your health care provider's instructions about healthy diet, exercising, and getting tested or screened for diseases.  Follow your health care provider's instructions on monitoring your cholesterol and blood pressure. This information is not intended to replace advice given to you by your health care provider. Make sure you discuss any questions you have with your health care provider. Document Released: 09/07/2010 Document Revised: 02/15/2018 Document Reviewed: 02/15/2018 Elsevier Patient Education  2020 [employer].

## 2018-12-14 NOTE — Assessment & Plan Note (Signed)
Weight stable, counseled about diet and exercise today.

## 2018-12-14 NOTE — Assessment & Plan Note (Signed)
Rx nystatin/triamcinolone ointment for fungal skin infection around the breasts

## 2018-12-14 NOTE — Addendum Note (Signed)
Addended by: Pollyann Glen on: 12/14/2018 02:01 PM   Modules accepted: Orders

## 2018-12-14 NOTE — Progress Notes (Signed)
   Subjective:   Patient ID: Carolyn Fowler, female    DOB: May 02, 1945, 73 y.o.   MRN: [id number]  HPI The patient is a 73 YO female coming in for physical.   PMH, [facility], social history reviewed and updated  Has acute complaints: HPI #2 here for pain and swelling cyst near breast (started Monday and growing significantly since then, sore to touch and throbbing 4/10, has not taken anything for it, using heat on it which did not help, denies fevers or chills, denies drainage from it) and rash on breasts (from moisture, she gets from time to time, is a little worse recently than usual, uses otc cream which does not help, denies fevers or chills).   Review of Systems  Constitutional: Negative.   HENT: Negative.   Eyes: Negative.   Respiratory: Negative for cough, chest tightness and shortness of breath.   Cardiovascular: Negative for chest pain, palpitations and leg swelling.  Gastrointestinal: Negative for abdominal distention, abdominal pain, constipation, diarrhea, nausea and vomiting.  Musculoskeletal: Positive for myalgias.  Skin: Positive for rash.  Neurological: Negative.   Psychiatric/Behavioral: Negative.     Objective:  Physical Exam Constitutional:      Appearance: She is well-developed. She is obese.  HENT:     Head: Normocephalic and atraumatic.  Neck:     Musculoskeletal: Normal range of motion.  Cardiovascular:     Rate and Rhythm: Normal rate and regular rhythm.  Pulmonary:     Effort: Pulmonary effort is normal. No respiratory distress.     Breath sounds: Normal breath sounds. No wheezing or rales.  Abdominal:     General: Bowel sounds are normal. There is no distension.     Palpations: Abdomen is soft.     Tenderness: There is no abdominal tenderness. There is no rebound.  Skin:    General: Skin is warm and dry.     Findings: Rash present.     Comments: Some fungal rash around the breasts, a hard abscess mid-breastbone without fluctuance or drainage, no  surrounding cellulitis  Neurological:     Mental Status: She is alert and oriented to person, place, and time.     Coordination: Coordination normal.     Vitals:   12/14/18 1318  BP: 120/90  Pulse: 76  Temp: 99.5 F (37.5 C)  TempSrc: Oral  SpO2: 97%  Weight: 247 lb (112 kg)  Height: 5\' 3"  (1.6 m)    Assessment & Plan:  Flu shot given at visit

## 2018-12-14 NOTE — Assessment & Plan Note (Signed)
Flu shot given. Pneumonia complete. Shingrix counseled declines. Tetanus due 2021. Colonoscopy due 2025. Mammogram due 2021, pap smear aged out and dexa declines further. Counseled about sun safety and mole surveillance. Counseled about the dangers of distracted driving. Given 10 year screening recommendations.

## 2018-12-14 NOTE — Assessment & Plan Note (Signed)
Stable on pepcid

## 2018-12-19 ENCOUNTER — Telehealth: Payer: Self-pay

## 2018-12-19 MED ORDER — SULFAMETHOXAZOLE-TRIMETHOPRIM 800-160 MG PO TABS: 1.0000 | ORAL_TABLET | Freq: Two times a day (BID) | ORAL | 0 refills | Status: DC

## 2018-12-19 NOTE — Addendum Note (Signed)
Addended by: Pricilla Holm A on: 12/19/2018 09:49 AM   Modules accepted: Orders

## 2018-12-19 NOTE — Telephone Encounter (Signed)
Patient informed. 

## 2018-12-19 NOTE — Telephone Encounter (Signed)
This depends, is it increasing or decreasing in size? Pain? Redness of skin?

## 2018-12-19 NOTE — Telephone Encounter (Signed)
Copied from [place] [id number]. Topic: General - Other >> Dec 19, 2018  8:54 AM Pauline Good wrote: Reason for CRM: pt stated she still has the boil on her chest and want to know if you can call in for some more antibiotics or does she need to come in for  visit. Please call pt to advise

## 2018-12-19 NOTE — Telephone Encounter (Signed)
Decreases in size but is still red and sore, states half the size of it was but still feels hardness.

## 2018-12-19 NOTE — Telephone Encounter (Signed)
Sent in an additional week of antibiotics to help this further reduce.

## 2018-12-22 ENCOUNTER — Encounter: Payer: Medicare Other | Admitting: Internal Medicine

## 2018-12-26 NOTE — Telephone Encounter (Signed)
Patient is going to give it a few more days before she schedules.

## 2018-12-26 NOTE — Telephone Encounter (Signed)
Pt states the boil is still there, and a hard core about size of a dime. It came to a head, and It is draining. But she has finished abx today.   Wants to know if you want her to continue some more meds?  Also, wants to know if you might want to lance the area? It is just a small bump that is draining.

## 2018-12-26 NOTE — Telephone Encounter (Signed)
She can schedule if she wants it assessed for drainage. If draining on its own typically more antibiotics are not required.

## 2018-12-26 NOTE — Telephone Encounter (Signed)
Noted  

## 2018-12-26 NOTE — Telephone Encounter (Signed)
Can you make patient an appointment to assess the drainage

## 2019-01-03 ENCOUNTER — Ambulatory Visit (INDEPENDENT_AMBULATORY_CARE_PROVIDER_SITE_OTHER): Payer: Medicare Other | Admitting: Internal Medicine

## 2019-01-03 ENCOUNTER — Other Ambulatory Visit: Payer: Self-pay

## 2019-01-03 ENCOUNTER — Encounter: Payer: Self-pay | Admitting: Internal Medicine

## 2019-01-03 VITALS — BP 152/100 | HR 69 | Temp 99.1°F | Ht 63.0 in | Wt 250.0 lb

## 2019-01-03 DIAGNOSIS — I10 Essential (primary) hypertension: Secondary | ICD-10-CM

## 2019-01-03 DIAGNOSIS — L748 Other eccrine sweat disorders: Secondary | ICD-10-CM | POA: Diagnosis not present

## 2019-01-03 NOTE — Progress Notes (Signed)
   Subjective:   Patient ID: Carolyn Fowler, female    DOB: 01/21/1946, 73 y.o.   MRN: [id number]  HPI The patient is a 73 YO female coming in for follow up of a cyst. This was treated with 12 days total of bactrim a couple of weeks ago. She did have noticeable decrease in the redness. It is no longer tender. She did have a head area which drained out some stuff but this is closed now. She denies that this is growing in size. Started treating this initially 3 weeks ago. She does still have a hard area and is wondering if anything needs to be done with this.   Review of Systems  Constitutional: Negative.   HENT: Negative.   Eyes: Negative.   Respiratory: Negative for cough, chest tightness and shortness of breath.   Cardiovascular: Negative for chest pain, palpitations and leg swelling.  Gastrointestinal: Negative for abdominal distention, abdominal pain, constipation, diarrhea, nausea and vomiting.  Musculoskeletal: Negative.   Skin: Negative.        lump  Neurological: Negative.   Psychiatric/Behavioral: Negative.     Objective:  Physical Exam Constitutional:      Appearance: She is well-developed. She is obese.  HENT:     Head: Normocephalic and atraumatic.  Neck:     Musculoskeletal: Normal range of motion.  Cardiovascular:     Rate and Rhythm: Normal rate and regular rhythm.     Comments: 1-2 mm cyst circular at the apex between the breasts without tenderness, no redness of the skin. No drainage expressible and no fluctuance.  Pulmonary:     Effort: Pulmonary effort is normal. No respiratory distress.     Breath sounds: Normal breath sounds. No wheezing or rales.  Abdominal:     General: Bowel sounds are normal. There is no distension.     Palpations: Abdomen is soft.     Tenderness: There is no abdominal tenderness. There is no rebound.  Skin:    General: Skin is warm and dry.  Neurological:     Mental Status: She is alert and oriented to person, place, and time.      Coordination: Coordination normal.     Vitals:   01/03/19 0840  BP: (!) 152/100  Pulse: 69  Temp: 99.1 F (37.3 C)  TempSrc: Oral  SpO2: 97%  Weight: 250 lb (113.4 kg)  Height: 5\' 3"  (1.6 m)    Assessment & Plan:

## 2019-01-03 NOTE — Patient Instructions (Signed)
You do not need to get that taken off.

## 2019-01-03 NOTE — Assessment & Plan Note (Signed)
Has not taken meds this morning.

## 2019-01-03 NOTE — Assessment & Plan Note (Signed)
This does not need to be surgically removed but we talked about this as an option if she wants to avoid the potential for future infection. We will have her wait another several weeks to month to see if this will continue to reduce in size.

## 2019-02-16 ENCOUNTER — Other Ambulatory Visit: Payer: Self-pay | Admitting: Internal Medicine

## 2019-03-22 DIAGNOSIS — H11002 Unspecified pterygium of left eye: Secondary | ICD-10-CM | POA: Diagnosis not present

## 2019-03-22 DIAGNOSIS — H25813 Combined forms of age-related cataract, bilateral: Secondary | ICD-10-CM | POA: Diagnosis not present

## 2019-03-22 DIAGNOSIS — H5213 Myopia, bilateral: Secondary | ICD-10-CM | POA: Diagnosis not present

## 2019-03-22 DIAGNOSIS — H524 Presbyopia: Secondary | ICD-10-CM | POA: Diagnosis not present

## 2019-03-30 ENCOUNTER — Telehealth: Payer: Self-pay

## 2019-03-30 NOTE — Telephone Encounter (Signed)
New message    The patient call wanted to know with her allergies should she take the covid vaccine.

## 2019-04-02 NOTE — Telephone Encounter (Signed)
There are no eggs in the covid-19 vaccine so her allergy is not related to this vaccine.

## 2019-04-30 ENCOUNTER — Ambulatory Visit: Payer: Medicare PPO | Attending: Family

## 2019-04-30 DIAGNOSIS — Z23 Encounter for immunization: Secondary | ICD-10-CM

## 2019-04-30 NOTE — Progress Notes (Signed)
   Covid-19 Vaccination Clinic  Name:  BO WADDINGTON    MRN: [id number] DOB: 27-Nov-1945  04/30/2019  Ms. Larimer was observed post Covid-19 immunization for 30 minutes based on pre-vaccination screening without incidence. She was provided with Vaccine Information Sheet and instruction to access the V-Safe system.   Ms. Bratten was instructed to call 911 with any severe reactions post vaccine: Marland Kitchen Difficulty breathing  . Swelling of your face and throat  . A fast heartbeat  . A bad rash all over your body  . Dizziness and weakness    Immunizations Administered    Name Date Dose VIS Date Route   Moderna COVID-19 Vaccine 04/30/2019 12:16 PM 0.5 mL 02/06/2019 Intramuscular   Manufacturer: Moderna   Lot: [id number]   [id number]

## 2019-05-01 ENCOUNTER — Ambulatory Visit: Payer: Medicare PPO | Attending: Internal Medicine

## 2019-05-01 DIAGNOSIS — Z23 Encounter for immunization: Secondary | ICD-10-CM

## 2019-05-01 DIAGNOSIS — Z20822 Contact with and (suspected) exposure to covid-19: Secondary | ICD-10-CM

## 2019-05-02 LAB — NOVEL CORONAVIRUS, NAA: SARS-CoV-2, NAA: NOT DETECTED

## 2019-05-29 ENCOUNTER — Ambulatory Visit: Payer: Medicare PPO | Attending: Family

## 2019-05-29 DIAGNOSIS — Z23 Encounter for immunization: Secondary | ICD-10-CM

## 2019-05-29 NOTE — Progress Notes (Signed)
   Covid-19 Vaccination Clinic  Name:  Carolyn Fowler    MRN: [id number] DOB: 31-Oct-1945  05/29/2019  [name] was observed post Covid-19 immunization for 15 minutes without incident. She was provided with Vaccine Information Sheet and instruction to access the V-Safe system.   [name] was instructed to call 911 with any severe reactions post vaccine: Marland Kitchen Difficulty breathing  . Swelling of face and throat  . A fast heartbeat  . A bad rash all over body  . Dizziness and weakness   Immunizations Administered    Name Date Dose VIS Date Route   Moderna COVID-19 Vaccine 05/29/2019  3:39 PM 0.5 mL 02/06/2019 Intramuscular   Manufacturer: Moderna   LotHQ:[id number]   [id number]

## 2019-08-24 ENCOUNTER — Other Ambulatory Visit: Payer: Self-pay | Admitting: Internal Medicine

## 2019-08-26 ENCOUNTER — Other Ambulatory Visit: Payer: Self-pay | Admitting: Internal Medicine

## 2019-08-28 ENCOUNTER — Ambulatory Visit: Payer: Medicare PPO

## 2019-09-03 DIAGNOSIS — H25813 Combined forms of age-related cataract, bilateral: Secondary | ICD-10-CM | POA: Diagnosis not present

## 2019-09-19 DIAGNOSIS — H25811 Combined forms of age-related cataract, right eye: Secondary | ICD-10-CM | POA: Diagnosis not present

## 2019-09-19 DIAGNOSIS — H25012 Cortical age-related cataract, left eye: Secondary | ICD-10-CM | POA: Diagnosis not present

## 2019-09-19 DIAGNOSIS — H2512 Age-related nuclear cataract, left eye: Secondary | ICD-10-CM | POA: Diagnosis not present

## 2019-09-24 ENCOUNTER — Ambulatory Visit (INDEPENDENT_AMBULATORY_CARE_PROVIDER_SITE_OTHER): Payer: Medicare PPO

## 2019-09-24 ENCOUNTER — Other Ambulatory Visit: Payer: Self-pay

## 2019-09-24 VITALS — BP 130/80 | HR 68 | Temp 98.2°F | Resp 16 | Ht 63.0 in | Wt 254.4 lb

## 2019-09-24 DIAGNOSIS — Z0000 Encounter for general adult medical examination without abnormal findings: Secondary | ICD-10-CM | POA: Diagnosis not present

## 2019-09-24 NOTE — Patient Instructions (Signed)
Carolyn Fowler , Thank you for taking time to come for your Medicare Wellness Visit. I appreciate your ongoing commitment to your health goals. Please review the following plan we discussed and let me know if I can assist you in the future.   Screening recommendations/referrals: Colonoscopy: 12/08/2018; due every 5 years Mammogram: 08/29/2017; due every 2 years Bone Density: 07/29/2014 Recommended yearly ophthalmology/optometry visit for glaucoma screening and checkup Recommended yearly dental visit for hygiene and checkup  Vaccinations: Influenza vaccine: 12/14/2018 Pneumococcal vaccine: completed Tdap vaccine: 09/23/2009; due every 10 years Shingles vaccine: never done Covid-19: completed  Advanced directives: Please bring a copy of your health care power of attorney and living will to the office at your convenience.   Conditions/risks identified: Yes; Please continue to do your personal lifestyle choices by: daily care of teeth and gums, regular physical activity (goal should be 5 days a week for 30 minutes), eat a healthy diet, avoid tobacco and drug use, limiting any alcohol intake, taking a low-dose aspirin (if not allergic or have been advised by your provider otherwise) and taking vitamins and minerals as recommended by your provider. Continue doing brain stimulating activities (puzzles, reading, adult coloring books, staying active) to keep memory sharp. Continue to eat heart healthy diet (full of fruits, vegetables, whole grains, lean protein, water--limit salt, fat, and sugar intake) and increase physical activity as tolerated.  Next appointment: Please schedule your next Medicare Wellness Visit with your Nurse Health Advisor in 1 year.  Preventive Care 52 Years and Older, Female Preventive care refers to lifestyle choices and visits with your health care provider that can promote health and wellness. What does preventive care include?  A yearly physical exam. This is also called  an annual well check.  Dental exams once or twice a year.  Routine eye exams. Ask your health care provider how often you should have your eyes checked.  Personal lifestyle choices, including:  Daily care of your teeth and gums.  Regular physical activity.  Eating a healthy diet.  Avoiding tobacco and drug use.  Limiting alcohol use.  Practicing safe sex.  Taking low-dose aspirin every day.  Taking vitamin and mineral supplements as recommended by your health care provider. What happens during an annual well check? The services and screenings done by your health care provider during your annual well check will depend on your age, overall health, lifestyle risk factors, and family history of disease. Counseling  Your health care provider may ask you questions about your:  Alcohol use.  Tobacco use.  Drug use.  Emotional well-being.  Home and relationship well-being.  Sexual activity.  Eating habits.  History of falls.  Memory and ability to understand (cognition).  Work and work Statistician.  Reproductive health. Screening  You may have the following tests or measurements:  Height, weight, and BMI.  Blood pressure.  Lipid and cholesterol levels. These may be checked every 5 years, or more frequently if you are over 17 years old.  Skin check.  Lung cancer screening. You may have this screening every year starting at age 56 if you have a 30-pack-year history of smoking and currently smoke or have quit within the past 15 years.  Fecal occult blood test (FOBT) of the stool. You may have this test every year starting at age 24.  Flexible sigmoidoscopy or colonoscopy. You may have a sigmoidoscopy every 5 years or a colonoscopy every 10 years starting at age 83.  Hepatitis C blood test.  Hepatitis B blood  test.  Sexually transmitted disease (STD) testing.  Diabetes screening. This is done by checking your blood sugar (glucose) after you have not eaten  for a while (fasting). You may have this done every 1-3 years.  Bone density scan. This is done to screen for osteoporosis. You may have this done starting at age 58.  Mammogram. This may be done every 1-2 years. Talk to your health care provider about how often you should have regular mammograms. Talk with your health care provider about your test results, treatment options, and if necessary, the need for more tests. Vaccines  Your health care provider may recommend certain vaccines, such as:  Influenza vaccine. This is recommended every year.  Tetanus, diphtheria, and acellular pertussis (Tdap, Td) vaccine. You may need a Td booster every 10 years.  Zoster vaccine. You may need this after age 70.  Pneumococcal 13-valent conjugate (PCV13) vaccine. One dose is recommended after age 52.  Pneumococcal polysaccharide (PPSV23) vaccine. One dose is recommended after age 48. Talk to your health care provider about which screenings and vaccines you need and how often you need them. This information is not intended to replace advice given to you by your health care provider. Make sure you discuss any questions you have with your health care provider. Document Released: 03/21/2015 Document Revised: 11/12/2015 Document Reviewed: 12/24/2014 Elsevier Interactive Patient Education  2017 [employer] Prevention in the Home Falls can cause injuries. They can happen to people of all ages. There are many things you can do to make your home safe and to help prevent falls. What can I do on the outside of my home?  Regularly fix the edges of walkways and driveways and fix any cracks.  Remove anything that might make you trip as you walk through a door, such as a raised step or threshold.  Trim any bushes or trees on the path to your home.  Use bright outdoor lighting.  Clear any walking paths of anything that might make someone trip, such as rocks or tools.  Regularly check to see if  handrails are loose or broken. Make sure that both sides of any steps have handrails.  Any raised decks and porches should have guardrails on the edges.  Have any leaves, snow, or ice cleared regularly.  Use sand or salt on walking paths during winter.  Clean up any spills in your garage right away. This includes oil or grease spills. What can I do in the bathroom?  Use night lights.  Install grab bars by the toilet and in the tub and shower. Do not use towel bars as grab bars.  Use non-skid mats or decals in the tub or shower.  If you need to sit down in the shower, use a plastic, non-slip stool.  Keep the floor dry. Clean up any water that spills on the floor as soon as it happens.  Remove soap buildup in the tub or shower regularly.  Attach bath mats securely with double-sided non-slip rug tape.  Do not have throw rugs and other things on the floor that can make you trip. What can I do in the bedroom?  Use night lights.  Make sure that you have a light by your bed that is easy to reach.  Do not use any sheets or blankets that are too big for your bed. They should not hang down onto the floor.  Have a firm chair that has side arms. You can use this for support while  you get dressed.  Do not have throw rugs and other things on the floor that can make you trip. What can I do in the kitchen?  Clean up any spills right away.  Avoid walking on wet floors.  Keep items that you use a lot in easy-to-reach places.  If you need to reach something above you, use a strong step stool that has a grab bar.  Keep electrical cords out of the way.  Do not use floor polish or wax that makes floors slippery. If you must use wax, use non-skid floor wax.  Do not have throw rugs and other things on the floor that can make you trip. What can I do with my stairs?  Do not leave any items on the stairs.  Make sure that there are handrails on both sides of the stairs and use them. Fix  handrails that are broken or loose. Make sure that handrails are as long as the stairways.  Check any carpeting to make sure that it is firmly attached to the stairs. Fix any carpet that is loose or worn.  Avoid having throw rugs at the top or bottom of the stairs. If you do have throw rugs, attach them to the floor with carpet tape.  Make sure that you have a light switch at the top of the stairs and the bottom of the stairs. If you do not have them, ask someone to add them for you. What else can I do to help prevent falls?  Wear shoes that:  Do not have high heels.  Have rubber bottoms.  Are comfortable and fit you well.  Are closed at the toe. Do not wear sandals.  If you use a stepladder:  Make sure that it is fully opened. Do not climb a closed stepladder.  Make sure that both sides of the stepladder are locked into place.  Ask someone to hold it for you, if possible.  Clearly mark and make sure that you can see:  Any grab bars or handrails.  First and last steps.  Where the edge of each step is.  Use tools that help you move around (mobility aids) if they are needed. These include:  Canes.  Walkers.  Scooters.  Crutches.  Turn on the lights when you go into a dark area. Replace any light bulbs as soon as they burn out.  Set up your furniture so you have a clear path. Avoid moving your furniture around.  If any of your floors are uneven, fix them.  If there are any pets around you, be aware of where they are.  Review your medicines with your doctor. Some medicines can make you feel dizzy. This can increase your chance of falling. Ask your doctor what other things that you can do to help prevent falls. This information is not intended to replace advice given to you by your health care provider. Make sure you discuss any questions you have with your health care provider. Document Released: 12/19/2008 Document Revised: 07/31/2015 Document Reviewed:  03/29/2014 Elsevier Interactive Patient Education  2017 [employer].

## 2019-09-24 NOTE — Progress Notes (Signed)
Subjective:   Carolyn Fowler is a 74 y.o. female who presents for Medicare Annual (Subsequent) preventive examination.  Review of Systems    No ROS. Medicare Wellness Visit Cardiac Risk Factors include: advanced age (>65men, >44 women);family history of premature cardiovascular disease;hypertension;obesity (BMI >30kg/m2)     Objective:    Today's Vitals   09/24/19 0951  BP: 130/80  Pulse: 68  Resp: 16  Temp: 98.2 F (36.8 C)  SpO2: 97%  Weight: 254 lb 6.4 oz (115.4 kg)  Height: 5\' 3"  (1.6 m)  PainSc: 0-No pain   Body mass index is 45.06 kg/m.  Advanced Directives 09/24/2019 08/24/2018 08/02/2017 07/26/2016 04/25/2015 04/11/2015 10/27/2011  Does Patient Have a Medical Advance Directive? Yes No Yes Yes No Yes Patient does not have advance directive  Type of Advance Directive [place];Living will - [place];Living will [place];Living will - [place];Living will -  Does patient want to make changes to medical advance directive? No - Patient declined Yes (MAU/Ambulatory/Procedural Areas - Information given) - - - No - Patient declined -  Copy of [place] in Chart? No - copy requested - No - copy requested No - copy requested - No - copy requested -  Pre-existing out of facility DNR order (yellow form or pink MOST form) - - - - - - No    Current Medications (verified) Outpatient Encounter Medications as of 09/24/2019  Medication Sig  . naproxen sodium (ALEVE) 220 MG tablet Take 220 mg by mouth as needed.  Marland Kitchen acetaminophen (TYLENOL) 500 MG tablet Take by mouth.  Marland Kitchen aspirin 81 MG tablet Take 81 mg by mouth daily.  . famotidine (PEPCID) 20 MG tablet Take 1 tablet by mouth twice daily  . fluticasone (FLONASE) 50 MCG/ACT nasal spray Place 2 sprays into both nostrils daily.  . hydrochlorothiazide (HYDRODIURIL) 25 MG tablet TAKE 1 TABLET BY MOUTH ONCE DAILY . APPOINTMENT REQUIRED FOR  FUTURE REFILLS  . metoprolol tartrate (LOPRESSOR) 50 MG tablet Take 1 tablet by mouth twice daily  . Multiple Vitamin (MULTIVITAMIN WITH MINERALS) TABS Take 1 tablet by mouth daily.  Marland Kitchen nystatin-triamcinolone ointment (MYCOLOG) Apply 1 application topically 2 (two) times daily.  . Sennosides (SENOKOT PO) Take by mouth as needed.    No facility-administered encounter medications on file as of 09/24/2019.    Allergies (verified) Penicillins, Eggs or egg-derived products, and Tetracyclines & related   History: Past Medical History:  Diagnosis Date  . Allergy   . Anemia   . Arthritis   . Cataract    forming   . Dysrhythmia    occ palpitations- no cardiologist  . Exposure to TB    1990's- was treated 2 x then, no s/s   . GERD (gastroesophageal reflux disease)   . History of keloid of skin   . Hypertension   . Neuromuscular disorder ([place])    right leg tumbness and tingling from herniated disc   Past Surgical History:  Procedure Laterality Date  . ABDOMINAL HYSTERECTOMY    . APPENDECTOMY    . BACK SURGERY  Aug 2013  . BACK SURGERY  Aug 2016   L3 4 5 S1  . COLONOSCOPY    . COLONOSCOPY W/ POLYPECTOMY    . KELOID EXCISION     on ear  . KNEE ARTHROSCOPY     left  . KNEE SURGERY    . POLYPECTOMY    . SHOULDER OPEN ROTATOR  CUFF REPAIR     left  . TUBAL LIGATION     Family History  Adopted: Yes  Problem Relation Age of Onset  . Arthritis Mother   . Heart disease Father    Social History   Socioeconomic History  . Marital status: Widowed    Spouse name: Not on file  . Number of children: 5  . Years of education: 52  . Highest education level: Not on file  Occupational History  . Occupation: Chemical engineer    Comment: retired  Tobacco Use  . Smoking status: Never Smoker  . Smokeless tobacco: Never Used  Vaping Use  . Vaping Use: Never used  Substance and Sexual Activity  . Alcohol use: No  . Drug use: No  . Sexual activity: Never  Other Topics Concern    . Not on file  Social History Narrative   HSG, [employer] BA, Master's degree vocational ed. Married - '66- '79/divorced; married '04   2 sons - '67, '74; 3 daughters - '71, '74, '74; 15 grandchilderns; 3 great-grandchildren. Work - retired from [employer] officer. Marriage in good health.    Social Determinants of Health   Financial Resource Strain: Low Risk   . Difficulty of Paying Living Expenses: Not hard at all  Food Insecurity: No Food Insecurity  . Worried About Charity fundraiser in the Last Year: Never true  . Ran Out of Food in the Last Year: Never true  Transportation Needs: No Transportation Needs  . Lack of Transportation (Medical): No  . Lack of Transportation (Non-Medical): No  Physical Activity: Sufficiently Active  . Days of Exercise per Week: 5 days  . Minutes of Exercise per Session: 30 min  Stress: No Stress Concern Present  . Feeling of Stress : Not at all  Social Connections: Unknown  . Frequency of Communication with Friends and Family: More than three times a week  . Frequency of Social Gatherings with Friends and Family: More than three times a week  . Attends Religious Services: More than 4 times per year  . Active Member of Clubs or Organizations: Yes  . Attends Archivist Meetings: Never  . Marital Status: Patient refused    Tobacco Counseling Counseling given: Not Answered   Clinical Intake:  Pre-visit preparation completed: Yes  Pain : No/denies pain Pain Score: 0-No pain     BMI - recorded: 45.06 Nutritional Status: BMI > 30  Obese Nutritional Risks: None Diabetes: No  How often do you need to have someone help you when you read instructions, pamphlets, or other written materials from your doctor or pharmacy?: 1 - Never What is the last grade level you completed in school?: 18 years (Master's Degree)  Diabetic? no  Interpreter Needed?: No  Information entered by ::  N. , LPN   Activities of Daily  Living In your present state of health, do you have any difficulty performing the following activities: 09/24/2019  Hearing? N  Vision? N  Difficulty concentrating or making decisions? N  Walking or climbing stairs? N  Dressing or bathing? N  Doing errands, shopping? N  Preparing Food and eating ? N  Using the Toilet? N  In the past six months, have you accidently leaked urine? Y  Comment wears pad for protection  Do you have problems with loss of bowel control? N  Managing your Medications? N  Managing your Finances? N  Housekeeping or managing your Housekeeping? N  Some recent data might be hidden  Patient Care Team: Hoyt Koch, MD as PCP - General (Internal Medicine)  Indicate any recent Medical Services you may have received from other than [facility] providers in the past year (date may be approximate).     Assessment:   This is a routine wellness examination for Carolyn Fowler.  Hearing/Vision screen No exam data present  Dietary issues and exercise activities discussed: Current Exercise Habits: Home exercise routine, Type of exercise: walking, Time (Minutes): 30, Frequency (Times/Week): 5, Weekly Exercise (Minutes/Week): 150, Intensity: Moderate, Exercise limited by: orthopedic condition(s);neurologic condition(s)  Goals    .  Patient Stated      I am going to be more committed to going to [employer] and choosing the right foods to eat. Enjoy life, family, travel and worship God.     .  Patient Stated (pt-stated)      Would like to lose weight.    .  weight loss      Lost weight in 5 pound increments, continue to exercise, and eat healthy.       Depression Screen PHQ 2/9 Scores 09/24/2019 08/24/2018 08/02/2017 11/22/2016 07/26/2016 02/18/2015  PHQ - 2 Score 0 0 0 0 0 0    Fall Risk Fall Risk  09/24/2019 08/24/2018 08/02/2017 07/26/2016 02/18/2015  Falls in the past year? 0 0 No Yes No  Number falls in past yr: 0 0 - 1 -  Injury with Fall? 0 - - No -  Risk for  fall due to : No Fall Risks Impaired balance/gait;Impaired mobility - Impaired mobility;Impaired balance/gait -  Follow up Falls evaluation completed Falls prevention discussed - Falls prevention discussed -  Comment - - - patient had home fall safety assessment done by [facility] -    Any stairs in or around the home? Yes  If so, are there any without handrails? No  Home free of loose throw rugs in walkways, pet beds, electrical cords, etc? Yes  Adequate lighting in your home to reduce risk of falls? Yes   ASSISTIVE DEVICES UTILIZED TO PREVENT FALLS:  Life alert? No  Use of a cane, walker or w/c? Yes  Grab bars in the bathroom? Yes  Shower chair or bench in shower? Yes  Elevated toilet seat or a handicapped toilet? Yes   TIMED UP AND GO:  Was the test performed? No .  Length of time to ambulate 10 feet: 0 sec.   Gait steady and fast with assistive device  Cognitive Function: MMSE - Mini Mental State Exam 08/02/2017  Orientation to time 5  Orientation to Place 5  Registration 3  Attention/ Calculation 5  Recall 3  Language- name 2 objects 2  Language- repeat 1  Language- follow 3 step command 3  Language- read & follow direction 1  Write a sentence 1  Copy design 1  Total score 30     6CIT Screen 09/24/2019  What Year? 0 points  What month? 0 points  What time? 3 points  Count back from 20 0 points  Months in reverse 0 points  Repeat phrase 0 points  Total Score 3    Immunizations Immunization History  Administered Date(s) Administered  . Fluad Quad(high Dose 65+) 12/14/2018  . Moderna SARS-COVID-2 Vaccination 04/30/2019, 05/29/2019  . Pneumococcal Conjugate-13 02/18/2015  . Pneumococcal Polysaccharide-23 07/01/2011, 09/15/2017  . Td 09/23/2009    TDAP status: Up to date Flu Vaccine status: Up to date Pneumococcal vaccine status: Up to date Covid-19 vaccine status: Completed vaccines  Qualifies for Shingles  Vaccine? Yes   Zostavax completed No   Shingrix  Completed?: No.    Education has been provided regarding the importance of this vaccine. Patient has been advised to call insurance company to determine out of pocket expense if they have not yet received this vaccine. Advised may also receive vaccine at local pharmacy or Health Dept. Verbalized acceptance and understanding.  Screening Tests Health Maintenance  Topic Date Due  . Hepatitis C Screening  Never done  . MAMMOGRAM  08/30/2019  . TETANUS/TDAP  09/24/2019  . INFLUENZA VACCINE  10/07/2019  . COLONOSCOPY  12/08/2023  . DEXA SCAN  Completed  . COVID-19 Vaccine  Completed  . PNA vac Low Risk Adult  Completed    Health Maintenance  Health Maintenance Due  Topic Date Due  . Hepatitis C Screening  Never done  . MAMMOGRAM  08/30/2019  . TETANUS/TDAP  09/24/2019    Colorectal cancer screening: Completed 12/08/2018. Repeat every 3-5 years Mammogram status: Completed 08/29/2017. Repeat every year Bone Density status: Completed 07/29/2014. Results reflect: Bone density results: NORMAL. Repeat every 5 years.  Lung Cancer Screening: (Low Dose CT Chest recommended if Age 55-80 years, 30 pack-year currently smoking OR have quit w/in 15years.) does not qualify.   Lung Cancer Screening Referral: no  Additional Screening:  Hepatitis C Screening: does qualify; Completed no  Vision Screening: Recommended annual ophthalmology exams for early detection of glaucoma and other disorders of the eye. Is the patient up to date with their annual eye exam?  Yes  Who is the provider or what is the name of the office in which the patient attends annual eye exams? [facility] If pt is not established with a provider, would they like to be referred to a provider to establish care? Yes .   Dental Screening: Recommended annual dental exams for proper oral hygiene  Community Resource Referral / Chronic Care Management: CRR required this visit?  No   CCM required this visit?  No      Plan:      I have personally reviewed and noted the following in the patient's chart:   . Medical and social history . Use of alcohol, tobacco or illicit drugs  . Current medications and supplements . Functional ability and status . Nutritional status . Physical activity . Advanced directives . List of other physicians . Hospitalizations, surgeries, and ER visits in previous 12 months . Vitals . Screenings to include cognitive, depression, and falls . Referrals and appointments  In addition, I have reviewed and discussed with patient certain preventive protocols, quality metrics, and best practice recommendations. A written personalized care plan for preventive services as well as general preventive health recommendations were provided to patient.     Sheral Flow, LPN   08/19/4313   Nurse Notes: N/A

## 2019-09-26 DIAGNOSIS — H2511 Age-related nuclear cataract, right eye: Secondary | ICD-10-CM | POA: Diagnosis not present

## 2019-09-26 DIAGNOSIS — H25012 Cortical age-related cataract, left eye: Secondary | ICD-10-CM | POA: Diagnosis not present

## 2019-10-12 ENCOUNTER — Ambulatory Visit: Payer: Medicare PPO | Admitting: Internal Medicine

## 2019-10-12 ENCOUNTER — Encounter: Payer: Self-pay | Admitting: Internal Medicine

## 2019-10-12 ENCOUNTER — Other Ambulatory Visit: Payer: Self-pay

## 2019-10-12 DIAGNOSIS — L748 Other eccrine sweat disorders: Secondary | ICD-10-CM | POA: Diagnosis not present

## 2019-10-12 MED ORDER — SULFAMETHOXAZOLE-TRIMETHOPRIM 800-160 MG PO TABS: 1.0000 | ORAL_TABLET | Freq: Two times a day (BID) | ORAL | 0 refills | Status: DC

## 2019-10-12 NOTE — Progress Notes (Signed)
° °  Subjective:   Patient ID: Carolyn Fowler, female    DOB: March 09, 1945, 74 y.o.   MRN: [id number]  HPI The patient is a 74 YO female coming in for the cyst under her right arm. She noticed that it burst a couple of days ago. Herself and her daughter did express a lot of material from this area. They think they got it all out but it is irritated from that. Wanted it checked out. Denies fevers or chills.   Review of Systems  Constitutional: Negative.   HENT: Negative.   Eyes: Negative.   Respiratory: Negative for cough, chest tightness and shortness of breath.   Cardiovascular: Negative for chest pain, palpitations and leg swelling.  Gastrointestinal: Negative for abdominal distention, abdominal pain, constipation, diarrhea, nausea and vomiting.  Musculoskeletal: Negative.   Skin: Positive for wound.  Neurological: Negative.   Psychiatric/Behavioral: Negative.     Objective:  Physical Exam Constitutional:      Appearance: She is well-developed.  HENT:     Head: Normocephalic and atraumatic.  Cardiovascular:     Rate and Rhythm: Normal rate and regular rhythm.  Pulmonary:     Effort: Pulmonary effort is normal. No respiratory distress.     Breath sounds: Normal breath sounds. No wheezing or rales.  Abdominal:     General: Bowel sounds are normal. There is no distension.     Palpations: Abdomen is soft.     Tenderness: There is no abdominal tenderness. There is no rebound.  Musculoskeletal:     Cervical back: Normal range of motion.     Comments: Wound examined right axillary with head with pus expressible, no redness or cellulitis on the skin.   Skin:    General: Skin is warm and dry.  Neurological:     Mental Status: She is alert and oriented to person, place, and time.     Coordination: Coordination normal.     Vitals:   10/12/19 1416  BP: 126/84  Pulse: 66  Temp: 98.6 F (37 C)  TempSrc: Oral  SpO2: 95%  Weight: 252 lb (114.3 kg)  Height: 5\' 3"  (1.6 m)     This visit occurred during the SARS-CoV-2 public health emergency.  Safety protocols were in place, including screening questions prior to the visit, additional usage of staff PPE, and extensive cleaning of exam room while observing appropriate contact time as indicated for disinfecting solutions.   Assessment & Plan:

## 2019-10-12 NOTE — Patient Instructions (Signed)
We have sent in bactrim to take 1 pill twice a day for 5 days to clear the infection.

## 2019-10-12 NOTE — Assessment & Plan Note (Signed)
With some irritation will cover with 5 day bactrim course. Advised that healing will start inside and so outside hole may take weeks to a month to close. Call back for pain, redness, swelling.

## 2019-11-23 ENCOUNTER — Other Ambulatory Visit: Payer: Self-pay | Admitting: Internal Medicine

## 2019-12-19 ENCOUNTER — Ambulatory Visit: Payer: Medicare PPO | Admitting: Internal Medicine

## 2019-12-19 ENCOUNTER — Other Ambulatory Visit: Payer: Self-pay

## 2019-12-19 ENCOUNTER — Encounter: Payer: Self-pay | Admitting: Internal Medicine

## 2019-12-19 VITALS — BP 158/90 | HR 66 | Temp 98.5°F | Ht 63.0 in | Wt 251.0 lb

## 2019-12-19 DIAGNOSIS — L748 Other eccrine sweat disorders: Secondary | ICD-10-CM | POA: Diagnosis not present

## 2019-12-19 DIAGNOSIS — Z23 Encounter for immunization: Secondary | ICD-10-CM

## 2019-12-19 MED ORDER — SULFAMETHOXAZOLE-TRIMETHOPRIM 800-160 MG PO TABS: 1.0000 | ORAL_TABLET | Freq: Two times a day (BID) | ORAL | 0 refills | Status: DC

## 2019-12-19 NOTE — Assessment & Plan Note (Signed)
Subacute This grew over the past year and became acutely infected and drained a couple of months ago.  She was treated with 5 days of Bactrim The cyst appears to be healing and this will likely take a while because I have a feeling this was deep There is some very mild residual blood and minimal pus so I am concerned there may be just a slight residual infection We will treat with Bactrim twice daily x7 days Has follow-up with PCP next month to reevaluate, but reassured her this likely just need more time.  If not improving may need to see surgery/dermatology

## 2019-12-19 NOTE — Progress Notes (Signed)
Subjective:    Patient ID: Carolyn Fowler, female    DOB: 1945/10/27, 74 y.o.   MRN: [id number]  HPI The patient is here for an acute visit.   She was seen 8/6 for an infected right axilla cyst and was treated with bactrim BID x 5 days  It started like a blackhead and increased to the size of a quarter over two years.  One day it just burst.  She did get a lot of fluid out of it.  There was a hole left, which is when she came in to see Dr. Sharlet Salina on 8/6.  It still has some oozing blood and pus intermittently.  It is a minimal amount..  It is not tender and there is no erythema.  She denies fever or chills.  She was concerned because she was unsure how long it would take for this to heal     Medications and allergies reviewed with patient and updated if appropriate.  Patient Active Problem List   Diagnosis Date Noted  . Abscess 12/14/2018  . Sweat gland cyst 09/16/2017  . Morbid obesity ([place]) 04/23/2016  . GERD (gastroesophageal reflux disease) 04/23/2016  . Rash 11/16/2014  . Liver cyst 06/19/2012  . Spinal stenosis of lumbar region at multiple levels 10/27/2011  . Fibrocystic breast disease 07/27/2011  . Routine health maintenance 07/05/2011  . Essential hypertension 03/15/2007    Current Outpatient Medications on File Prior to Visit  Medication Sig Dispense Refill  . acetaminophen (TYLENOL) 500 MG tablet Take by mouth.    Marland Kitchen aspirin 81 MG tablet Take 81 mg by mouth daily.    . famotidine (PEPCID) 20 MG tablet Take 1 tablet by mouth twice daily 180 tablet 0  . fluticasone (FLONASE) 50 MCG/ACT nasal spray Place 2 sprays into both nostrils daily. 16 g 6  . hydrochlorothiazide (HYDRODIURIL) 25 MG tablet TAKE 1 TABLET BY MOUTH ONCE DAILY . APPOINTMENT REQUIRED FOR FUTURE REFILLS 90 tablet 0  . metoprolol tartrate (LOPRESSOR) 50 MG tablet Take 1 tablet by mouth twice daily 180 tablet 0  . Multiple Vitamin (MULTIVITAMIN WITH MINERALS) TABS Take 1 tablet by mouth daily.      . naproxen sodium (ALEVE) 220 MG tablet Take 220 mg by mouth as needed.    . nystatin-triamcinolone ointment (MYCOLOG) Apply 1 application topically 2 (two) times daily. 100 g 3  . ofloxacin (OCUFLOX) 0.3 % ophthalmic solution     . prednisoLONE acetate (PRED FORTE) 1 % ophthalmic suspension     . Sennosides (SENOKOT PO) Take by mouth as needed.      No current facility-administered medications on file prior to visit.    Past Medical History:  Diagnosis Date  . Allergy   . Anemia   . Arthritis   . Cataract    forming   . Dysrhythmia    occ palpitations- no cardiologist  . Exposure to TB    1990's- was treated 2 x then, no s/s   . GERD (gastroesophageal reflux disease)   . History of keloid of skin   . Hypertension   . Neuromuscular disorder ([place])    right leg tumbness and tingling from herniated disc    Past Surgical History:  Procedure Laterality Date  . ABDOMINAL HYSTERECTOMY    . APPENDECTOMY    . BACK SURGERY  Aug 2013  . BACK SURGERY  Aug 2016   L3 4 5 S1  . COLONOSCOPY    . COLONOSCOPY W/ POLYPECTOMY    .  KELOID EXCISION     on ear  . KNEE ARTHROSCOPY     left  . KNEE SURGERY    . POLYPECTOMY    . SHOULDER OPEN ROTATOR CUFF REPAIR     left  . TUBAL LIGATION      Social History   Socioeconomic History  . Marital status: Widowed    Spouse name: Not on file  . Number of children: 5  . Years of education: 33  . Highest education level: Not on file  Occupational History  . Occupation: Chemical engineer    Comment: retired  Tobacco Use  . Smoking status: Never Smoker  . Smokeless tobacco: Never Used  Vaping Use  . Vaping Use: Never used  Substance and Sexual Activity  . Alcohol use: No  . Drug use: No  . Sexual activity: Never  Other Topics Concern  . Not on file  Social History Narrative   HSG, [employer] BA, Master's degree vocational ed. Married - '66- '79/divorced; married '04   2 sons - '67, '74; 3 daughters - '71, '74, '74; 15 grandchilderns;  3 great-grandchildren. Work - retired from Merchant [employer] officer. Marriage in good health.    Social Determinants of Health   Financial Resource Strain: Low Risk   . Difficulty of Paying Living Expenses: Not hard at all  Food Insecurity: No Food Insecurity  . Worried About Charity fundraiser in the Last Year: Never true  . Ran Out of Food in the Last Year: Never true  Transportation Needs: No Transportation Needs  . Lack of Transportation (Medical): No  . Lack of Transportation (Non-Medical): No  Physical Activity: Sufficiently Active  . Days of Exercise per Week: 5 days  . Minutes of Exercise per Session: 30 min  Stress: No Stress Concern Present  . Feeling of Stress : Not at all  Social Connections: Unknown  . Frequency of Communication with Friends and Family: More than three times a week  . Frequency of Social Gatherings with Friends and Family: More than three times a week  . Attends Religious Services: More than 4 times per year  . Active Member of Clubs or Organizations: Yes  . Attends Archivist Meetings: Never  . Marital Status: Patient refused    Family History  Adopted: Yes  Problem Relation Age of Onset  . Arthritis Mother   . Heart disease Father     Review of Systems     Objective:   Vitals:   12/19/19 1327  BP: (!) 158/90  Pulse: 66  Temp: 98.5 F (36.9 C)  SpO2: 96%   BP Readings from Last 3 Encounters:  12/19/19 (!) 158/90  10/12/19 126/84  09/24/19 130/80   Wt Readings from Last 3 Encounters:  12/19/19 251 lb (113.9 kg)  10/12/19 252 lb (114.3 kg)  09/24/19 254 lb 6.4 oz (115.4 kg)   Body mass index is 44.46 kg/m.   Physical Exam Constitutional:      General: She is not in acute distress.    Appearance: Normal appearance. She is not ill-appearing.  HENT:     Head: Normocephalic and atraumatic.  Skin:    General: Skin is warm and dry.     Findings: Lesion (Small punctate opening in the right axilla with minimal blood and  minimal pus discharge with palpation.  No fluctuance or induration.  No tenderness.  No surrounding erythema) present.  Neurological:     Mental Status: She is alert.  Assessment & Plan:    See Problem List for Assessment and Plan of chronic medical problems.    This visit occurred during the SARS-CoV-2 public health emergency.  Safety protocols were in place, including screening questions prior to the visit, additional usage of staff PPE, and extensive cleaning of exam room while observing appropriate contact time as indicated for disinfecting solutions.

## 2019-12-19 NOTE — Patient Instructions (Signed)
Take the antibiotic as prescribed.    Follow up with Dr Sharlet Salina next month as scheduled.

## 2019-12-25 ENCOUNTER — Encounter: Payer: Medicare PPO | Admitting: Internal Medicine

## 2020-01-08 ENCOUNTER — Other Ambulatory Visit: Payer: Self-pay | Admitting: Internal Medicine

## 2020-01-17 ENCOUNTER — Other Ambulatory Visit: Payer: Self-pay

## 2020-01-17 ENCOUNTER — Encounter: Payer: Self-pay | Admitting: Internal Medicine

## 2020-01-17 ENCOUNTER — Ambulatory Visit (INDEPENDENT_AMBULATORY_CARE_PROVIDER_SITE_OTHER): Payer: Medicare PPO | Admitting: Internal Medicine

## 2020-01-17 VITALS — BP 162/88 | HR 66 | Temp 99.7°F | Ht 63.0 in | Wt 247.0 lb

## 2020-01-17 DIAGNOSIS — Z1159 Encounter for screening for other viral diseases: Secondary | ICD-10-CM | POA: Diagnosis not present

## 2020-01-17 DIAGNOSIS — I10 Essential (primary) hypertension: Secondary | ICD-10-CM | POA: Diagnosis not present

## 2020-01-17 DIAGNOSIS — Z0189 Encounter for other specified special examinations: Secondary | ICD-10-CM | POA: Diagnosis not present

## 2020-01-17 DIAGNOSIS — Z0000 Encounter for general adult medical examination without abnormal findings: Secondary | ICD-10-CM

## 2020-01-17 DIAGNOSIS — Z961 Presence of intraocular lens: Secondary | ICD-10-CM | POA: Diagnosis not present

## 2020-01-17 LAB — CBC
HCT: 43.1 % (ref 36.0–46.0)
Hemoglobin: 13.6 g/dL (ref 12.0–15.0)
MCHC: 31.5 g/dL (ref 30.0–36.0)
MCV: 73.3 fl — ABNORMAL LOW (ref 78.0–100.0)
Platelets: 239 10*3/uL (ref 150.0–400.0)
RBC: 5.88 Mil/uL — ABNORMAL HIGH (ref 3.87–5.11)
RDW: 15.2 % (ref 11.5–15.5)
WBC: 6.7 10*3/uL (ref 4.0–10.5)

## 2020-01-17 LAB — LIPID PANEL
Cholesterol: 191 mg/dL (ref 0–200)
HDL: 57.1 mg/dL (ref >39.00)
LDL Cholesterol: 117 mg/dL — ABNORMAL HIGH (ref 0–99)
NonHDL: 133.78
Total CHOL/HDL Ratio: 3
Triglycerides: 82 mg/dL (ref 0.0–149.0)
VLDL: 16.4 mg/dL (ref 0.0–40.0)

## 2020-01-17 LAB — COMPREHENSIVE METABOLIC PANEL
ALT: 14 U/L (ref 0–35)
AST: 14 U/L (ref 0–37)
Albumin: 4 g/dL (ref 3.5–5.2)
Alkaline Phosphatase: 62 U/L (ref 39–117)
BUN: 15 mg/dL (ref 6–23)
CO2: 33 mEq/L — ABNORMAL HIGH (ref 19–32)
Calcium: 9.6 mg/dL (ref 8.4–10.5)
Chloride: 100 mEq/L (ref 96–112)
Creatinine, Ser: 0.64 mg/dL (ref 0.40–1.20)
GFR: 87.71 mL/min (ref >60.00)
Glucose, Bld: 91 mg/dL (ref 70–99)
Potassium: 3.9 mEq/L (ref 3.5–5.1)
Sodium: 138 mEq/L (ref 135–145)
Total Bilirubin: 0.6 mg/dL (ref 0.2–1.2)
Total Protein: 6.9 g/dL (ref 6.0–8.3)

## 2020-01-17 LAB — HEMOGLOBIN A1C: Hgb A1c MFr Bld: 5.8 % (ref 4.6–6.5)

## 2020-01-17 NOTE — Progress Notes (Signed)
   Subjective:   Patient ID: Carolyn Fowler, female    DOB: Oct 02, 1945, 74 y.o.   MRN: [id number]  HPI The patient is a 74 YO female coming in for physical.   PMH, [place], social history reviewed and updated  Review of Systems  Constitutional: Negative.   HENT: Negative.   Eyes: Negative.   Respiratory: Negative for cough, chest tightness and shortness of breath.   Cardiovascular: Negative for chest pain, palpitations and leg swelling.  Gastrointestinal: Negative for abdominal distention, abdominal pain, constipation, diarrhea, nausea and vomiting.  Musculoskeletal: Positive for arthralgias.  Skin: Negative.   Neurological: Negative.   Psychiatric/Behavioral: Negative.     Objective:  Physical Exam Constitutional:      Appearance: She is well-developed.  HENT:     Head: Normocephalic and atraumatic.  Cardiovascular:     Rate and Rhythm: Normal rate and regular rhythm.  Pulmonary:     Effort: Pulmonary effort is normal. No respiratory distress.     Breath sounds: Normal breath sounds. No wheezing or rales.  Abdominal:     General: Bowel sounds are normal. There is no distension.     Palpations: Abdomen is soft.     Tenderness: There is no abdominal tenderness. There is no rebound.  Musculoskeletal:     Cervical back: Normal range of motion.  Skin:    General: Skin is warm and dry.  Neurological:     Mental Status: She is alert and oriented to person, place, and time.     Coordination: Coordination normal.     Vitals:   01/17/20 0821  BP: (!) 162/102  Pulse: 66  Temp: 99.7 F (37.6 C)  TempSrc: Oral  SpO2: 94%  Weight: 247 lb (112 kg)  Height: 5\' 3"  (1.6 m)    This visit occurred during the SARS-CoV-2 public health emergency.  Safety protocols were in place, including screening questions prior to the visit, additional usage of staff PPE, and extensive cleaning of exam room while observing appropriate contact time as indicated for disinfecting solutions.    Assessment & Plan:

## 2020-01-17 NOTE — Patient Instructions (Signed)
Health Maintenance, Female Adopting a healthy lifestyle and getting preventive care are important in promoting health and wellness. Ask your health care provider about:  The right schedule for you to have regular tests and exams.  Things you can do on your own to prevent diseases and keep yourself healthy. What should I know about diet, weight, and exercise? Eat a healthy diet   Eat a diet that includes plenty of vegetables, fruits, low-fat dairy products, and lean protein.  Do not eat a lot of foods that are high in solid fats, added sugars, or sodium. Maintain a healthy weight Body mass index (BMI) is used to identify weight problems. It estimates body fat based on height and weight. Your health care provider can help determine your BMI and help you achieve or maintain a healthy weight. Get regular exercise Get regular exercise. This is one of the most important things you can do for your health. Most adults should:  Exercise for at least 150 minutes each week. The exercise should increase your heart rate and make you sweat (moderate-intensity exercise).  Do strengthening exercises at least twice a week. This is in addition to the moderate-intensity exercise.  Spend less time sitting. Even light physical activity can be beneficial. Watch cholesterol and blood lipids Have your blood tested for lipids and cholesterol at 73 years of age, then have this test every 5 years. Have your cholesterol levels checked more often if:  Your lipid or cholesterol levels are high.  You are older than 73 years of age.  You are at high risk for heart disease. What should I know about cancer screening? Depending on your health history and family history, you may need to have cancer screening at various ages. This may include screening for:  Breast cancer.  Cervical cancer.  Colorectal cancer.  Skin cancer.  Lung cancer. What should I know about heart disease, diabetes, and high blood  pressure? Blood pressure and heart disease  High blood pressure causes heart disease and increases the risk of stroke. This is more likely to develop in people who have high blood pressure readings, are of African descent, or are overweight.  Have your blood pressure checked: ? Every 3-5 years if you are 18-39 years of age. ? Every year if you are 40 years old or older. Diabetes Have regular diabetes screenings. This checks your fasting blood sugar level. Have the screening done:  Once every three years after age 40 if you are at a normal weight and have a low risk for diabetes.  More often and at a younger age if you are overweight or have a high risk for diabetes. What should I know about preventing infection? Hepatitis B If you have a higher risk for hepatitis B, you should be screened for this virus. Talk with your health care provider to find out if you are at risk for hepatitis B infection. Hepatitis C Testing is recommended for:  Everyone born from 1945 through 1965.  Anyone with known risk factors for hepatitis C. Sexually transmitted infections (STIs)  Get screened for STIs, including gonorrhea and chlamydia, if: ? You are sexually active and are younger than 73 years of age. ? You are older than 73 years of age and your health care provider tells you that you are at risk for this type of infection. ? Your sexual activity has changed since you were last screened, and you are at increased risk for chlamydia or gonorrhea. Ask your health care provider if   you are at risk.  Ask your health care provider about whether you are at high risk for HIV. Your health care provider may recommend a prescription medicine to help prevent HIV infection. If you choose to take medicine to prevent HIV, you should first get tested for HIV. You should then be tested every 3 months for as long as you are taking the medicine. Pregnancy  If you are about to stop having your period (premenopausal) and  you may become pregnant, seek counseling before you get pregnant.  Take 400 to 800 micrograms (mcg) of folic acid every day if you become pregnant.  Ask for birth control (contraception) if you want to prevent pregnancy. Osteoporosis and menopause Osteoporosis is a disease in which the bones lose minerals and strength with aging. This can result in bone fractures. If you are 65 years old or older, or if you are at risk for osteoporosis and fractures, ask your health care provider if you should:  Be screened for bone loss.  Take a calcium or vitamin D supplement to lower your risk of fractures.  Be given hormone replacement therapy (HRT) to treat symptoms of menopause. Follow these instructions at home: Lifestyle  Do not use any products that contain nicotine or tobacco, such as cigarettes, e-cigarettes, and chewing tobacco. If you need help quitting, ask your health care provider.  Do not use street drugs.  Do not share needles.  Ask your health care provider for help if you need support or information about quitting drugs. Alcohol use  Do not drink alcohol if: ? Your health care provider tells you not to drink. ? You are pregnant, may be pregnant, or are planning to become pregnant.  If you drink alcohol: ? Limit how much you use to 0-1 drink a day. ? Limit intake if you are breastfeeding.  Be aware of how much alcohol is in your drink. In the [place] one drink equals one 12 oz bottle of beer (355 mL), one 5 oz glass of wine (148 mL), or one 1 oz glass of hard liquor (44 mL). General instructions  Schedule regular health, dental, and eye exams.  Stay current with your vaccines.  Tell your health care provider if: ? You often feel depressed. ? You have ever been abused or do not feel safe at home. Summary  Adopting a healthy lifestyle and getting preventive care are important in promoting health and wellness.  Follow your health care provider's instructions about healthy  diet, exercising, and getting tested or screened for diseases.  Follow your health care provider's instructions on monitoring your cholesterol and blood pressure. This information is not intended to replace advice given to you by your health care provider. Make sure you discuss any questions you have with your health care provider. Document Revised: 02/15/2018 Document Reviewed: 02/15/2018 Elsevier Patient Education  2020 [employer] Inc.  

## 2020-01-18 LAB — HEPATITIS C ANTIBODY
Hepatitis C Ab: NONREACTIVE
SIGNAL TO CUT-OFF: 0.66 (ref <1.00)
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
## 2020-01-18 NOTE — Assessment & Plan Note (Signed)
Has not taken meds today due to diuretic. Taking HCTZ and metoprolol. Overall good control on meds. She declines need for adjustment today but will monitor BP at home. Checking CMP.

## 2020-01-18 NOTE — Assessment & Plan Note (Addendum)
Weight stable, encouraged to make changes to diet and add exercise when able.

## 2020-02-14 ENCOUNTER — Other Ambulatory Visit: Payer: Self-pay | Admitting: Internal Medicine

## 2020-04-17 ENCOUNTER — Other Ambulatory Visit: Payer: Self-pay | Admitting: Internal Medicine

## 2020-04-19 ENCOUNTER — Telehealth: Payer: Self-pay | Admitting: Internal Medicine

## 2020-04-19 ENCOUNTER — Other Ambulatory Visit: Payer: Self-pay | Admitting: Internal Medicine

## 2020-04-24 MED ORDER — METOPROLOL TARTRATE 50 MG PO TABS: 50.0000 mg | ORAL_TABLET | Freq: Two times a day (BID) | ORAL | 0 refills | Status: DC

## 2020-04-24 NOTE — Telephone Encounter (Signed)
Medication has been sent to the patient's pharmacy.  

## 2020-04-24 NOTE — Addendum Note (Signed)
Addended by: Thomes Cake on: 04/24/2020 10:01 AM   Modules accepted: Orders

## 2020-04-24 NOTE — Telephone Encounter (Signed)
   Patient requesting refill for Metoprolol,she has no medication remaining

## 2020-05-06 ENCOUNTER — Telehealth: Payer: Self-pay | Admitting: Internal Medicine

## 2020-05-06 MED ORDER — HYDROCHLOROTHIAZIDE 25 MG PO TABS: ORAL_TABLET | ORAL | 0 refills | Status: DC

## 2020-05-06 NOTE — Telephone Encounter (Signed)
hydrochlorothiazide (HYDRODIURIL) 25 MG tablet [place] ([place]), [place] - [address] Phone:  [phone]  Fax:  [phone]     Last seen- 11.11.21  Next apt- 11.15.22 Requesting a refill

## 2020-05-06 NOTE — Telephone Encounter (Signed)
Refill has been sent to the patient's pharmacy.  

## 2020-05-29 ENCOUNTER — Encounter: Payer: Self-pay | Admitting: Internal Medicine

## 2020-05-29 ENCOUNTER — Ambulatory Visit: Payer: Medicare PPO | Admitting: Internal Medicine

## 2020-05-29 ENCOUNTER — Other Ambulatory Visit: Payer: Self-pay

## 2020-05-29 VITALS — BP 136/78 | HR 74 | Temp 98.7°F | Resp 18 | Ht 63.0 in | Wt 249.4 lb

## 2020-05-29 DIAGNOSIS — I10 Essential (primary) hypertension: Secondary | ICD-10-CM | POA: Diagnosis not present

## 2020-05-29 DIAGNOSIS — M791 Myalgia, unspecified site: Secondary | ICD-10-CM | POA: Diagnosis not present

## 2020-05-29 MED ORDER — METOPROLOL TARTRATE 50 MG PO TABS: 50.0000 mg | ORAL_TABLET | Freq: Two times a day (BID) | ORAL | 3 refills | Status: DC

## 2020-05-29 NOTE — Progress Notes (Signed)
   Subjective:   Patient ID: Carolyn Fowler, female    DOB: [birth year], 75 y.o.   MRN: [id number]  HPI The patient is a 75 YO female coming in for follow up BP (running high last visit prior to meds, she is not monitoring at home, wanted to make sure it was okay, denies headaches or chest pains, taking hctz and metoprolol) and concerns about muscle aches and lupus (strong family history of lupus, random muscle aches and pain, wondering if she may have this).   Review of Systems  Constitutional: Negative.   HENT: Negative.   Eyes: Negative.   Respiratory: Negative for cough, chest tightness and shortness of breath.   Cardiovascular: Negative for chest pain, palpitations and leg swelling.  Gastrointestinal: Negative for abdominal distention, abdominal pain, constipation, diarrhea, nausea and vomiting.  Musculoskeletal: Positive for myalgias.  Skin: Negative.   Neurological: Negative.   Psychiatric/Behavioral: Negative.     Objective:  Physical Exam Constitutional:      Appearance: She is well-developed.  HENT:     Head: Normocephalic and atraumatic.  Cardiovascular:     Rate and Rhythm: Normal rate and regular rhythm.  Pulmonary:     Effort: Pulmonary effort is normal. No respiratory distress.     Breath sounds: Normal breath sounds. No wheezing or rales.  Abdominal:     General: Bowel sounds are normal. There is no distension.     Palpations: Abdomen is soft.     Tenderness: There is no abdominal tenderness. There is no rebound.  Musculoskeletal:     Cervical back: Normal range of motion.  Skin:    General: Skin is warm and dry.  Neurological:     Mental Status: She is alert and oriented to person, place, and time.     Coordination: Coordination abnormal.     Comments: cane     Vitals:   05/29/20 1046  BP: 136/78  Pulse: 74  Resp: 18  Temp: 98.7 F (37.1 C)  TempSrc: Oral  SpO2: 94%  Weight: 249 lb 6.4 oz (113.1 kg)  Height: 5\' 3"  (1.6 m)    This visit  occurred during the SARS-CoV-2 public health emergency.  Safety protocols were in place, including screening questions prior to the visit, additional usage of staff PPE, and extensive cleaning of exam room while observing appropriate contact time as indicated for disinfecting solutions.   Assessment & Plan:

## 2020-05-29 NOTE — Assessment & Plan Note (Signed)
Checking ANA panel for lupus given strong family history.

## 2020-05-29 NOTE — Patient Instructions (Signed)
We will check you for lupus today.

## 2020-05-29 NOTE — Assessment & Plan Note (Signed)
BP at goal on hctz and metoprolol. Recent labs reviewed with patient. Refilled metoprolol.

## 2020-05-31 LAB — ANA, IFA COMPREHENSIVE PANEL
Anti Nuclear Antibody (ANA): POSITIVE — AB
ENA SM Ab Ser-aCnc: <1 AI
SM/RNP: 3.4 AI — AB
SSA (Ro) (ENA) Antibody, IgG: 1 AI — AB
SSB (La) (ENA) Antibody, IgG: <1 AI
Scleroderma (Scl-70) (ENA) Antibody, IgG: <1 AI
ds DNA Ab: 1 IU/mL

## 2020-05-31 LAB — ANTI-NUCLEAR AB-TITER (ANA TITER)
ANA TITER: 1:40 {titer} — ABNORMAL HIGH
ANA Titer 1: 1:80 {titer} — ABNORMAL HIGH

## 2020-06-02 ENCOUNTER — Other Ambulatory Visit: Payer: Self-pay | Admitting: Internal Medicine

## 2020-06-02 DIAGNOSIS — M791 Myalgia, unspecified site: Secondary | ICD-10-CM
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
## 2020-07-09 ENCOUNTER — Ambulatory Visit: Payer: Self-pay

## 2020-07-09 ENCOUNTER — Encounter: Payer: Self-pay | Admitting: Internal Medicine

## 2020-07-09 ENCOUNTER — Other Ambulatory Visit: Payer: Self-pay

## 2020-07-09 ENCOUNTER — Ambulatory Visit: Payer: Medicare PPO | Admitting: Internal Medicine

## 2020-07-09 VITALS — BP 155/93 | HR 66 | Ht 62.0 in | Wt 253.0 lb

## 2020-07-09 DIAGNOSIS — M79642 Pain in left hand: Secondary | ICD-10-CM

## 2020-07-09 DIAGNOSIS — R768 Other specified abnormal immunological findings in serum: Secondary | ICD-10-CM

## 2020-07-09 DIAGNOSIS — M79641 Pain in right hand: Secondary | ICD-10-CM

## 2020-07-09 DIAGNOSIS — M791 Myalgia, unspecified site: Secondary | ICD-10-CM

## 2020-07-09 DIAGNOSIS — R7689 Other specified abnormal immunological findings in serum: Secondary | ICD-10-CM | POA: Insufficient documentation

## 2020-07-09 NOTE — Patient Instructions (Signed)
Complement Assay Test Why am I having this test? Complement refers to a group of proteins that are part of the body's disease-fighting system (immune system). A complement assay test provides information about some or all of these proteins. You may have this test:  To diagnose a lack, or deficiency, of certain complement proteins. Deficiencies can be passed from parent to child (inherited).  To monitor an infection or autoimmune disease.  If you have unexplained inflammation or swelling (edema).  If you have bacterial infections again and again. What is being tested? This test can be used to measure:  Total complement. This is the total number of protein complements in your blood.  The number of each kind of complement in your blood. The nine main kinds of complement are labeled C1 through C9. Some of these complements, such as C3 and C4, are especially important and have many functions in the body. Depending on why you are having the test, your health care provider may test your total complement or only some individual complements, such as C3 and C4. The total complement assay test may be done before individual complements are tested. What kind of sample is taken? A blood sample is required for this test. It is usually collected by inserting a needle into a blood vessel.   Tell a health care provider about:  Any allergies you have.  All medicines you are taking, including vitamins, herbs, eye drops, creams, and over-the-counter medicines.  Any blood disorders you have.  Any surgeries you have had.  Any medical conditions you have.  Whether you are pregnant or may be pregnant. How are the results reported? Your results will be reported as a value that tells you how much complement is in your blood. This will be given as units per milliliter of blood (units/mL) or as milligrams per deciliter of blood (mg/dL). Your results may be reported as total complement, or as individual  complements, or both. Your health care provider will compare your results to normal ranges that were established after testing a large group of people (reference ranges). Reference ranges may vary among labs and hospitals. For this test, reference ranges for some of the most commonly measured complement assays may be:  Total complement: 30-75 units/mL.  C2: 1-4 mg/dL.  C3: 75-175 mg/dL.  C4: 22-45 units/mL. What do the results mean? Results within reference ranges are considered normal, which means you have a normal amount of complement in your blood. Results that are higher than the reference ranges may be caused by:  Inflammatory disease.  Heart attack.  Cancer. Complement deficiencies, or results lower than the reference ranges, may be caused by:  Certain inherited conditions.  Autoimmune disease.  Certain liver diseases.  Malnutrition.  Certain types of anemia that result in breakdown of red blood cells (hemolytic anemia). Talk with your health care provider about what your results mean. Questions to ask your health care provider Ask your health care provider, or the department that is doing the test:  When will my results be ready?  How will I get my results?  What are my treatment options?  What other tests do I need?  What are my next steps? Summary  Complement refers to a group of proteins that are part of the body's disease-fighting system (immune system). A complement assay test can provide information about some or all of these proteins.  You may have a complement assay test to help diagnose a complement deficiency, and to monitor some infections  or autoimmune disease.  Talk with your health care provider about what your results mean.    Cardiolipin Antibodies Test Why am I having this test? Your health care provider may order a cardiolipin antibodies test if:  You have a condition in which the blood clots in an abnormal way (hypercoagulable  state).  You have signs or symptoms of an autoimmune disease, especially systemic lupus erythematosus (SLE).  You have had one or more pregnancy losses (miscarriage). What is being tested? This test measures the level of cardiolipin antibodies, also known as anticardiolipin antibodies, in the blood. Antibodies are proteins made by your body's defense (immune) system. Normal antibodies protect you against germs and other things that can make you sick. Cardiolipin antibodies are abnormal antibodies. They attack fat molecules in the outer layer of body cells and may also attack important blood-clotting cells (platelets). When your immune system attacks normal body cells, it is called an autoimmune reaction. What kind of sample is taken? A blood sample is required for this test. It is usually collected by inserting a needle into a blood vessel.   How do I prepare for this test? Let your health care provider know if you have had a recent infection and if you are taking any medicines. Some infections and medicines can cause your body to develop cardiolipin antibodies. How are the results reported? Your test results will generally be reported as either positive or negative for cardiolipin antibodies. For this test, normal results are:  Negative for cardiolipin antibodies. Other results may be reported as values. Your health care provider will compare your results to normal ranges that were established after testing a large group of people (reference ranges). Reference ranges may vary among labs and hospitals. For this test, common normal reference ranges are:  Less than 23 GPL (IgG phospholipid units).  Less than 11 MPL (IgM phospholipid units). What do the results mean?  A negative test result means that no cardiolipin antibodies were found in your blood at the time of the test.  A positive test result, or results that are higher than the normal reference ranges, could mean that you may be at  increased risk for: ? Miscarriages (if you become pregnant), low platelet counts, and blood clots. This condition is called antiphospholipid syndrome. ? An autoimmune disease, especially SLE.  A positive result may also be seen in patients who have cancer, AIDS, or an infection, or who take certain medicines. Talk with your health care provider about what your results mean. Questions to ask your health care provider Ask your health care provider, or the department that is doing the test:  When will my results be ready?  How will I get my results?  What are my treatment options?  What other tests do I need?  What are my next steps? Summary  A cardiolipin antibodies test may be done if you have abnormal blood clotting, symptoms of an autoimmune disease, such as SLE, or recurrent miscarriages.  Cardiolipin antibodies are abnormal antibodies. They attack fat molecules in the outer layer of body cells and may also attack platelets.  A positive test result could mean that you are at increased risk for miscarriages (if you become pregnant), low platelet counts, and blood clots. This condition is called antiphospholipid syndrome.   Urine Protein Test Why am I having this test? A urine protein test may be used to check the health of your kidneys and to look for kidney damage or kidney disease. The test may  be done as part of a normal screening or if you have a condition that affects your kidneys, such as:  Diabetes.  High blood pressure (hypertension).  Urinary tract infection.  Problems resulting from pregnancy, including preeclampsia. Your health care provider may also do this test if you have symptoms of kidney disease. These can include:  Fluid retention, also called edema.  Tiredness, or fatigue.  Nausea. What is being tested? This test measures the amount of protein in your urine. Your blood contains proteins. When your blood is filtered by your kidneys, most of the  proteins should be returned to your blood and there should be no protein or very little protein in your urine. If you have a large amount of protein in your urine (proteinuria), your kidneys may not be working as well as they should. What kind of sample is taken? This test requires a urine sample. Two methods may be used for the test:  A dipstick test is often the first step in a urine protein test. After you urinate into a germ-free (sterile) cup, your health care provider will dip a test strip into your sample. The health care provider can tell right away if there is protein in your urine.  Your health care provider may do a 24-hour urine test if the dipstick test shows that there is a large amount of protein in your urine. This test measures the amount of protein that is released in your urine over a 24-hour period. You may also have other tests to find out the types of proteins you have in your urine.   How do I collect samples at home? If your health care provider wants to do a 24-hour urine test, you may be asked to collect urine samples at home over a 24-hour period. Follow instructions from a health care provider about how to collect the samples. When collecting a urine sample at home, make sure you:  Use supplies and instructions that you received from the lab.  Collect urine only in the sterile cup that you received from the lab.  Do not let any toilet paper or stool (feces) get into the cup.  Refrigerate the sample or keep it on ice until you can return it to the lab.  Return the sample(s) to the lab as instructed.   Tell a health care provider about:  All medicines you are taking, including vitamins, herbs, eye drops, creams, and over-the-counter medicines.  Any medical conditions you have. Some conditions can cause you to have protein in your urine.  Whether you have had a radiology scan with contrast dye.  Whether you have exercised heavily in the last few days.  Whether  you are pregnant or may be pregnant. How are the results reported? The result of the urine dipstick test for proteins will be reported as either positive or negative.  A negative result means no protein or a small amount of protein was found in your urine.  A positive result means a large amount of protein was found in your urine. The result of the 24-hour urine test will be reported as a value that indicates the amount of protein in your urine. Your health care provider will compare your results to normal ranges that were established after testing a large group of people (reference ranges). Reference ranges may vary among labs and hospitals. For someone at rest, 50-80 mg per 24 hours is considered normal. For someone exercising, 50-250 mg per 24 hours is considered normal. What  do the results mean? Many conditions can cause a level of protein in your urine that is higher than normal, such as:  Dehydration.  Doing exercise that takes a lot of effort (is vigorous).  Urinary tract infection.  High blood pressure, heart failure, or diabetes.  Kidney disease.  Bladder cancer.  Preeclampsia, in pregnant women. Talk with your health care provider about what your results mean. Questions to ask your health care provider Ask your health care provider, or the department that is doing the test:  When will my results be ready?  How will I get my results?  What are my treatment options?  What other tests do I need?  What are my next steps? Summary  The urine protein test is often used to screen for kidney disease.  This test measures the amount of protein in your urine. If you have a large amount of protein in your urine (proteinuria), your kidneys may not be working as well as they should.  Your health care provider may do a 24-hour urine test if the dipstick test shows that there is a large amount of protein in your urine.  Talk with your health care provider about what your results  mean.    Creatine Kinase Test Why am I having this test? The creatine kinase (CK) test is done to check for damage to muscle tissue in the body. When muscles are damaged, they release the enzyme CK into the bloodstream. This test can be used to help diagnose a heart attack or diseases of the skeletal muscles, brain, or spinal cord. What is being tested? The creatine kinase test may measure the following:  The total amount of CK in your blood (total CK).  The amount of three different forms of CK (isoenzymes) in the blood: ? CK-MM, which is found in your skeletal muscles and heart. ? CK-MB, which is found mostly in your heart. ? CK-BB, which is found mostly in your brain. What kind of sample is taken? At least one blood sample is required for this test. It is usually collected by inserting a needle into a blood vessel. In some cases, you may need to have blood samples taken at regular intervals for up to 1 week.   Tell a health care provider about:  All medicines you are taking, including vitamins, herbs, eye drops, creams, and over-the-counter medicines.  Any blood disorders you have.  Any surgeries you have had.  Any medical conditions you have.  Whether you are pregnant or may be pregnant. How are the results reported? Your results will be reported as values that indicate:  How much total CK is in your blood, given as units per liter (units/L).  How much of each measured isoenzyme is in your blood, given as a percentage. Your health care provider will compare your results to normal ranges that were established after testing a large group of people (reference values). Reference values may vary among labs and hospitals. For total CK, common reference values are ranges that vary by age:  Adult or elderly (values are higher after exercise): ? Female: 55-170 units/L. ? Female: 30-135 units/L.  Newborn: 68-580 units/L. Reference values for each isoenzyme are:  CK-MM:  100%.  CK-MB: 0%.  CK-BB: 0%. What do the results mean? Results within reference ranges and values are normal. Levels of total CK that are higher than the reference ranges may mean that you have an injury or a disease affecting your heart, skeletal muscles, or brain. High  levels of CK-MM may mean that you have:  Certain conditions affecting the skeletal muscle. A variety of conditions can lead to breakdown of skeletal muscle (rhabdomyolysis).  A recent history of surgery or injury.  Conditions that cause convulsions. These are episodes of uncontrollable movement caused by sudden, intense tightening (contraction) of the muscles. High levels of CK-MB may mean that you have:  A recent history of heart attack.  Other conditions that cause injury to the heart muscle. High levels of CK-BB may be caused by:  Taking certain psychiatric medicines.  A disease that affects the brain and spinal cord (central nervous system).  Certain types of cancer.  Injury to the lungs. Talk with your health care provider about what your results mean. Questions to ask your health care provider Ask your health care provider, or the department that is doing the test:  When will my results be ready?  How will I get my results?  What are my treatment options?  What other tests do I need?  What are my next steps? Summary  The creatine kinase (CK) test is done to check for damage to muscle tissue in the body.  This test can be used to help diagnose a heart attack or diseases of the skeletal muscles, brain, or spinal cord.  This test involves measuring total CK and three different forms of CK in the blood.  Talk to your health care provider about what your results may mean. This information is not intended to replace advice given to you by your health care provider. Make sure you discuss any questions you have with your health care provider. Document Revised: 11/16/2019 Document Reviewed:  11/16/2019 Elsevier Patient Education  2021 [employer].

## 2020-07-10 ENCOUNTER — Telehealth: Payer: Self-pay | Admitting: Internal Medicine

## 2020-07-10 ENCOUNTER — Telehealth: Payer: Self-pay

## 2020-07-10 LAB — URINALYSIS
Bilirubin Urine: NEGATIVE
Glucose, UA: NEGATIVE
Hgb urine dipstick: NEGATIVE
Ketones, ur: NEGATIVE
Leukocytes,Ua: NEGATIVE
Nitrite: POSITIVE — AB
Protein, ur: NEGATIVE
Specific Gravity, Urine: 1.017 (ref 1.001–1.035)
pH: 6.5 (ref 5.0–8.0)

## 2020-07-10 LAB — BETA-2 GLYCOPROTEIN ANTIBODIES
Beta-2 Glyco 1 IgA: 7 U/mL
Beta-2 Glyco 1 IgM: 9.5 U/mL
Beta-2 Glyco I IgG: 3.8 U/mL

## 2020-07-10 LAB — PROTEIN / CREATININE RATIO, URINE
Creatinine, Urine: 87 mg/dL (ref 20–275)
Protein/Creat Ratio: 80 mg/g creat (ref 21–161)
Protein/Creatinine Ratio: 0.08 mg/mg creat (ref 0.021–0.161)
Total Protein, Urine: 7 mg/dL (ref 5–24)

## 2020-07-10 LAB — C3 AND C4
C3 Complement: 125 mg/dL (ref 83–193)
C4 Complement: 18 mg/dL (ref 15–57)

## 2020-07-10 LAB — CARDIOLIPIN ANTIBODIES, IGG, IGM, IGA
Anticardiolipin IgA: 8.8 APL-U/mL
Anticardiolipin IgG: 3.2 GPL-U/mL
Anticardiolipin IgM: 10.1 MPL-U/mL

## 2020-07-10 LAB — CK: Total CK: 75 U/L (ref 29–143)

## 2020-07-10 MED ORDER — SULFAMETHOXAZOLE-TRIMETHOPRIM 800-160 MG PO TABS: 1.0000 | ORAL_TABLET | Freq: Two times a day (BID) | ORAL | 0 refills | Status: AC

## 2020-07-10 NOTE — Progress Notes (Signed)
Urinalysis test is positive for nitrites which can be seen with urinary tract infections. No other problems on results so far. Is she experiencing any symptoms of a UTI?

## 2020-07-10 NOTE — Telephone Encounter (Signed)
If they checked her for UTI they would treat her usually.

## 2020-07-10 NOTE — Telephone Encounter (Signed)
I called patient, patient verbalized understanding. 

## 2020-07-10 NOTE — Telephone Encounter (Signed)
Recommend treatment with Bactrim twice daily for 3 days. Will send Rx to her pharmacy now.

## 2020-07-10 NOTE — Addendum Note (Signed)
Addended by: Collier Salina on: 07/10/2020 03:35 PM   Modules accepted: Orders

## 2020-07-10 NOTE — Progress Notes (Signed)
Sounds likely for UTI then, can you contact PCP office or have her do so?

## 2020-07-10 NOTE — Telephone Encounter (Signed)
Patient called stating she called her PCP to tell them her urine test showed UTI and requesting a prescription be sent to the pharmacy.  Patient states she also told them that Dr. Benjamine Mola would be sending her labwork results.  Patient states they told her "if Dr. Benjamine Mola diagnosed her with a UTI then he needs to treat it."  Patient requested a prescription be sent to [facility] at [address] [facility] [address].

## 2020-07-10 NOTE — Telephone Encounter (Signed)
Spoke with the patient and she stated the only symptom she is having is urinary urgency and low back pressure.

## 2020-07-10 NOTE — Telephone Encounter (Signed)
Patient called and said that her rheumatologist told her that she has a UTI. She said that they should be sending the office notes over soon. She was wondering if something could be sent to [facility] ([place]), [place] - [address]. Please advise

## 2020-07-10 NOTE — Telephone Encounter (Signed)
Spoke with the patient and she verbalized understanding.

## 2020-07-17 ENCOUNTER — Telehealth: Payer: Self-pay

## 2020-07-17 ENCOUNTER — Other Ambulatory Visit: Payer: Self-pay | Admitting: Internal Medicine

## 2020-07-17 NOTE — Telephone Encounter (Signed)
Incoming call from paramedics that were called to pt's house b/c pt c/o chest pain.  Upon their evaluation, it was determined that pt should go to ER b/c unable to complete blood work (12 lead unremarkable, stable VS & pulses), however, pt "adamantly refusing".  Paramedics advise that she be seen at earliest appt with MD.  Appt made with Dr Jenny Reichmann for 5/13 at [facility].  Pt denies COVID symptoms & believes her chest pain is r/t reflux as CP was relieved with Tums.

## 2020-07-18 ENCOUNTER — Other Ambulatory Visit: Payer: Self-pay

## 2020-07-18 ENCOUNTER — Encounter: Payer: Self-pay | Admitting: Internal Medicine

## 2020-07-18 ENCOUNTER — Ambulatory Visit: Payer: Medicare PPO | Admitting: Internal Medicine

## 2020-07-18 VITALS — BP 172/90 | HR 61 | Temp 99.1°F | Ht 62.0 in | Wt 253.0 lb

## 2020-07-18 DIAGNOSIS — K219 Gastro-esophageal reflux disease without esophagitis: Secondary | ICD-10-CM | POA: Diagnosis not present

## 2020-07-18 DIAGNOSIS — I10 Essential (primary) hypertension: Secondary | ICD-10-CM | POA: Diagnosis not present

## 2020-07-18 DIAGNOSIS — R079 Chest pain, unspecified: Secondary | ICD-10-CM | POA: Diagnosis not present

## 2020-07-18 NOTE — Progress Notes (Deleted)
Patient ID: Carolyn Fowler, female   DOB: 1945-04-16, 75 y.o.   MRN: [id number]

## 2020-07-18 NOTE — Progress Notes (Signed)
Patient ID: Carolyn Fowler, female   DOB: 21-May-1945, 75 y.o.   MRN: [id number]        Chief Complaint: chest pain       HPI:  Carolyn Fowler is a 75 y.o. female here with c/o epigastric pain last PM after dinner, started better with tums after 15 min, and resolved after 90 min or less and no recurrence. . Mild to mod, constant, no radiation; no sob, some nausea but no vomiting, diaphoresis, palpitions or dizziness.  + fatigue.  Fried foods in the past have seemed to cause this a well, better to not do this and chewable TUMS seemed to help.  Has been on Pepcid prevoiusly but stopped sinc she seems to get by with the TUMS.  On average may happen up to once per mo depending on the diet.  ALso has regurgitation like feeling with lying down after eating, but o/w non pleuritic and non exertional. Just moved your home and meds got lost in the boxes but found again this am, so out for 4 days, but palns to restart now.   BP per EMS was 200/121 and 211/120 on may 12 when called to the home, HR in the 60s.        Wt Readings from Last 3 Encounters:  07/18/20 253 lb (114.8 kg)  07/09/20 253 lb (114.8 kg)  05/29/20 249 lb 6.4 oz (113.1 kg)   BP Readings from Last 3 Encounters:  07/18/20 (!) 172/90  07/09/20 (!) 155/93  05/29/20 136/78         Past Medical History:  Diagnosis Date  . Allergy   . Anemia   . Arthritis   . Cataract    forming   . Dysrhythmia    occ palpitations- no cardiologist  . Exposure to TB    1990's- was treated 2 x then, no s/s   . GERD (gastroesophageal reflux disease)   . History of keloid of skin   . Hypertension   . Neuromuscular disorder ([place])    right leg tumbness and tingling from herniated disc   Past Surgical History:  Procedure Laterality Date  . ABDOMINAL HYSTERECTOMY    . APPENDECTOMY    . BACK SURGERY  Aug 2013  . BACK SURGERY  Aug 2016   L3 4 5 S1  . COLONOSCOPY    . COLONOSCOPY W/ POLYPECTOMY    . KELOID EXCISION     on ear  . KNEE  ARTHROSCOPY     left  . KNEE SURGERY    . POLYPECTOMY    . SHOULDER OPEN ROTATOR CUFF REPAIR     left  . TUBAL LIGATION      reports that she has never smoked. She has never used smokeless tobacco. She reports that she does not drink alcohol and does not use drugs. family history includes Arthritis in her mother; Heart disease in her father; Lupus in her daughter, granddaughter, and niece. She was adopted. Allergies  Allergen Reactions  . Penicillins Itching, Swelling and Rash    Itched inside and out  . Eggs Or Egg-Derived Products     vomiting  . Tetracyclines & Related Nausea And Vomiting   Current Outpatient Medications on File Prior to Visit  Medication Sig Dispense Refill  . aspirin 81 MG tablet Take 81 mg by mouth daily.    . fluticasone (FLONASE) 50 MCG/ACT nasal spray Place 2 sprays into both nostrils daily. (Patient taking differently: Place 2 sprays into both nostrils as  needed.) 16 g 6  . hydrochlorothiazide (HYDRODIURIL) 25 MG tablet TAKE 1 TABLET BY MOUTH ONCE DAILY 90 tablet 2  . metoprolol tartrate (LOPRESSOR) 50 MG tablet Take 1 tablet (50 mg total) by mouth 2 (two) times daily. 180 tablet 3  . Multiple Vitamin (MULTIVITAMIN WITH MINERALS) TABS Take 1 tablet by mouth daily.    . naproxen sodium (ALEVE) 220 MG tablet Take 220 mg by mouth as needed.    . nystatin-triamcinolone ointment (MYCOLOG) Apply 1 application topically 2 (two) times daily. 100 g 3  . Propylene Glycol (SYSTANE COMPLETE OP) Apply to eye.    . Sennosides (SENOKOT PO) Take by mouth as needed.     Marland Kitchen acetaminophen (TYLENOL) 500 MG tablet Take by mouth. (Patient not taking: No sig reported)    . famotidine (PEPCID) 20 MG tablet Take 1 tablet by mouth twice daily (Patient not taking: No sig reported) 180 tablet 0   No current facility-administered medications on file prior to visit.        ROS:  All others reviewed and negative.  Objective        PE:  BP (!) 172/90 (BP Location: Left Arm, Patient  Position: Sitting, Cuff Size: Large)   Pulse 61   Temp 99.1 F (37.3 C) (Oral)   Ht 5\' 2"  (1.575 m)   Wt 253 lb (114.8 kg)   SpO2 95%   BMI 46.27 kg/m                 Constitutional: Pt appears in NAD               HENT: Head: NCAT.                Right Ear: External ear normal.                 Left Ear: External ear normal.                Eyes: . Pupils are equal, round, and reactive to light. Conjunctivae and EOM are normal               Nose: without d/c or deformity               Neck: Neck supple. Gross normal ROM               Cardiovascular: Normal rate and regular rhythm.                 Pulmonary/Chest: Effort normal and breath sounds without rales or wheezing.                Abd:  Soft, NT, ND, + BS, no organomegaly               Neurological: Pt is alert. At baseline orientation, motor grossly intact               Skin: Skin is warm. No rashes, no other new lesions, LE edema - none               Psychiatric: Pt behavior is normal without agitation   Micro: none  Cardiac tracings I have personally interpreted today:   - NSR with first degreee AVB, LAA, and NSSSTTW changes  Pertinent Radiological findings (summarize): none   Lab Results  Component Value Date   WBC 6.7 01/17/2020   HGB 13.6 01/17/2020   HCT 43.1 01/17/2020   PLT 239.0 01/17/2020   GLUCOSE 91 01/17/2020  CHOL 191 01/17/2020   TRIG 82.0 01/17/2020   HDL 57.10 01/17/2020   LDLCALC 117 (H) 01/17/2020   ALT 14 01/17/2020   AST 14 01/17/2020   NA 138 01/17/2020   K 3.9 01/17/2020   CL 100 01/17/2020   CREATININE 0.64 01/17/2020   BUN 15 01/17/2020   CO2 33 (H) 01/17/2020   TSH 1.87 09/17/2009   INR 0.96 10/21/2011   HGBA1C 5.8 01/17/2020   Assessment/Plan:  Carolyn Fowler is a 75 y.o. Black or African American [2] female with  has a past medical history of Allergy, Anemia, Arthritis, Cataract, Dysrhythmia, Exposure to TB, GERD (gastroesophageal reflux disease), History of keloid of  skin, Hypertension, and Neuromuscular disorder ([place]).  Chest pain Atypical, very low suspicion for cardiac, declines ecg /cxr for now, continue to monitor  GERD (gastroesophageal reflux disease) Mild to mod worsening, pt declines pepcid or PPI fo rnow, to continue tums prn, anti-reflux diet, to f/u any worsening symptoms or concerns   Essential hypertension Pt adamant that BP much better controlled at home on usual basis, likely reactive now uncontroled, declines med change for now, to continue to monitor at home and next visit  Followup: Return if symptoms worsen or fail to improve.  [name], MD 07/24/2020 6:46 AM [facility]

## 2020-07-18 NOTE — Patient Instructions (Signed)
Please continue all other medications as before, and refills have been done if requested.  Please have the pharmacy call with any other refills you may need.  Please continue your efforts at being more active, low cholesterol diet, and weight control.  Please keep your appointments with your specialists as you may have planned  Please check your BP at home once per day for 10 days, and let us know the average BP by doing the MyChart Messaging to your PCP or myself

## 2020-07-24 ENCOUNTER — Encounter: Payer: Self-pay | Admitting: Internal Medicine

## 2020-07-24 NOTE — Assessment & Plan Note (Signed)
Mild to mod worsening, pt declines pepcid or PPI fo rnow, to continue tums prn, anti-reflux diet, to f/u any worsening symptoms or concerns

## 2020-07-24 NOTE — Assessment & Plan Note (Signed)
Atypical, very low suspicion for cardiac, declines ecg /cxr for now, continue to monitor

## 2020-07-24 NOTE — Assessment & Plan Note (Signed)
Pt adamant that BP much better controlled at home on usual basis, likely reactive now uncontroled, declines med change for now, to continue to monitor at home and next visit

## 2020-08-11 ENCOUNTER — Other Ambulatory Visit: Payer: Self-pay

## 2020-08-11 ENCOUNTER — Ambulatory Visit: Payer: Medicare PPO | Admitting: Internal Medicine

## 2020-08-11 ENCOUNTER — Encounter: Payer: Self-pay | Admitting: Internal Medicine

## 2020-08-11 VITALS — BP 132/88 | HR 71 | Temp 98.8°F | Resp 18 | Ht 62.0 in | Wt 251.4 lb

## 2020-08-11 DIAGNOSIS — M79642 Pain in left hand: Secondary | ICD-10-CM | POA: Diagnosis not present

## 2020-08-11 DIAGNOSIS — S61411A Laceration without foreign body of right hand, initial encounter: Secondary | ICD-10-CM | POA: Diagnosis not present

## 2020-08-11 DIAGNOSIS — M79641 Pain in right hand: Secondary | ICD-10-CM

## 2020-08-11 DIAGNOSIS — Z23 Encounter for immunization: Secondary | ICD-10-CM | POA: Diagnosis not present

## 2020-08-11 MED ORDER — SULFAMETHOXAZOLE-TRIMETHOPRIM 800-160 MG PO TABS: 1.0000 | ORAL_TABLET | Freq: Two times a day (BID) | ORAL | 0 refills | Status: DC

## 2020-08-11 NOTE — Progress Notes (Signed)
   Subjective:   Patient ID: Carolyn Fowler, female    DOB: [birth year], 74 y.o.   MRN: [id number]  HPI The patient is a 75 YO female coming in for concerns about dog bite. Happened about 2 weeks ago. Still having pain in the right hand. Last Tdap 2011. Denies fevers or chills. Mild swelling from the injury. No significant pain in it.   Review of Systems  Constitutional: Negative.   HENT: Negative.   Eyes: Negative.   Respiratory: Negative for cough, chest tightness and shortness of breath.   Cardiovascular: Negative for chest pain, palpitations and leg swelling.  Gastrointestinal: Negative for abdominal distention, abdominal pain, constipation, diarrhea, nausea and vomiting.  Musculoskeletal: Negative.   Skin: Positive for wound.  Neurological: Negative.   Psychiatric/Behavioral: Negative.     Objective:  Physical Exam Constitutional:      Appearance: She is well-developed. She is obese.  HENT:     Head: Normocephalic and atraumatic.  Cardiovascular:     Rate and Rhythm: Normal rate and regular rhythm.  Pulmonary:     Effort: Pulmonary effort is normal. No respiratory distress.     Breath sounds: Normal breath sounds. No wheezing or rales.  Abdominal:     General: Bowel sounds are normal. There is no distension.     Palpations: Abdomen is soft.     Tenderness: There is no abdominal tenderness. There is no rebound.  Musculoskeletal:     Cervical back: Normal range of motion.  Skin:    General: Skin is warm and dry.     Comments: Wound right palm with secondary healing, no overt signs of infection or abscess. Sensation intact in the hand and tendon function intact right hand.  Neurological:     Mental Status: She is alert and oriented to person, place, and time.     Coordination: Coordination normal.     Vitals:   08/11/20 1435  BP: 132/88  Pulse: 71  Resp: 18  Temp: 98.8 F (37.1 C)  TempSrc: Oral  SpO2: 97%  Weight: 251 lb 6.4 oz (114 kg)  Height: 5\' 2"   (1.575 m)    This visit occurred during the SARS-CoV-2 public health emergency.  Safety protocols were in place, including screening questions prior to the visit, additional usage of staff PPE, and extensive cleaning of exam room while observing appropriate contact time as indicated for disinfecting solutions.   Assessment & Plan:  Tdap given at visit

## 2020-08-11 NOTE — Patient Instructions (Signed)
We have given you the tetanus shot today.  We have sent in bactrim to take 1 pill twice a day for 5 days.

## 2020-08-12 ENCOUNTER — Encounter: Payer: Self-pay | Admitting: Internal Medicine

## 2020-08-12 DIAGNOSIS — S61411A Laceration without foreign body of right hand, initial encounter: Secondary | ICD-10-CM | POA: Insufficient documentation

## 2020-08-12 NOTE — Assessment & Plan Note (Signed)
Rx bactrim 5 day course due to dog bite to hand. Tdap updated during visit. Advised in future to seek care sooner especially for dog bite as there is a tendency to get infected and cause functional problems.

## 2020-08-12 NOTE — Assessment & Plan Note (Signed)
Given Tdap to cover for tetanus as last 2011. Rx bactrim to cover for secondary infection as this has been about 2 weeks since injury.

## 2020-12-15 ENCOUNTER — Telehealth: Payer: Self-pay | Admitting: Internal Medicine

## 2020-12-15 NOTE — Telephone Encounter (Signed)
Left message for patient to call me back at [phone] to schedule Medicare Annual Wellness Visit   Last AWV  09/24/19  Please schedule at anytime with [facility] if patient calls the office back.    40 Minutes appointment   Any questions, please call me at [phone]

## 2021-01-20 ENCOUNTER — Other Ambulatory Visit: Payer: Self-pay

## 2021-01-20 ENCOUNTER — Ambulatory Visit (INDEPENDENT_AMBULATORY_CARE_PROVIDER_SITE_OTHER): Payer: Medicare PPO | Admitting: Internal Medicine

## 2021-01-20 ENCOUNTER — Ambulatory Visit: Payer: Medicare PPO

## 2021-01-20 ENCOUNTER — Encounter: Payer: Self-pay | Admitting: Internal Medicine

## 2021-01-20 VITALS — BP 126/80 | HR 68 | Resp 18 | Ht 62.0 in | Wt 243.2 lb

## 2021-01-20 DIAGNOSIS — K219 Gastro-esophageal reflux disease without esophagitis: Secondary | ICD-10-CM | POA: Diagnosis not present

## 2021-01-20 DIAGNOSIS — M545 Low back pain, unspecified: Secondary | ICD-10-CM

## 2021-01-20 DIAGNOSIS — G8929 Other chronic pain: Secondary | ICD-10-CM

## 2021-01-20 DIAGNOSIS — I10 Essential (primary) hypertension: Secondary | ICD-10-CM

## 2021-01-20 DIAGNOSIS — Z0000 Encounter for general adult medical examination without abnormal findings: Secondary | ICD-10-CM | POA: Diagnosis not present

## 2021-01-20 DIAGNOSIS — R200 Anesthesia of skin: Secondary | ICD-10-CM

## 2021-01-20 DIAGNOSIS — R202 Paresthesia of skin: Secondary | ICD-10-CM

## 2021-01-20 DIAGNOSIS — Z23 Encounter for immunization: Secondary | ICD-10-CM

## 2021-01-20 LAB — TSH: TSH: 1.22 u[IU]/mL (ref 0.35–5.50)

## 2021-01-20 LAB — COMPREHENSIVE METABOLIC PANEL
ALT: 17 U/L (ref 0–35)
AST: 16 U/L (ref 0–37)
Albumin: 4.1 g/dL (ref 3.5–5.2)
Alkaline Phosphatase: 72 U/L (ref 39–117)
BUN: 18 mg/dL (ref 6–23)
CO2: 30 mEq/L (ref 19–32)
Calcium: 9.7 mg/dL (ref 8.4–10.5)
Chloride: 101 mEq/L (ref 96–112)
Creatinine, Ser: 0.62 mg/dL (ref 0.40–1.20)
GFR: 87.76 mL/min (ref >60.00)
Glucose, Bld: 97 mg/dL (ref 70–99)
Potassium: 3.7 mEq/L (ref 3.5–5.1)
Sodium: 139 mEq/L (ref 135–145)
Total Bilirubin: 0.7 mg/dL (ref 0.2–1.2)
Total Protein: 7.3 g/dL (ref 6.0–8.3)

## 2021-01-20 LAB — CBC
HCT: 44.5 % (ref 36.0–46.0)
Hemoglobin: 14 g/dL (ref 12.0–15.0)
MCHC: 31.5 g/dL (ref 30.0–36.0)
MCV: 73.8 fl — ABNORMAL LOW (ref 78.0–100.0)
Platelets: 240 10*3/uL (ref 150.0–400.0)
RBC: 6.04 Mil/uL — ABNORMAL HIGH (ref 3.87–5.11)
RDW: 15.2 % (ref 11.5–15.5)
WBC: 6.9 10*3/uL (ref 4.0–10.5)

## 2021-01-20 LAB — VITAMIN B12: Vitamin B-12: 662 pg/mL (ref 211–911)

## 2021-01-20 LAB — LIPID PANEL
Cholesterol: 186 mg/dL (ref 0–200)
HDL: 53.3 mg/dL (ref >39.00)
LDL Cholesterol: 119 mg/dL — ABNORMAL HIGH (ref 0–99)
NonHDL: 132.8
Total CHOL/HDL Ratio: 3
Triglycerides: 70 mg/dL (ref 0.0–149.0)
VLDL: 14 mg/dL (ref 0.0–40.0)

## 2021-01-20 LAB — VITAMIN D 25 HYDROXY (VIT D DEFICIENCY, FRACTURES): VITD: 29.77 ng/mL — ABNORMAL LOW (ref 30.00–100.00)

## 2021-01-20 LAB — HEMOGLOBIN A1C: Hgb A1c MFr Bld: 6.1 % (ref 4.6–6.5)

## 2021-01-20 NOTE — Assessment & Plan Note (Signed)
BP at goal on hctz 25 mg daily and metoprolol 50 mg BID. Checking CMP and adjust as needed.

## 2021-01-20 NOTE — Assessment & Plan Note (Signed)
BMI 44 and complicated by GERD, HTN, back pain. She is encouraged to continue working with diet and exercise to lose weight.

## 2021-01-20 NOTE — Assessment & Plan Note (Signed)
Uses pepcid prn and tums prn.

## 2021-01-20 NOTE — Patient Instructions (Signed)
We will check the labs today. 

## 2021-01-20 NOTE — Assessment & Plan Note (Signed)
Uses otc tylenol and aleve. Checking CMP to assess renal function and okay to continue based on prior labs.

## 2021-01-20 NOTE — Assessment & Plan Note (Signed)
Flu shot given. Covid-19 booster up to date. Pneumonia complete. Shingrix 1st at pharmacy due for 2nd Jan 2023. Tetanus due 2032. Colonoscopy due 2025. Mammogram due declines, pap smear aged out and dexa complete declines further. Counseled about sun safety and mole surveillance. Counseled about the dangers of distracted driving. Given 10 year screening recommendations.

## 2021-01-20 NOTE — Assessment & Plan Note (Signed)
Present for years and mentioned today. Checking TSH and B12 and vitamin D to assess for underlying cause.

## 2021-01-20 NOTE — Progress Notes (Signed)
Subjective:   Patient ID: Carolyn Fowler, female    DOB: 07-28-1945, 75 y.o.   MRN: [id number]  HPI Here for medicare wellness and physical, no new complaints. Please see A/P for status and treatment of chronic medical problems.   Diet: heart healthy Physical activity: sedentary Depression/mood screen: negative Hearing: intact to whispered voice Visual acuity: grossly normal, performs annual eye exam  ADLs: capable Fall risk: none Home safety: good Cognitive evaluation: intact to orientation, naming, recall and repetition EOL planning: adv directives discussed, has at home did with [facility] a few years ago  [employer] from 01/20/2021 in [place] at [facility] Total Score 0        Fall Risk 07/26/2016 08/02/2017 08/24/2018 09/24/2019 07/18/2020  Falls in the past year? Yes No 0 0 1  Was there an injury with Fall? No - - 0 0  Fall Risk Category Calculator - - - 0 1  Fall Risk Category - - - Low Low  Patient Fall Risk Level - - - Low fall risk Low fall risk  Patient at Risk for Falls Due to Impaired mobility;Impaired balance/gait - Impaired balance/gait;Impaired mobility No Fall Risks -  Fall risk Follow up Falls prevention discussed - Falls prevention discussed Falls evaluation completed -  Fall risk Follow up patient had home fall safety assessment done by [facility] PT - - - -    I have personally reviewed and have noted 1. The patient's medical and social history - reviewed today no changes 2. Their use of alcohol, tobacco or illicit drugs 3. Their current medications and supplements 4. The patient's functional ability including ADL's, fall risks, home safety risks and hearing or visual impairment. 5. Diet and physical activities 6. Evidence for depression or mood disorders 7. Care team reviewed and updated 8.  The patient is not on an opioid pain medication.  Patient Care Team: Hoyt Koch, MD as PCP - General (Internal  Medicine) Past Medical History:  Diagnosis Date   Allergy    Anemia    Arthritis    Cataract    forming    Dysrhythmia    occ palpitations- no cardiologist   Exposure to TB    1990's- was treated 2 x then, no s/s    GERD (gastroesophageal reflux disease)    History of keloid of skin    Hypertension    Neuromuscular disorder ([place])    right leg tumbness and tingling from herniated disc   Past Surgical History:  Procedure Laterality Date   ABDOMINAL HYSTERECTOMY     APPENDECTOMY     BACK SURGERY  Aug 2013   BACK SURGERY  Aug 2016   L3 4 5 S1   COLONOSCOPY     COLONOSCOPY W/ POLYPECTOMY     KELOID EXCISION     on ear   KNEE ARTHROSCOPY     left   KNEE SURGERY     POLYPECTOMY     SHOULDER OPEN ROTATOR CUFF REPAIR     left   TUBAL LIGATION     Family History  Adopted: Yes  Problem Relation Age of Onset   Arthritis Mother    Heart disease Father    Lupus Daughter    Lupus Granddaughter    Lupus Niece    Review of Systems  Constitutional: Negative.   HENT: Negative.    Eyes: Negative.   Respiratory:  Negative for cough, chest tightness and shortness of breath.   Cardiovascular:  Negative for chest pain, palpitations and leg swelling.  Gastrointestinal:  Negative for abdominal distention, abdominal pain, constipation, diarrhea, nausea and vomiting.  Musculoskeletal:  Positive for arthralgias.  Skin: Negative.   Neurological: Negative.   Psychiatric/Behavioral: Negative.     Objective:  Physical Exam Constitutional:      Appearance: She is well-developed. She is obese.  HENT:     Head: Normocephalic and atraumatic.  Cardiovascular:     Rate and Rhythm: Normal rate and regular rhythm.  Pulmonary:     Effort: Pulmonary effort is normal. No respiratory distress.     Breath sounds: Normal breath sounds. No wheezing or rales.  Abdominal:     General: Bowel sounds are normal. There is no distension.     Palpations: Abdomen is soft.     Tenderness: There is no  abdominal tenderness. There is no rebound.  Musculoskeletal:     Cervical back: Normal range of motion.  Skin:    General: Skin is warm and dry.  Neurological:     Mental Status: She is alert and oriented to person, place, and time.     Coordination: Coordination normal.    Vitals:   01/20/21 0807  BP: 126/80  Pulse: 68  Resp: 18  SpO2: 95%  Weight: 243 lb 3.2 oz (110.3 kg)  Height: 5\' 2"  (1.575 m)   This visit occurred during the SARS-CoV-2 public health emergency.  Safety protocols were in place, including screening questions prior to the visit, additional usage of staff PPE, and extensive cleaning of exam room while observing appropriate contact time as indicated for disinfecting solutions.   Assessment & Plan:  Flu shot given at visit

## 2021-01-22 DIAGNOSIS — H52202 Unspecified astigmatism, left eye: Secondary | ICD-10-CM | POA: Diagnosis not present

## 2021-01-22 DIAGNOSIS — Z961 Presence of intraocular lens: Secondary | ICD-10-CM | POA: Diagnosis not present

## 2021-01-22 DIAGNOSIS — H524 Presbyopia: Secondary | ICD-10-CM | POA: Diagnosis not present

## 2021-05-06 ENCOUNTER — Other Ambulatory Visit: Payer: Self-pay | Admitting: Internal Medicine

## 2021-05-07 ENCOUNTER — Other Ambulatory Visit: Payer: Self-pay | Admitting: Internal Medicine

## 2021-07-25 ENCOUNTER — Other Ambulatory Visit: Payer: Self-pay | Admitting: Internal Medicine

## 2021-08-15 ENCOUNTER — Other Ambulatory Visit: Payer: Self-pay | Admitting: Internal Medicine

## 2021-11-10 ENCOUNTER — Other Ambulatory Visit: Payer: Self-pay | Admitting: Internal Medicine

## 2022-01-21 ENCOUNTER — Ambulatory Visit (INDEPENDENT_AMBULATORY_CARE_PROVIDER_SITE_OTHER): Payer: Medicare PPO | Admitting: Internal Medicine

## 2022-01-21 ENCOUNTER — Encounter: Payer: Self-pay | Admitting: Internal Medicine

## 2022-01-21 VITALS — BP 180/100 | HR 72 | Temp 98.4°F | Ht 62.0 in | Wt 247.0 lb

## 2022-01-21 DIAGNOSIS — Z23 Encounter for immunization: Secondary | ICD-10-CM | POA: Diagnosis not present

## 2022-01-21 DIAGNOSIS — R7303 Prediabetes: Secondary | ICD-10-CM | POA: Diagnosis not present

## 2022-01-21 DIAGNOSIS — I10 Essential (primary) hypertension: Secondary | ICD-10-CM | POA: Diagnosis not present

## 2022-01-21 DIAGNOSIS — K219 Gastro-esophageal reflux disease without esophagitis: Secondary | ICD-10-CM | POA: Diagnosis not present

## 2022-01-21 DIAGNOSIS — Z0000 Encounter for general adult medical examination without abnormal findings: Secondary | ICD-10-CM

## 2022-01-21 NOTE — Progress Notes (Signed)
Subjective:   Patient ID: Carolyn Fowler, female    DOB: 06-Nov-1945, 76 y.o.   MRN: [id number]  HPI Here for medicare wellness and physical, no new complaints. Please see A/P for status and treatment of chronic medical problems.   Diet: heart healthy Physical activity: sedentary Depression/mood screen: negative Hearing: intact to whispered voice Visual acuity: grossly normal, performs annual eye exam  ADLs: capable Fall risk: none Home safety: good Cognitive evaluation: intact to orientation, naming, recall and repetition EOL planning: adv directives discussed  [place] Visit from 01/21/2022 in [place] at [facility]  PHQ-2 Total Score [place] Office Visit from 01/21/2022 in [place] at [facility]  PHQ-9 Total Score 7         08/24/2018    8:18 AM 09/24/2019   10:10 AM 07/18/2020    8:46 AM 01/21/2022    8:27 AM 01/22/2022    8:55 AM  Fall Risk  Falls in the past year? 0 0 1 0 0  Was there an injury with Fall?  0 0 0 0  Fall Risk Category Calculator  0 1 0 0  Fall Risk Category  Low Low Low Low  Patient Fall Risk Level  Low fall risk Low fall risk  Low fall risk  Patient at Risk for Falls Due to Impaired balance/gait;Impaired mobility No Fall Risks   No Fall Risks  Fall risk Follow up Falls prevention discussed Falls evaluation completed  Falls evaluation completed Falls evaluation completed    I have personally reviewed and have noted 1. The patient's medical and social history - reviewed today no changes 2. Their use of alcohol, tobacco or illicit drugs 3. Their current medications and supplements 4. The patient's functional ability including ADL's, fall risks, home safety risks and hearing or visual impairment. 5. Diet and physical activities 6. Evidence for depression or mood disorders 7. Care team reviewed and updated 8.  The patient is not on an opioid pain medication.  Patient Care Team: Hoyt Koch, MD as PCP - General (Internal Medicine) Past Medical History:  Diagnosis Date   Allergy    Anemia    Arthritis    Cataract    forming    Dysrhythmia    occ palpitations- no cardiologist   Exposure to TB    1990's- was treated 2 x then, no s/s    GERD (gastroesophageal reflux disease)    History of keloid of skin    Hypertension    Neuromuscular disorder ([place])    right leg tumbness and tingling from herniated disc   Past Surgical History:  Procedure Laterality Date   ABDOMINAL HYSTERECTOMY     APPENDECTOMY     BACK SURGERY  Aug 2013   BACK SURGERY  Aug 2016   L3 4 5 S1   COLONOSCOPY     COLONOSCOPY W/ POLYPECTOMY     KELOID EXCISION     on ear   KNEE ARTHROSCOPY     left   KNEE SURGERY     POLYPECTOMY     SHOULDER OPEN ROTATOR CUFF REPAIR     left   TUBAL LIGATION     Family History  Adopted: Yes  Problem Relation Age of Onset   Arthritis Mother    Heart disease Father    Lupus Daughter    Lupus Granddaughter    Lupus Niece    Review of Systems  Constitutional: Negative.  HENT: Negative.    Eyes: Negative.   Respiratory:  Negative for cough, chest tightness and shortness of breath.   Cardiovascular:  Negative for chest pain, palpitations and leg swelling.  Gastrointestinal:  Negative for abdominal distention, abdominal pain, constipation, diarrhea, nausea and vomiting.  Musculoskeletal: Negative.   Skin:  Positive for color change.  Neurological: Negative.   Psychiatric/Behavioral: Negative.      Objective:  Physical Exam Constitutional:      Appearance: She is well-developed.  HENT:     Head: Normocephalic and atraumatic.  Cardiovascular:     Rate and Rhythm: Normal rate and regular rhythm.  Pulmonary:     Effort: Pulmonary effort is normal. No respiratory distress.     Breath sounds: Normal breath sounds. No wheezing or rales.  Abdominal:     General: Bowel sounds are normal. There is no distension.     Palpations: Abdomen is  soft.     Tenderness: There is no abdominal tenderness. There is no rebound.  Musculoskeletal:     Cervical back: Normal range of motion.  Skin:    General: Skin is warm and dry.     Comments: Several toenails dark  Neurological:     Mental Status: She is alert and oriented to person, place, and time.     Coordination: Coordination normal.     Vitals:   01/21/22 0820 01/21/22 0826 01/21/22 0901  BP: (!) 180/100 (!) 180/100 (!) 180/100  Pulse: 72    Temp: 98.4 F (36.9 C)    TempSrc: Oral    SpO2: 96%    Weight: 247 lb (112 kg)    Height: '5\' 2"'$  (1.575 m)      Assessment & Plan:  Flu shot given at visit

## 2022-01-22 ENCOUNTER — Other Ambulatory Visit: Payer: Medicare PPO

## 2022-01-22 ENCOUNTER — Encounter: Payer: Self-pay | Admitting: Internal Medicine

## 2022-01-22 ENCOUNTER — Ambulatory Visit: Payer: Medicare PPO | Admitting: *Deleted

## 2022-01-22 VITALS — Ht 62.0 in | Wt 247.0 lb

## 2022-01-22 DIAGNOSIS — R7303 Prediabetes: Secondary | ICD-10-CM | POA: Insufficient documentation

## 2022-01-22 DIAGNOSIS — Z0000 Encounter for general adult medical examination without abnormal findings: Secondary | ICD-10-CM

## 2022-01-22 DIAGNOSIS — Z1231 Encounter for screening mammogram for malignant neoplasm of breast: Secondary | ICD-10-CM

## 2022-01-22 LAB — LIPID PANEL
Cholesterol: 159 mg/dL (ref 0–200)
HDL: 49.2 mg/dL (ref >39.00)
LDL Cholesterol: 93 mg/dL (ref 0–99)
NonHDL: 109.45
Total CHOL/HDL Ratio: 3
Triglycerides: 84 mg/dL (ref 0.0–149.0)
VLDL: 16.8 mg/dL (ref 0.0–40.0)

## 2022-01-22 LAB — CBC
HCT: 43.5 % (ref 36.0–46.0)
Hemoglobin: 13.9 g/dL (ref 12.0–15.0)
MCHC: 32 g/dL (ref 30.0–36.0)
MCV: 74 fl — ABNORMAL LOW (ref 78.0–100.0)
Platelets: 201 10*3/uL (ref 150.0–400.0)
RBC: 5.88 Mil/uL — ABNORMAL HIGH (ref 3.87–5.11)
RDW: 15.1 % (ref 11.5–15.5)
WBC: 5.9 10*3/uL (ref 4.0–10.5)

## 2022-01-22 LAB — COMPREHENSIVE METABOLIC PANEL
ALT: 14 U/L (ref 0–35)
AST: 14 U/L (ref 0–37)
Albumin: 4 g/dL (ref 3.5–5.2)
Alkaline Phosphatase: 61 U/L (ref 39–117)
BUN: 17 mg/dL (ref 6–23)
CO2: 33 mEq/L — ABNORMAL HIGH (ref 19–32)
Calcium: 9.3 mg/dL (ref 8.4–10.5)
Chloride: 99 mEq/L (ref 96–112)
Creatinine, Ser: 0.68 mg/dL (ref 0.40–1.20)
GFR: 85.23 mL/min (ref >60.00)
Glucose, Bld: 99 mg/dL (ref 70–99)
Potassium: 4 mEq/L (ref 3.5–5.1)
Sodium: 139 mEq/L (ref 135–145)
Total Bilirubin: 0.8 mg/dL (ref 0.2–1.2)
Total Protein: 7 g/dL (ref 6.0–8.3)

## 2022-01-22 LAB — HEMOGLOBIN A1C: Hgb A1c MFr Bld: 5.9 % (ref 4.6–6.5)

## 2022-01-22 NOTE — Assessment & Plan Note (Signed)
Checking HgA1c and adjust as needed.  

## 2022-01-22 NOTE — Progress Notes (Signed)
Subjective:   Carolyn Fowler is a 76 y.o. female who presents for Medicare Annual (Subsequent) preventive examination. I connected with  Carolyn Fowler on 01/22/22 by a audio enabled telemedicine application and verified that I am speaking with the correct person using two identifiers.  Patient Location: Home  Provider Location: Office/Clinic  I discussed the limitations of evaluation and management by telemedicine. The patient expressed understanding and agreed to proceed.   Review of Systems    Defer to PCP Cardiac Risk Factors include: advanced age (>32mn, >>22women);obesity (BMI >30kg/m2);hypertension  Please see  problem list for additional risk factors     Objective:    Today's Vitals   01/22/22 0855  Weight: 247 lb (112 kg)  Height: '5\' 2"'$  (1.575 m)   Body mass index is 45.18 kg/m.     01/22/2022    9:00 AM 09/24/2019    9:59 AM 08/24/2018    8:15 AM 08/02/2017   12:12 PM 07/26/2016    8:36 AM 04/25/2015    9:59 AM 04/11/2015    1:28 PM  Advanced Directives  Does Patient Have a Medical Advance Directive? Yes Yes No Yes Yes No Yes  Type of AParamedicof [place] LakeLiving will  HN[place] CenterLiving will [place]Living will  [place]ark[place] Prairie[place] will  Does patient want to make changes to medical advance directive?  No - Patient declined Yes (MAU/Ambulatory/Procedural Areas - Information given)    No - Patient declined  Copy of [place] Chart?  No - copy requested  No - copy requested No - copy requested  No - copy requested  Would patient like information on creating a medical advance directive? No - Patient declined          Current Medications (verified) Outpatient Encounter Medications as of 01/22/2022  Medication Sig   acetaminophen (TYLENOL) 500 MG tablet Take by mouth.   aspirin 81 MG tablet Take 81 mg by mouth daily.    famotidine (PEPCID) 20 MG tablet Take 1 tablet by mouth twice daily   fluticasone (FLONASE) 50 MCG/ACT nasal spray Place 2 sprays into both nostrils daily. (Patient taking differently: Place 2 sprays into both nostrils as needed.)   hydrochlorothiazide (HYDRODIURIL) 25 MG tablet TAKE 1 TABLET BY MOUTH ONCE DAILY Annual appt due in Nov must see provider for future refills   metoprolol tartrate (LOPRESSOR) 50 MG tablet Take 1 tablet by mouth twice daily   Multiple Vitamin (MULTIVITAMIN WITH MINERALS) TABS Take 1 tablet by mouth daily.   nystatin-triamcinolone ointment (MYCOLOG) Apply 1 application topically 2 (two) times daily.   Propylene Glycol (SYSTANE COMPLETE OP) Apply to eye.   Sennosides (SENOKOT PO) Take by mouth as needed.    [DISCONTINUED] naproxen sodium (ALEVE) 220 MG tablet Take 220 mg by mouth as needed. (Patient not taking: Reported on 01/21/2022)   No facility-administered encounter medications on file as of 01/22/2022.    Allergies (verified) Penicillins, Eggs or egg-derived products, and Tetracyclines & related   History: Past Medical History:  Diagnosis Date   Allergy    Anemia    Arthritis    Cataract    forming    Dysrhythmia    occ palpitations- no cardiologist   Exposure to TB    178's was treated 2 x then, no s/s    GERD (gastroesophageal reflux disease)    History of keloid of skin    Hypertension  Neuromuscular disorder ([place])    right leg tumbness and tingling from herniated disc   Past Surgical History:  Procedure Laterality Date   ABDOMINAL HYSTERECTOMY     APPENDECTOMY     BACK SURGERY  Aug 2013   BACK SURGERY  Aug 2016   L3 4 5 S1   COLONOSCOPY     COLONOSCOPY W/ POLYPECTOMY     KELOID EXCISION     on ear   KNEE ARTHROSCOPY     left   KNEE SURGERY     POLYPECTOMY     SHOULDER OPEN ROTATOR CUFF REPAIR     left   TUBAL LIGATION     Family History  Adopted: Yes  Problem Relation Age of Onset   Arthritis Mother    Heart disease  Father    Lupus Daughter    Lupus Granddaughter    Lupus Niece    Social History   Socioeconomic History   Marital status: Widowed    Spouse name: Not on file   Number of children: 5   Years of education: 82   Highest education level: Not on file  Occupational History   Occupation: Chemical engineer    Comment: retired  Tobacco Use   Smoking status: Never   Smokeless tobacco: Never  Vaping Use   Vaping Use: Never used  Substance and Sexual Activity   Alcohol use: No   Drug use: No   Sexual activity: Never  Other Topics Concern   Not on file  Social History Narrative   HS[employer], [employer] BA, Master's degree vocational ed. Married - '66- '79/divorced; married '04   2 sons - '67, '74; 3 daughters - '71, '74, '74; 15 grandchilderns; 3 great-grandchildren. Work - retired from [employer] officer. Marriage in good health.    Social Determinants of Health   Financial Resource Strain: Low Risk  (01/22/2022)   Overall Financial Resource Strain (CARDIA)    Difficulty of Paying Living Expenses: Not hard at all  Food Insecurity: No Food Insecurity (01/22/2022)   Hunger Vital Sign    Worried About Running Out of Food in the Last Year: Never true    Ran Out of Food in the Last Year: Never true  Transportation Needs: No Transportation Needs (01/22/2022)   PRAPARE - Hydrologist (Medical): No    Lack of Transportation (Non-Medical): No  Physical Activity: Inactive (01/22/2022)   Exercise Vital Sign    Days of Exercise per Week: 0 days    Minutes of Exercise per Session: 0 min  Stress: No Stress Concern Present (01/22/2022)   [place]    Feeling of Stress : Not at all  Social Connections: Moderately Isolated (01/22/2022)   Social Connection and Isolation Panel [NHANES]    Frequency of Communication with Friends and Family: More than three times a week    Frequency of Social Gatherings with  Friends and Family: Three times a week    Attends Religious Services: More than 4 times per year    Active Member of Clubs or Organizations: No    Attends Archivist Meetings: Never    Marital Status: Widowed    Tobacco Counseling Counseling given: Not Answered   Clinical Intake:  Pre-visit preparation completed: Yes  Pain : No/denies pain     Nutritional Status: BMI > 30  Obese Nutritional Risks: None Diabetes: No  How often do you need to have someone help you when  you read instructions, pamphlets, or other written materials from your doctor or pharmacy?: 1 - Never What is the last grade level you completed in school?: master degree  Diabetic?no  Interpreter Needed?: No      Activities of Daily Living    01/22/2022    9:05 AM 01/22/2022    8:58 AM  In your present state of health, do you have any difficulty performing the following activities:  Hearing? 0   Vision? 0   Difficulty concentrating or making decisions? 0   Walking or climbing stairs? 0   Dressing or bathing? 0   Doing errands, shopping? 0   Preparing Food and eating ?  N  Using the Toilet?  N  In the past six months, have you accidently leaked urine?  N  Do you have problems with loss of bowel control?  N  Managing your Medications?  N  Managing your Finances?  N  Housekeeping or managing your Housekeeping?  N    Patient Care Team: Hoyt Koch, MD as PCP - General (Internal Medicine)  Indicate any recent Medical Services you may have received from other than [facility] providers in the past year (date may be approximate).     Assessment:   This is a routine wellness examination for Rhiana.  Hearing/Vision screen Hearing Screening - Comments:: Patient states she hears well Vision Screening - Comments:: Appointment yearly with Dr. Gershon Crane  Dietary issues and exercise activities discussed: Current Exercise Habits: Home exercise routine, Type of exercise: walking, Time  (Minutes): 10, Frequency (Times/Week): 3, Weekly Exercise (Minutes/Week): 30, Intensity: Mild, Exercise limited by: None identified   Goals Addressed   None   Depression Screen    01/22/2022    8:55 AM 01/21/2022    8:40 AM 01/20/2021    8:08 AM 09/24/2019   10:19 AM 08/24/2018    8:18 AM 08/02/2017    8:29 AM 11/22/2016    9:38 AM  PHQ 2/9 Scores  PHQ - 2 Score 0 0 0 0 0 0 0  PHQ- 9 Score  7         Fall Risk    01/22/2022    8:55 AM 01/21/2022    8:27 AM 07/18/2020    8:46 AM 09/24/2019   10:10 AM 08/24/2018    8:18 AM  Fall Risk   Falls in the past year? 0 0 1 0 0  Number falls in past yr: 0 0 0 0 0  Injury with Fall? 0 0 0 0   Risk for fall due to : No Fall Risks   No Fall Risks Impaired balance/gait;Impaired mobility  Follow up Falls evaluation completed Falls evaluation completed  Falls evaluation completed Falls prevention discussed    FALL RISK PREVENTION PERTAINING TO THE HOME:  Any stairs in or around the home? Yes  If so, are there any without handrails? Yes  Home free of loose throw rugs in walkways, pet beds, electrical cords, etc? Yes  Adequate lighting in your home to reduce risk of falls? Yes   ASSISTIVE DEVICES UTILIZED TO PREVENT FALLS:  Life alert? No  Use of a cane, walker or w/c? Yes  Grab bars in the bathroom? Yes  Shower chair or bench in shower? Yes  Elevated toilet seat or a handicapped toilet? Yes   TIMED UP AND GO:  Was the test performed? No .  Length of time to ambulate 10 feet: n/a sec.     Cognitive Function:    08/02/2017  8:31 AM  MMSE - Mini Mental State Exam  Orientation to time 5  Orientation to Place 5  Registration 3  Attention/ Calculation 5  Recall 3  Language- name 2 objects 2  Language- repeat 1  Language- follow 3 step command 3  Language- read & follow direction 1  Write a sentence 1  Copy design 1  Total score 30        01/22/2022    8:56 AM 09/24/2019   10:23 AM  6CIT Screen  What Year? 0 points 0  points  What month? 0 points 0 points  What time? 0 points 3 points  Count back from 20 0 points 0 points  Months in reverse 0 points 0 points  Repeat phrase 0 points 0 points  Total Score 0 points 3 points    Immunizations Immunization History  Administered Date(s) Administered   Fluad Quad(high Dose 65+) 12/14/2018, 12/19/2019, 01/20/2021, 01/21/2022   Moderna Sars-Covid-2 Vaccination 04/30/2019, 05/29/2019, 01/06/2020   Pneumococcal Conjugate-13 02/18/2015   Pneumococcal Polysaccharide-23 07/01/2011, 09/15/2017   Td 09/23/2009   Tdap 08/11/2020   Zoster Recombinat (Shingrix) 03/14/2021    TDAP status: Up to date  Flu Vaccine status: Up to date  Pneumococcal vaccine status: Up to date  Covid-19 vaccine status: Information provided on how to obtain vaccines.   Qualifies for Shingles Vaccine? Yes   Zostavax completed No   Shingrix Completed?: No.    Education has been provided regarding the importance of this vaccine. Patient has been advised to call insurance company to determine out of pocket expense if they have not yet received this vaccine. Advised may also receive vaccine at local pharmacy or Health Dept. Verbalized acceptance and understanding.  Screening Tests Health Maintenance  Topic Date Due   COVID-19 Vaccine (4 - Moderna series) 03/02/2020   Zoster Vaccines- Shingrix (2 of 2) 05/09/2021   Medicare Annual Wellness (AWV)  01/23/2023   COLONOSCOPY (Pts 45-55yr Insurance coverage will need to be confirmed)  12/08/2023   TETANUS/TDAP  08/12/2030   Pneumonia Vaccine 75 Years old  Completed   INFLUENZA VACCINE  Completed   DEXA SCAN  Completed   Hepatitis C Screening  Completed   HPV VACCINES  Aged Out    Health Maintenance  Health Maintenance Due  Topic Date Due   COVID-19 Vaccine (4 - Moderna series) 03/02/2020   Zoster Vaccines- Shingrix (2 of 2) 05/09/2021    Colorectal cancer screening: Type of screening: Colonoscopy. Completed 12/08/2018. Repeat  every 5 years  Mammogram status: Ordered 01/22/2022. Pt provided with contact info and advised to call to schedule appt.   Bone Density status: Completed 07/29/2014. Results reflect: Bone density results: NORMAL. Repeat every n/a years.  Lung Cancer Screening: (Low Dose CT Chest recommended if Age 75-80years, 30 pack-year currently smoking OR have quit w/in 15years.) does not qualify.   Lung Cancer Screening Referral: n/a  Additional Screening:  Hepatitis C Screening: does qualify; Completed 01/17/2020  Vision Screening: Recommended annual ophthalmology exams for early detection of glaucoma and other disorders of the eye. Is the patient up to date with their annual eye exam?  Yes  Who is the provider or what is the name of the office in which the patient attends annual eye exams? Dr. SGershon CraneIf pt is not established with a provider, would they like to be referred to a provider to establish care? No .   Dental Screening: Recommended annual dental exams for proper oral hygiene  Community Resource Referral /  Chronic Care Management: CRR required this visit?  No   CCM required this visit?  No      Plan:     I have personally reviewed and noted the following in the patient's chart:   Medical and social history Use of alcohol, tobacco or illicit drugs  Current medications and supplements including opioid prescriptions. Patient is not currently taking opioid prescriptions. Functional ability and status Nutritional status Physical activity Advanced directives List of other physicians Hospitalizations, surgeries, and ER visits in previous 12 months Vitals Screenings to include cognitive, depression, and falls Referrals and appointments  In addition, I have reviewed and discussed with patient certain preventive protocols, quality metrics, and best practice recommendations. A written personalized care plan for preventive services as well as general preventive health  recommendations were provided to patient.     Mylinda Latina, CMA   01/22/2022  Non face to face 30 minutes  Nurse Notes:  Ms. Colley , Thank you for taking time to come for your Medicare Wellness Visit. I appreciate your ongoing commitment to your health goals. Please review the following plan we discussed and let me know if I can assist you in the future.   These are the goals we discussed:  Goals       Patient Stated      I am going to be more committed to going to [employer] and choosing the right foods to eat. Enjoy life, family, travel and worship God.       Patient Stated (pt-stated)      Would like to lose weight.      weight loss      Lost weight in 5 pound increments, continue to exercise, and eat healthy.         This is a list of the screening recommended for you and due dates:  Health Maintenance  Topic Date Due   COVID-19 Vaccine (4 - Moderna series) 03/02/2020   Zoster (Shingles) Vaccine (2 of 2) 05/09/2021   Medicare Annual Wellness Visit  01/23/2023   Colon Cancer Screening  12/08/2023   Tetanus Vaccine  08/12/2030   Pneumonia Vaccine  Completed   Flu Shot  Completed   DEXA scan (bone density measurement)  Completed   Hepatitis C Screening: USPSTF Recommendation to screen - Ages 66-79 yo.  Completed   HPV Vaccine  Aged Out

## 2022-01-22 NOTE — Addendum Note (Signed)
Addended by: Rae Mar on: 01/22/2022 01:41 PM   Modules accepted: Level of Service

## 2022-01-22 NOTE — Assessment & Plan Note (Signed)
Did not take meds today on time. Home readings at goal. Will keep hctz 25 mg daily and metoprolol 50 mg BID. Checking CMP and adjust as needed.

## 2022-01-22 NOTE — Assessment & Plan Note (Signed)
Flu shot given. Covid-19 counseled. Pneumonia up to date. Shingrix 1st done does not want 2nd. Tetanus up to date. Colonoscopy due 2025. Mammogram due 2025, pap smear aged out and dexa complete. Counseled about sun safety and mole surveillance. Counseled about the dangers of distracted driving. Given 10 year screening recommendations.

## 2022-01-22 NOTE — Assessment & Plan Note (Signed)
Weight stable. Counseled about diet and exercise to help.

## 2022-01-22 NOTE — Patient Instructions (Signed)

## 2022-01-22 NOTE — Assessment & Plan Note (Signed)
Taking pepcid 20 mg BID prn and doing well. Stable and will continue.

## 2022-01-25 DIAGNOSIS — H52202 Unspecified astigmatism, left eye: Secondary | ICD-10-CM | POA: Diagnosis not present

## 2022-01-25 DIAGNOSIS — Z961 Presence of intraocular lens: Secondary | ICD-10-CM | POA: Diagnosis not present

## 2022-01-25 DIAGNOSIS — H524 Presbyopia: Secondary | ICD-10-CM | POA: Diagnosis not present

## 2022-02-02 ENCOUNTER — Ambulatory Visit: Payer: Medicare PPO | Admitting: Internal Medicine

## 2022-02-02 ENCOUNTER — Encounter: Payer: Self-pay | Admitting: Internal Medicine

## 2022-02-02 ENCOUNTER — Ambulatory Visit (INDEPENDENT_AMBULATORY_CARE_PROVIDER_SITE_OTHER): Payer: Medicare PPO

## 2022-02-02 VITALS — BP 158/96 | HR 74 | Temp 98.2°F | Ht 62.0 in | Wt 244.0 lb

## 2022-02-02 DIAGNOSIS — I10 Essential (primary) hypertension: Secondary | ICD-10-CM | POA: Diagnosis not present

## 2022-02-02 DIAGNOSIS — R051 Acute cough: Secondary | ICD-10-CM

## 2022-02-02 DIAGNOSIS — R059 Cough, unspecified: Secondary | ICD-10-CM | POA: Insufficient documentation

## 2022-02-02 DIAGNOSIS — R7303 Prediabetes: Secondary | ICD-10-CM

## 2022-02-02 LAB — POC SOFIA SARS ANTIGEN FIA: SARS Coronavirus 2 Ag: NEGATIVE

## 2022-02-02 LAB — POC INFLUENZA A&B (BINAX/QUICKVUE)
Influenza A, POC: NEGATIVE
Influenza B, POC: NEGATIVE

## 2022-02-02 LAB — POCT RESPIRATORY SYNCYTIAL VIRUS: RSV Rapid Ag: NEGATIVE

## 2022-02-02 MED ORDER — LEVOFLOXACIN 500 MG PO TABS: 500.0000 mg | ORAL_TABLET | Freq: Every day | ORAL | 0 refills | Status: AC

## 2022-02-02 MED ORDER — HYDROCODONE BIT-HOMATROP MBR 5-1.5 MG/5ML PO SOLN: 5.0000 mL | Freq: Four times a day (QID) | ORAL | 0 refills | Status: AC | PRN

## 2022-02-02 NOTE — Assessment & Plan Note (Signed)
BP Readings from Last 3 Encounters:  02/02/22 (!) 158/96  01/21/22 (!) 180/100  01/20/21 126/80   Uncontrolled, likely reactive,  pt to continue medical treatment hct 25 mg qd, lopressor 50 bid

## 2022-02-02 NOTE — Assessment & Plan Note (Signed)
Lab Results  Component Value Date   HGBA1C 5.9 01/22/2022   Stable, pt to continue current medical treatment  - diet, wt control, excercise

## 2022-02-02 NOTE — Patient Instructions (Signed)
Your COVID, Flu and RSV testing was negative  Please take all new medication as prescribed -the antibiotic, cough medicine as needed  Please continue all other medications as before, and refills have been done if requested.  Please have the pharmacy call with any other refills you may need.  Please keep your appointments with your specialists as you may have planned  Please go to the XRAY Department in the first floor for the x-ray testing  You will be contacted by phone if any changes need to be made immediately.  Otherwise, you will receive a letter about your results with an explanation, but please check with MyChart first.  Please remember to sign up for MyChart if you have not done so, as this will be important to you in the future with finding out test results, communicating by private email, and scheduling acute appointments online when needed.

## 2022-02-02 NOTE — Progress Notes (Signed)
Patient ID: Carolyn Fowler, female   DOB: 03-15-1945, 76 y.o.   MRN: [id number]        Chief Complaint: follow up acute congestion, HTN, hyperglycemia       HPI:  Carolyn Fowler is a 75 y.o. female here after becoming ill the same day 5 hrs after her flu shot at last visit 11 days ago;  Here with acute onset mild to mod 2-3 days ST, HA, sinus congestion general weakness and malaise, with prod cough greenish sputum, but Pt denies chest pain, increased sob or doe, wheezing, orthopnea, PND, increased LE swelling, palpitations, dizziness or syncope.   Pt denies polydipsia, polyuria, or new focal neuro s/s.          Wt Readings from Last 3 Encounters:  02/02/22 244 lb (110.7 kg)  01/22/22 247 lb (112 kg)  01/21/22 247 lb (112 kg)   BP Readings from Last 3 Encounters:  02/02/22 (!) 158/96  01/21/22 (!) 180/100  01/20/21 126/80         Past Medical History:  Diagnosis Date   Allergy    Anemia    Arthritis    Cataract    forming    Dysrhythmia    occ palpitations- no cardiologist   Exposure to TB    1990's- was treated 2 x then, no s/s    GERD (gastroesophageal reflux disease)    History of keloid of skin    Hypertension    Neuromuscular disorder ([place])    right leg tumbness and tingling from herniated disc   Past Surgical History:  Procedure Laterality Date   ABDOMINAL HYSTERECTOMY     APPENDECTOMY     BACK SURGERY  Aug 2013   BACK SURGERY  Aug 2016   L3 4 5 S1   COLONOSCOPY     COLONOSCOPY W/ POLYPECTOMY     KELOID EXCISION     on ear   KNEE ARTHROSCOPY     left   KNEE SURGERY     POLYPECTOMY     SHOULDER OPEN ROTATOR CUFF REPAIR     left   TUBAL LIGATION      reports that she has never smoked. She has never used smokeless tobacco. She reports that she does not drink alcohol and does not use drugs. family history includes Arthritis in her mother; Heart disease in her father; Lupus in her daughter, granddaughter, and niece. She was adopted. Allergies   Allergen Reactions   Penicillins Itching, Swelling and Rash    Itched inside and out   Eggs Or Egg-Derived Products     vomiting   Tetracyclines & Related Nausea And Vomiting   Current Outpatient Medications on File Prior to Visit  Medication Sig Dispense Refill   acetaminophen (TYLENOL) 500 MG tablet Take by mouth.     aspirin 81 MG tablet Take 81 mg by mouth daily.     famotidine (PEPCID) 20 MG tablet Take 1 tablet by mouth twice daily 180 tablet 0   fluticasone (FLONASE) 50 MCG/ACT nasal spray Place 2 sprays into both nostrils daily. (Patient taking differently: Place 2 sprays into both nostrils as needed.) 16 g 6   hydrochlorothiazide (HYDRODIURIL) 25 MG tablet TAKE 1 TABLET BY MOUTH ONCE DAILY Annual appt due in Nov must see provider for future refills 90 tablet 0   metoprolol tartrate (LOPRESSOR) 50 MG tablet Take 1 tablet by mouth twice daily 180 tablet 0   Multiple Vitamin (MULTIVITAMIN WITH MINERALS) TABS Take 1 tablet by  mouth daily.     nystatin-triamcinolone ointment (MYCOLOG) Apply 1 application topically 2 (two) times daily. 100 g 3   Propylene Glycol (SYSTANE COMPLETE OP) Apply to eye.     Sennosides (SENOKOT PO) Take by mouth as needed.      No current facility-administered medications on file prior to visit.        ROS:  All others reviewed and negative.  Objective        PE:  BP (!) 158/96 (BP Location: Left Arm, Patient Position: Sitting, Cuff Size: Large)   Pulse 74   Temp 98.2 F (36.8 C) (Oral)   Ht '5\' 2"'$  (1.575 m)   Wt 244 lb (110.7 kg)   SpO2 96%   BMI 44.63 kg/m                 Constitutional: Pt appears in NAD, mild to mod ill               HENT: Head: NCAT.                Right Ear: External ear normal.                 Left Ear: External ear normal. Bilat tm's with mild erythema.  Max sinus areas non tender.  Pharynx with mild erythema, no exudate               Eyes: . Pupils are equal, round, and reactive to light. Conjunctivae and EOM are  normal               Nose: without d/c or deformity               Neck: Neck supple. Gross normal ROM but with bilateral submandibular LA, tender               Cardiovascular: Normal rate and regular rhythm.                 Pulmonary/Chest: Effort normal and breath sounds without rales or wheezing.                Neurological: Pt is alert. At baseline orientation, motor grossly intact               Skin: Skin is warm. No rashes, no other new lesions, LE edema - none               Psychiatric: Pt behavior is normal without agitation   Micro: none  Cardiac tracings I have personally interpreted today:  none  Pertinent Radiological findings (summarize): none   Lab Results  Component Value Date   WBC 5.9 01/22/2022   HGB 13.9 01/22/2022   HCT 43.5 01/22/2022   PLT 201.0 01/22/2022   GLUCOSE 99 01/22/2022   CHOL 159 01/22/2022   TRIG 84.0 01/22/2022   HDL 49.20 01/22/2022   LDLCALC 93 01/22/2022   ALT 14 01/22/2022   AST 14 01/22/2022   NA 139 01/22/2022   K 4.0 01/22/2022   CL 99 01/22/2022   CREATININE 0.68 01/22/2022   BUN 17 01/22/2022   CO2 33 (H) 01/22/2022   TSH 1.22 01/20/2021   INR 0.96 10/21/2011   HGBA1C 5.9 01/22/2022   POCT today - COVID neg, Flu A/B  - neg, and RSV - neg  Assessment/Plan:  Carolyn Fowler is a 76 y.o. Black or African American [2] female with  has a past medical history of Allergy, Anemia, Arthritis, Cataract, Dysrhythmia, Exposure  to TB, GERD (gastroesophageal reflux disease), History of keloid of skin, Hypertension, and Neuromuscular disorder ([place]).  Essential hypertension BP Readings from Last 3 Encounters:  02/02/22 (!) 158/96  01/21/22 (!) 180/100  01/20/21 126/80   Uncontrolled, likely reactive,  pt to continue medical treatment hct 25 mg qd, lopressor 50 bid   Pre-diabetes Lab Results  Component Value Date   HGBA1C 5.9 01/22/2022   Stable, pt to continue current medical treatment  - diet, wt control,  excercise   Cough Mild to mod, c/w bronchitis vs CAP, for cxr, antibx course levaquin 500 qd,, cough med prn,  to f/u any worsening symptoms or concerns  Followup: Return if symptoms worsen or fail to improve.  [name], MD 02/02/2022 12:55 PM [facility]

## 2022-02-02 NOTE — Assessment & Plan Note (Signed)
Mild to mod, c/w bronchitis vs CAP, for cxr, antibx course levaquin 500 qd,, cough med prn,  to f/u any worsening symptoms or concerns

## 2022-02-05 ENCOUNTER — Ambulatory Visit: Payer: Medicare PPO | Admitting: Internal Medicine

## 2022-02-10 ENCOUNTER — Other Ambulatory Visit: Payer: Self-pay | Admitting: Internal Medicine

## 2022-02-19 ENCOUNTER — Ambulatory Visit (INDEPENDENT_AMBULATORY_CARE_PROVIDER_SITE_OTHER): Payer: Medicare PPO

## 2022-02-19 ENCOUNTER — Ambulatory Visit: Payer: Medicare PPO | Admitting: Internal Medicine

## 2022-02-19 ENCOUNTER — Encounter: Payer: Self-pay | Admitting: Internal Medicine

## 2022-02-19 VITALS — BP 160/100 | HR 69 | Temp 97.8°F | Ht 62.0 in | Wt 243.0 lb

## 2022-02-19 DIAGNOSIS — J189 Pneumonia, unspecified organism: Secondary | ICD-10-CM

## 2022-02-19 DIAGNOSIS — I10 Essential (primary) hypertension: Secondary | ICD-10-CM | POA: Diagnosis not present

## 2022-02-19 DIAGNOSIS — I517 Cardiomegaly: Secondary | ICD-10-CM | POA: Diagnosis not present

## 2022-02-19 DIAGNOSIS — R918 Other nonspecific abnormal finding of lung field: Secondary | ICD-10-CM | POA: Diagnosis not present

## 2022-02-19 NOTE — Progress Notes (Signed)
   Subjective:   Patient ID: Carolyn Fowler, female    DOB: 11/04/1945, 76 y.o.   MRN: [id number]  HPI The patient is a 76 YO female coming in for follow up CAP. Feeling better overall still coughing some.  Review of Systems  Constitutional: Negative.   HENT:  Negative for congestion.   Eyes: Negative.   Respiratory:  Positive for cough. Negative for chest tightness and shortness of breath.   Cardiovascular:  Negative for chest pain, palpitations and leg swelling.  Gastrointestinal:  Negative for abdominal distention, abdominal pain, constipation, diarrhea, nausea and vomiting.  Musculoskeletal: Negative.   Skin: Negative.   Neurological: Negative.   Psychiatric/Behavioral: Negative.      Objective:  Physical Exam Constitutional:      Appearance: She is well-developed.  HENT:     Head: Normocephalic and atraumatic.  Cardiovascular:     Rate and Rhythm: Normal rate and regular rhythm.  Pulmonary:     Effort: Pulmonary effort is normal. No respiratory distress.     Breath sounds: Normal breath sounds. No wheezing or rales.  Abdominal:     General: Bowel sounds are normal. There is no distension.     Palpations: Abdomen is soft.     Tenderness: There is no abdominal tenderness. There is no rebound.  Musculoskeletal:     Cervical back: Normal range of motion.  Skin:    General: Skin is warm and dry.  Neurological:     Mental Status: She is alert and oriented to person, place, and time.     Coordination: Coordination normal.     Vitals:   02/19/22 1139 02/19/22 1143  BP: (!) 160/100 (!) 160/100  Pulse: 69   Temp: 97.8 F (36.6 C)   TempSrc: Oral   SpO2: 96%   Weight: 243 lb (110.2 kg)   Height: '5\' 2"'$  (1.575 m)     Assessment & Plan:

## 2022-02-19 NOTE — Assessment & Plan Note (Signed)
CXR ordered to ensure clearance. She is feeling improved cough lingering some reassurance given likely normal.

## 2022-02-19 NOTE — Assessment & Plan Note (Signed)
BP moderately elevated she was late with meds this morning and running late today. No symptoms. Continue current metoprolol 50 mg BID and hctz 25 mg daily.

## 2022-02-22 ENCOUNTER — Other Ambulatory Visit: Payer: Self-pay | Admitting: Internal Medicine

## 2022-02-22 DIAGNOSIS — R911 Solitary pulmonary nodule: Secondary | ICD-10-CM

## 2022-03-08 ENCOUNTER — Other Ambulatory Visit: Payer: Self-pay

## 2022-03-08 ENCOUNTER — Emergency Department (HOSPITAL_COMMUNITY): Payer: Medicare PPO

## 2022-03-08 ENCOUNTER — Emergency Department (HOSPITAL_BASED_OUTPATIENT_CLINIC_OR_DEPARTMENT_OTHER): Payer: Medicare PPO

## 2022-03-08 ENCOUNTER — Encounter (HOSPITAL_BASED_OUTPATIENT_CLINIC_OR_DEPARTMENT_OTHER): Payer: Self-pay | Admitting: Emergency Medicine

## 2022-03-08 ENCOUNTER — Emergency Department (HOSPITAL_BASED_OUTPATIENT_CLINIC_OR_DEPARTMENT_OTHER)
Admission: EM | Admit: 2022-03-08 | Discharge: 2022-03-08 | Disposition: A | Discharge status: Home / Self Care | Payer: Medicare PPO | Attending: Emergency Medicine | Admitting: Emergency Medicine

## 2022-03-08 DIAGNOSIS — S0003XA Contusion of scalp, initial encounter: Secondary | ICD-10-CM | POA: Insufficient documentation

## 2022-03-08 DIAGNOSIS — Y92009 Unspecified place in unspecified non-institutional (private) residence as the place of occurrence of the external cause: Secondary | ICD-10-CM | POA: Diagnosis not present

## 2022-03-08 DIAGNOSIS — W01198A Fall on same level from slipping, tripping and stumbling with subsequent striking against other object, initial encounter: Secondary | ICD-10-CM | POA: Insufficient documentation

## 2022-03-08 DIAGNOSIS — Z7982 Long term (current) use of aspirin: Secondary | ICD-10-CM | POA: Diagnosis not present

## 2022-03-08 DIAGNOSIS — S199XXA Unspecified injury of neck, initial encounter: Secondary | ICD-10-CM | POA: Diagnosis not present

## 2022-03-08 DIAGNOSIS — S0990XA Unspecified injury of head, initial encounter: Secondary | ICD-10-CM | POA: Diagnosis not present

## 2022-03-08 DIAGNOSIS — W19XXXA Unspecified fall, initial encounter: Secondary | ICD-10-CM

## 2022-03-08 NOTE — ED Triage Notes (Signed)
Slip and fall at home. Hit head on wall, dent left on wall vent. Pain/ bump on back left side of head. Pain in neck General body aches and pain. Patient takes daily ASA. Denies LOC, gcs 15 aox4  C-collar applied in triage

## 2022-03-08 NOTE — ED Provider Notes (Signed)
[facility]  Provider Note  CSN: [id number] Arrival date & time: 03/08/22 1811  History Chief Complaint  Patient presents with   Lytle Michaels    Carolyn Fowler is a 77 y.o. female brought to the ED by daughter for evaluation of head injury. She slipped and fell in her house earlier this evening hitting her head on the wall, did not have LOC, but felt dazed. Continued to have a mild headache. Had some neck soreness earlier that has resolved. No vomiting, blurry vision, weakness, numbness.    Home Medications Prior to Admission medications   Medication Sig Start Date End Date Taking? Authorizing Provider  acetaminophen (TYLENOL) 500 MG tablet Take by mouth. 10/23/14   [provider]  aspirin 81 MG tablet Take 81 mg by mouth daily.    [provider]  famotidine (PEPCID) 20 MG tablet Take 1 tablet by mouth twice daily 10/20/18   Hoyt Koch, MD  fluticasone [facility]) 50 MCG/ACT nasal spray Place 2 sprays into both nostrils daily. Patient taking differently: Place 2 sprays into both nostrils as needed. 08/24/18   Hoyt Koch, MD  hydrochlorothiazide (HYDRODIURIL) 25 MG tablet TAKE 1 TABLET BY MOUTH ONCE DAILY . APPOINTMENT IN NOV REQUIRED FOR FUTURE REFILLS 02/10/22   Hoyt Koch, MD  metoprolol tartrate (LOPRESSOR) 50 MG tablet Take 1 tablet by mouth twice daily 02/10/22   Hoyt Koch, MD  Multiple Vitamin (MULTIVITAMIN WITH MINERALS) TABS Take 1 tablet by mouth daily.    [provider]  nystatin-triamcinolone ointment (MYCOLOG) Apply 1 application topically 2 (two) times daily. 12/14/18   Hoyt Koch, MD  Propylene Glycol (SYSTANE COMPLETE OP) Apply to eye.    [provider]  Sennosides (SENOKOT PO) Take by mouth as needed.     [provider]     Allergies    Penicillins, Eggs or egg-derived products, and Tetracyclines & related   Review of Systems   Review of  Systems Please see HPI for pertinent positives and negatives  Physical Exam BP (!) 182/103 (BP Location: Left Arm)   Pulse 78   Temp 97.8 F (36.6 C)   Resp 20   SpO2 96%   Physical Exam Vitals and nursing note reviewed.  Constitutional:      Appearance: Normal appearance.  HENT:     Head: Normocephalic.     Comments: L parietal scalp hematoma    Nose: Nose normal.     Mouth/Throat:     Mouth: Mucous membranes are moist.  Eyes:     Extraocular Movements: Extraocular movements intact.     Conjunctiva/sclera: Conjunctivae normal.  Cardiovascular:     Rate and Rhythm: Normal rate.  Pulmonary:     Effort: Pulmonary effort is normal.     Breath sounds: Normal breath sounds.  Abdominal:     General: Abdomen is flat.     Palpations: Abdomen is soft.     Tenderness: There is no abdominal tenderness.  Musculoskeletal:        General: No swelling or tenderness. Normal range of motion.     Cervical back: Neck supple. No tenderness (no midline tenderness).  Skin:    General: Skin is warm and dry.  Neurological:     General: No focal deficit present.     Mental Status: She is alert.  Psychiatric:        Mood and Affect: Mood normal.     ED Results / Procedures / Treatments  EKG None  Procedures Procedures  Medications Ordered in the ED Medications - No data to display  Initial Impression and Plan  Patient here with mechanical fall and head injury. Mild headache, but otherwise no significant symptoms. I personally viewed the images from radiology studies and agree with radiologist interpretation: CT neg for acute injury. C-collar removed by NEXUS. Advised APAP for headache. Head Injury precautions discussed. PCP follow up, RTED for any other concerns.     ED Course       MDM Rules/Calculators/A&P Medical Decision Making Problems Addressed: Fall, initial encounter: acute illness or injury Injury of head, initial encounter: acute illness or injury  Amount  and/or Complexity of Data Reviewed Radiology: ordered and independent interpretation performed. Decision-making details documented in ED Course.  Risk OTC drugs.    Final Clinical Impression(s) / ED Diagnoses Final diagnoses:  Fall, initial encounter  Injury of head, initial encounter    Rx / DC Orders ED Discharge Orders     None        Truddie Hidden, MD 03/08/22 2310

## 2022-03-31 ENCOUNTER — Ambulatory Visit
Admission: RE | Admit: 2022-03-31 | Discharge: 2022-03-31 | Disposition: A | Discharge status: Home / Self Care | Payer: Medicare PPO | Source: Ambulatory Visit | Attending: Internal Medicine | Admitting: Internal Medicine

## 2022-03-31 DIAGNOSIS — R911 Solitary pulmonary nodule: Secondary | ICD-10-CM

## 2022-03-31 DIAGNOSIS — I3139 Other pericardial effusion (noninflammatory): Secondary | ICD-10-CM | POA: Diagnosis not present

## 2022-03-31 DIAGNOSIS — I700 Atherosclerosis of aorta: Secondary | ICD-10-CM | POA: Diagnosis not present

## 2022-04-01 ENCOUNTER — Ambulatory Visit
Admission: RE | Admit: 2022-04-01 | Discharge: 2022-04-01 | Disposition: A | Discharge status: Home / Self Care | Payer: Medicare PPO | Source: Ambulatory Visit | Attending: Emergency Medicine | Admitting: Emergency Medicine

## 2022-04-01 DIAGNOSIS — Z1231 Encounter for screening mammogram for malignant neoplasm of breast: Secondary | ICD-10-CM | POA: Diagnosis not present

## 2022-04-05 ENCOUNTER — Other Ambulatory Visit: Payer: Self-pay | Admitting: Internal Medicine

## 2022-04-05 DIAGNOSIS — I272 Pulmonary hypertension, unspecified: Secondary | ICD-10-CM

## 2022-04-26 ENCOUNTER — Telehealth: Payer: Self-pay | Admitting: Internal Medicine

## 2022-04-26 NOTE — Telephone Encounter (Signed)
Patient's daughter Edward Qualia  called stating that she has called Rose from [facility] [phone]) and they stated that they don't have a referral on file for her mother to see a Cardiologist. Daughter is concerned that it has already been 3 weeks since she was told that a referral would be put in for her mother and nothing has happened. Patient is requesting for the referral to be placed in her chart so an appt can be scheduled soon with cardiology.   Best callback number for patient is [phone]

## 2022-04-26 NOTE — Telephone Encounter (Signed)
Are they asking about when her echo will be done? I ordered this 04/05/22 so this should have been addressed by now. Forward that part to referrals so she does not need to see a cardiologist but get an ultrasound of the heart which we have ordered.

## 2022-04-30 ENCOUNTER — Ambulatory Visit (HOSPITAL_COMMUNITY): Payer: Medicare PPO | Attending: Internal Medicine

## 2022-04-30 DIAGNOSIS — I272 Pulmonary hypertension, unspecified: Secondary | ICD-10-CM | POA: Insufficient documentation

## 2022-04-30 LAB — ECHOCARDIOGRAM COMPLETE
Area-P 1/2: 3.5 cm2
P 1/2 time: 546 msec
S' Lateral: 3.3 cm

## 2022-05-04 ENCOUNTER — Other Ambulatory Visit: Payer: Self-pay | Admitting: Internal Medicine

## 2022-05-04 DIAGNOSIS — I272 Pulmonary hypertension, unspecified: Secondary | ICD-10-CM

## 2022-05-15 ENCOUNTER — Other Ambulatory Visit: Payer: Self-pay | Admitting: Internal Medicine

## 2022-05-18 ENCOUNTER — Other Ambulatory Visit (HOSPITAL_BASED_OUTPATIENT_CLINIC_OR_DEPARTMENT_OTHER): Payer: Medicare PPO

## 2022-05-20 ENCOUNTER — Encounter: Payer: Self-pay | Admitting: Internal Medicine

## 2022-05-25 NOTE — Progress Notes (Signed)
CARDIOLOGY CONSULT NOTE       Patient ID: Carolyn Fowler MRN: [id number] DOB/AGE: 1945/04/22 77 y.o.  Admit date: (Not on file) Referring Physician: Sharlet Salina Primary Physician: Hoyt Koch, MD Primary Cardiologist: New Reason for Consultation: ? Pulmonary HTN  Active Problems:   * No active hospital problems. *   HPI:  77 y.o. referred by Dr Sharlet Salina for ? Pulmonary HTN. History of dyspnea with exertion HTN seems to be poorly controlled looking at prior Office records Reviewed TTE from 04/30/22. Notable for LVH EF 65-70% no signs of pulmonary HTN and trivial TR unable to estimate PA pressures and only grade one diastolic dysfunction CT scan with aortic root 4.2 cm suggestion of dilated PA trunk no ILD She had congestion and was sick for a week after getting her flu shot end of 2023   The issue is not elevated PA pressures It is very poorly controlled BP. After back surgery in 2016 she was on 4 BP pills The LVH on echo likely from this She should have a sleep study to r/o OSA this is the only thing that could cause elevated PA pressures The fact that she did not have enough TR on echo to measure PA pressure means it isn't elevated and there were no morphologic signs of it in regard to [place] or D shaped septum       ROS All other systems reviewed and negative except as noted above  Past Medical History:  Diagnosis Date   Allergy    Anemia    Arthritis    Cataract    forming    Dysrhythmia    occ palpitations- no cardiologist   Exposure to TB    1990's- was treated 2 x then, no s/s    GERD (gastroesophageal reflux disease)    History of keloid of skin    Hypertension    Neuromuscular disorder (HCC)    right leg tumbness and tingling from herniated disc    Family History  Adopted: Yes  Problem Relation Age of Onset   Arthritis Mother    Heart disease Father    Lupus Daughter    Lupus Granddaughter    Lupus Niece     Social History   Socioeconomic  History   Marital status: Widowed    Spouse name: Not on file   Number of children: 5   Years of education: 64   Highest education level: Not on file  Occupational History   Occupation: Chemical engineer    Comment: retired  Tobacco Use   Smoking status: Never   Smokeless tobacco: Never  Vaping Use   Vaping Use: Never used  Substance and Sexual Activity   Alcohol use: No   Drug use: No   Sexual activity: Never  Other Topics Concern   Not on file  Social History Narrative   HS[employer], [employer] BA, Master's degree vocational ed. Married - '66- '79/divorced; married '04   2 sons - '67, '74; 3 daughters - '71, '74, '74; 15 grandchilderns; 3 great-grandchildren. Work - retired from [employer] officer. Marriage in good health.    Social Determinants of Health   Financial Resource Strain: Low Risk  (01/22/2022)   Overall Financial Resource Strain (CARDIA)    Difficulty of Paying Living Expenses: Not hard at all  Food Insecurity: No Food Insecurity (01/22/2022)   Hunger Vital Sign    Worried About Running Out of Food in the Last Year: Never true    Ran Out of Food  in the Last Year: Never true  Transportation Needs: No Transportation Needs (01/22/2022)   PRAPARE - Hydrologist (Medical): No    Lack of Transportation (Non-Medical): No  Physical Activity: Inactive (01/22/2022)   Exercise Vital Sign    Days of Exercise per Week: 0 days    Minutes of Exercise per Session: 0 min  Stress: No Stress Concern Present (01/22/2022)   [place]    Feeling of Stress : Not at all  Social Connections: Moderately Isolated (01/22/2022)   Social Connection and Isolation Panel [NHANES]    Frequency of Communication with Friends and Family: More than three times a week    Frequency of Social Gatherings with Friends and Family: Three times a week    Attends Religious Services: More than 4 times per year     Active Member of Clubs or Organizations: No    Attends Archivist Meetings: Never    Marital Status: Widowed  Intimate Partner Violence: Not At Risk (01/22/2022)   Humiliation, Afraid, Rape, and Kick questionnaire    Fear of Current or Ex-Partner: No    Emotionally Abused: No    Physically Abused: No    Sexually Abused: No    Past Surgical History:  Procedure Laterality Date   ABDOMINAL HYSTERECTOMY     APPENDECTOMY     BACK SURGERY  Aug 2013   BACK SURGERY  Aug 2016   L3 4 5 S1   COLONOSCOPY     COLONOSCOPY W/ POLYPECTOMY     KELOID EXCISION     on ear   KNEE ARTHROSCOPY     left   KNEE SURGERY     POLYPECTOMY     SHOULDER OPEN ROTATOR CUFF REPAIR     left   TUBAL LIGATION        Current Outpatient Medications:    acetaminophen (TYLENOL) 500 MG tablet, Take by mouth., Disp: , Rfl:    aspirin 81 MG tablet, Take 81 mg by mouth daily., Disp: , Rfl:    famotidine (PEPCID) 20 MG tablet, Take 1 tablet by mouth twice daily, Disp: 180 tablet, Rfl: 0   fluticasone (FLONASE) 50 MCG/ACT nasal spray, Place 2 sprays into both nostrils daily. (Patient taking differently: Place 2 sprays into both nostrils as needed.), Disp: 16 g, Rfl: 6   hydrochlorothiazide (HYDRODIURIL) 25 MG tablet, TAKE 1 TABLET BY MOUTH ONCE DAILY. APPOINTMENT IN NOV REQUIRED FOR FUTURE REFILLS., Disp: 90 tablet, Rfl: 0   metoprolol tartrate (LOPRESSOR) 50 MG tablet, Take 1 tablet by mouth twice daily, Disp: 180 tablet, Rfl: 0   Multiple Vitamin (MULTIVITAMIN WITH MINERALS) TABS, Take 1 tablet by mouth daily., Disp: , Rfl:    nystatin-triamcinolone ointment (MYCOLOG), Apply 1 application topically 2 (two) times daily., Disp: 100 g, Rfl: 3   Propylene Glycol (SYSTANE COMPLETE OP), Apply to eye., Disp: , Rfl:    Sennosides (SENOKOT PO), Take by mouth as needed. , Disp: , Rfl:     Physical Exam: There were no vitals taken for this visit.    Affect appropriate Healthy:  appears stated age 77:  normal Neck supple with no adenopathy JVP normal no bruits no thyromegaly Lungs clear with no wheezing and good diaphragmatic motion Heart:  S1/S2 no murmur, no rub, gallop or click PMI normal Abdomen: benighn, BS positve, no tenderness, no AAA no bruit.  No HSM or HJR Distal pulses intact with no bruits No edema  Neuro non-focal Skin warm and dry No muscular weakness    Labs:   Lab Results  Component Value Date   WBC 5.9 01/22/2022   HGB 13.9 01/22/2022   HCT 43.5 01/22/2022   MCV 74.0 (L) 01/22/2022   PLT 201.0 01/22/2022   No results for input(s): "NA", "K", "CL", "CO2", "BUN", "CREATININE", "CALCIUM", "PROT", "BILITOT", "ALKPHOS", "ALT", "AST", "GLUCOSE" in the last 168 hours.  Invalid input(s): "LABALBU" Lab Results  Component Value Date   CKTOTAL 75 07/09/2020    Lab Results  Component Value Date   CHOL 159 01/22/2022   CHOL 186 01/20/2021   CHOL 191 01/17/2020   Lab Results  Component Value Date   HDL 49.20 01/22/2022   HDL 53.30 01/20/2021   HDL 57.10 01/17/2020   Lab Results  Component Value Date   LDLCALC 93 01/22/2022   LDLCALC 119 (H) 01/20/2021   LDLCALC 117 (H) 01/17/2020   Lab Results  Component Value Date   TRIG 84.0 01/22/2022   TRIG 70.0 01/20/2021   TRIG 82.0 01/17/2020   Lab Results  Component Value Date   CHOLHDL 3 01/22/2022   CHOLHDL 3 01/20/2021   CHOLHDL 3 01/17/2020   No results found for: "LDLDIRECT"    Radiology: ECHOCARDIOGRAM COMPLETE  Result Date: 04/30/2022    ECHOCARDIOGRAM REPORT   Patient Name:   LEEANNE WHELAN Date of Exam: 04/30/2022 Medical Rec #:  [id number]             Height:       62.0 in Accession #:    [id number]            Weight:       243.0 lb Date of Birth:  [birth year]              BSA:          2.077 m Patient Age:    15 years              BP:           160/100 mmHg Patient Gender: F                     HR:           72 bpm. Exam Location:  [address] Procedure: 2D Echo, Cardiac Doppler and Color  Doppler Indications:    I27.20 Pulmonary hypertension  History:        Patient has no prior history of Echocardiogram examinations.                 Pulmonary HTN; Risk Factors:Hypertension and Morbid obesity.  Sonographer:    Coralyn Helling RDCS Referring Phys: [phone] ELIZABETH A CRAWFORD  Sonographer Comments: Technically difficult study due to poor echo windows, patient is obese and suboptimal parasternal window. Image acquisition challenging due to patient body habitus. IMPRESSIONS  1. In short axis diastolic hypertrohpy ~ 2 cm. Left ventricular ejection fraction, by estimation, is 65 to 70%. The left ventricle has normal function. Left ventricular endocardial border not optimally defined to evaluate regional wall motion. There is severe concentric left ventricular hypertrophy. Left ventricular diastolic parameters are consistent with Grade I diastolic dysfunction (impaired relaxation).  2. Right ventricular systolic function is hyperdynamic. The right ventricular size is mildly enlarged. Tricuspid regurgitation signal is inadequate for assessing PA pressure.  3. Left atrial size was severely dilated.  4. Right atrial size was mildly dilated.  5. A small pericardial effusion is present. The pericardial  effusion is posterior to the left ventricle.  6. The mitral valve is abnormal. No evidence of mitral valve regurgitation.  7. The aortic valve is tricuspid. There is mild calcification of the aortic valve. Aortic valve regurgitation is mild to moderate.  8. Aortic dilatation noted. There is mild dilatation of the aortic root, measuring 43 mm. There is mild dilatation of the ascending aorta, measuring 44 mm.  9. Cannot exclude a small PFO. Comparison(s): No prior Echocardiogram. FINDINGS  Left Ventricle: In short axis diastolic hypertrohpy ~ 2 cm. Left ventricular ejection fraction, by estimation, is 65 to 70%. The left ventricle has normal function. Left ventricular endocardial border not optimally defined to  evaluate regional wall motion. The left ventricular internal cavity size was normal in size. There is severe concentric left ventricular hypertrophy. Left ventricular diastolic parameters are consistent with Grade I diastolic dysfunction (impaired relaxation). Right Ventricle: The right ventricular size is mildly enlarged. No increase in right ventricular wall thickness. Right ventricular systolic function is hyperdynamic. Tricuspid regurgitation signal is inadequate for assessing PA pressure. Left Atrium: Left atrial size was severely dilated. Right Atrium: Right atrial size was mildly dilated. Pericardium: A small pericardial effusion is present. The pericardial effusion is posterior to the left ventricle. Mitral Valve: The mitral valve is abnormal. No evidence of mitral valve regurgitation. Tricuspid Valve: The tricuspid valve is grossly normal. Tricuspid valve regurgitation is mild . No evidence of tricuspid stenosis. Aortic Valve: Eccentric aortic regurgitation; difficult severity assessment due to rotation of the cardiac silhouette. The aortic valve is tricuspid. There is mild calcification of the aortic valve. Aortic valve regurgitation is mild to moderate. Aortic regurgitation PHT measures 546 msec. Pulmonic Valve: The pulmonic valve was grossly normal. Pulmonic valve regurgitation is trivial. No evidence of pulmonic stenosis. Aorta: Aortic dilatation noted. There is mild dilatation of the aortic root, measuring 43 mm. There is mild dilatation of the ascending aorta, measuring 44 mm. IAS/Shunts: Cannot exclude a small PFO.  LEFT VENTRICLE PLAX 2D LVIDd:         5.10 cm   Diastology LVIDs:         3.30 cm   LV e' medial:    5.00 cm/s LV PW:         1.60 cm   LV E/e' medial:  7.8 LV IVS:        1.30 cm   LV e' lateral:   8.81 cm/s LVOT diam:     2.30 cm   LV E/e' lateral: 4.4 LV SV:         71 LV SV Index:   34 LVOT Area:     4.15 cm  IVC IVC diam: 1.40 cm LEFT ATRIUM              Index        RIGHT ATRIUM            Index LA diam:        5.00 cm  2.41 cm/m   RA Pressure: 3.00 mmHg LA Vol (A2C):   88.5 ml  42.62 ml/m  RA Area:     22.00 cm LA Vol (A4C):   126.0 ml 60.67 ml/m  RA Volume:   59.20 ml  28.51 ml/m LA Biplane Vol: 104.0 ml 50.08 ml/m  AORTIC VALVE LVOT Vmax:   87.80 cm/s LVOT Vmean:  59.500 cm/s LVOT VTI:    0.172 m AI PHT:      546 msec  AORTA Ao Root diam: 4.30 cm  Ao Asc diam:  4.40 cm MITRAL VALVE                TRICUSPID VALVE MV Area (PHT): 3.50 cm     Estimated RAP:  3.00 mmHg MV Decel Time: 217 msec MV E velocity: 38.80 cm/s   SHUNTS MV A velocity: 129.00 cm/s  Systemic VTI:  0.17 m MV E/A ratio:  0.30         Systemic Diam: 2.30 cm Rudean Haskell MD Electronically signed by Rudean Haskell MD Signature Date/Time: 04/30/2022/4:40:25 PM    Final     EKG: SR rate 61 PR 212 msecc LAD 07/18/20   ASSESSMENT AND PLAN:   Dyspnea: ? Pulmonary hypertension with dilated PA trunk on CT EF hypderdynamic with LVH and only grade one diastolic dysfunction She is overweight and large breasted with likely some component of restrictive lung dx as evidence of dyspnea bending over. She needs sleep study should be ordered by primary Will check BNP  HTN:  seems poorly controlled with LVH up to 2 cm in spetum Continue diuretic and beta blocker Add ARB Suggest cardiac MRI to r/o infiltrative disease  Fact that her ECG does not have voltage for LVH also concerning regarding amyloid or infiltrative dx  Discussed issues with multiple family members today   Cardiac MRI BNP/BMET Diovan 320 mg daily F/U Pharm D in BP clinic in 6-8 wees F/U with me in 3 months     Signed: Jenkins Rouge 05/25/2022, 8:39 AM

## 2022-05-31 ENCOUNTER — Encounter: Payer: Self-pay | Admitting: Cardiovascular Disease

## 2022-05-31 ENCOUNTER — Ambulatory Visit: Payer: Medicare PPO | Attending: Cardiovascular Disease | Admitting: Cardiovascular Disease

## 2022-05-31 VITALS — BP 199/99 | HR 73 | Ht 62.0 in | Wt 240.0 lb

## 2022-05-31 DIAGNOSIS — I10 Essential (primary) hypertension: Secondary | ICD-10-CM

## 2022-05-31 DIAGNOSIS — I517 Cardiomegaly: Secondary | ICD-10-CM

## 2022-05-31 DIAGNOSIS — R0600 Dyspnea, unspecified: Secondary | ICD-10-CM | POA: Diagnosis not present

## 2022-05-31 MED ORDER — METOPROLOL TARTRATE 50 MG PO TABS: 50.0000 mg | ORAL_TABLET | Freq: Two times a day (BID) | ORAL | 3 refills | Status: DC

## 2022-05-31 MED ORDER — HYDROCHLOROTHIAZIDE 25 MG PO TABS: 25.0000 mg | ORAL_TABLET | Freq: Every day | ORAL | 3 refills | Status: DC

## 2022-05-31 MED ORDER — VALSARTAN 320 MG PO TABS: 320.0000 mg | ORAL_TABLET | Freq: Every day | ORAL | 3 refills | Status: DC

## 2022-05-31 NOTE — Patient Instructions (Addendum)
Medication Instructions:  Your physician has recommended you make the following change in your medication:  1-START DIOVAN 320 MG BY MOUTH DAILY.  *If you need a refill on your cardiac medications before your next appointment, please call your pharmacy*  Lab Work: Your physician recommends that you lab work today. BMET and BNP  If you have labs (blood work) drawn today and your tests are completely normal, you will receive your results only by: [place] (if you have MyChart) OR A paper copy in the mail If you have any lab test that is abnormal or we need to change your treatment, we will call you to review the results.  Testing/Procedures: Your physician has requested that you have a cardiac MRI. Cardiac MRI uses a computer to create images of your heart as its beating, producing both still and moving pictures of your heart and major blood vessels. For further information please visit [email or web address] Please follow the instruction sheet given to you today for more information.   Follow-Up: At [facility], you and your health needs are our priority.  As part of our continuing mission to provide you with exceptional heart care, we have created designated Provider Care Teams.  These Care Teams include your primary Cardiologist (physician) and Advanced Practice Providers (APPs -  Physician Assistants and Nurse Practitioners) who all work together to provide you with the care you need, when you need it.  We recommend signing up for the patient portal called "MyChart".  Sign up information is provided on this After Visit Summary.  MyChart is used to connect with patients for Virtual Visits (Telemedicine).  Patients are able to view lab/test results, encounter notes, upcoming appointments, etc.  Non-urgent messages can be sent to your provider as well.   To learn more about what you can do with MyChart, go to [email or web address].    Your next appointment:   3  MONTHS  Provider:   DR. Blima Singer have been referred to [place] 6 TO 8 WEEKS

## 2022-06-01 LAB — BASIC METABOLIC PANEL
BUN/Creatinine Ratio: 22 (ref 12–28)
BUN: 14 mg/dL (ref 8–27)
CO2: 27 mmol/L (ref 20–29)
Calcium: 10 mg/dL (ref 8.7–10.3)
Chloride: 100 mmol/L (ref 96–106)
Creatinine, Ser: 0.64 mg/dL (ref 0.57–1.00)
Glucose: 88 mg/dL (ref 70–99)
Potassium: 4 mmol/L (ref 3.5–5.2)
Sodium: 143 mmol/L (ref 134–144)
eGFR: 92 mL/min/{1.73_m2} (ref >59)

## 2022-06-01 LAB — PRO B NATRIURETIC PEPTIDE: NT-Pro BNP: 253 pg/mL (ref 0–738)

## 2022-06-01 NOTE — Addendum Note (Signed)
Addended by: Daiva Huge on: 06/01/2022 09:01 AM   Modules accepted: Orders

## 2022-06-08 NOTE — Progress Notes (Signed)
Subjective:   Carolyn Fowler is a 77 y.o. female who presents for Medicare Annual (Subsequent) preventive examination. I connected with  Carolyn Fowler on 06/08/22 by a audio enabled telemedicine application and verified that I am speaking with the correct person using two identifiers.  Patient Location: Home  Provider Location: Office/Clinic  I discussed the limitations of evaluation and management by telemedicine. The patient expressed understanding and agreed to proceed.   Review of Systems    Defer to PCP Cardiac Risk Factors include: advanced age (>63men, >42 women);obesity (BMI >30kg/m2);hypertension  Please see  problem list for additional risk factors     Objective:    Today's Vitals   01/22/22 0855  Weight: 247 lb (112 kg)  Height: 5\' 2"  (1.575 m)   Body mass index is 45.18 kg/m.     03/08/2022    6:56 PM 01/22/2022    9:00 AM 09/24/2019    9:59 AM 08/24/2018    8:15 AM 08/02/2017   12:12 PM 07/26/2016    8:36 AM 04/25/2015    9:59 AM  Advanced Directives  Does Patient Have a Medical Advance Directive? No Yes Yes No Yes Yes No  Type of Armed forces technical officer of [place];Living will  [place];Living will [place];Living will   Does patient want to make changes to medical advance directive?   No - Patient declined Yes (MAU/Ambulatory/Procedural Areas - Information given)     Copy of [place] in Chart?   No - copy requested  No - copy requested No - copy requested   Would patient like information on creating a medical advance directive? No - Patient declined No - Patient declined         Current Medications (verified) Outpatient Encounter Medications as of 01/22/2022  Medication Sig   acetaminophen (TYLENOL) 500 MG tablet Take by mouth.   aspirin 81 MG tablet Take 81 mg by mouth daily.   famotidine (PEPCID) 20 MG tablet Take 1 tablet by mouth twice  daily   fluticasone (FLONASE) 50 MCG/ACT nasal spray Place 2 sprays into both nostrils daily. (Patient taking differently: Place 2 sprays into both nostrils as needed.)   Multiple Vitamin (MULTIVITAMIN WITH MINERALS) TABS Take 1 tablet by mouth daily.   nystatin-triamcinolone ointment (MYCOLOG) Apply 1 application topically 2 (two) times daily.   Propylene Glycol (SYSTANE COMPLETE OP) Apply to eye.   Sennosides (SENOKOT PO) Take by mouth as needed.    [DISCONTINUED] hydrochlorothiazide (HYDRODIURIL) 25 MG tablet TAKE 1 TABLET BY MOUTH ONCE DAILY Annual appt due in Nov must see provider for future refills   [DISCONTINUED] metoprolol tartrate (LOPRESSOR) 50 MG tablet Take 1 tablet by mouth twice daily   [DISCONTINUED] naproxen sodium (ALEVE) 220 MG tablet Take 220 mg by mouth as needed. (Patient not taking: Reported on 01/21/2022)   No facility-administered encounter medications on file as of 01/22/2022.    Allergies (verified) Penicillins, Egg-derived products, and Tetracyclines & related   History: Past Medical History:  Diagnosis Date   Allergy    Anemia    Arthritis    Cataract    forming    Dysrhythmia    occ palpitations- no cardiologist   Exposure to TB    26's- was treated 2 x then, no s/s    GERD (gastroesophageal reflux disease)    History of keloid of skin    Hypertension    Neuromuscular disorder ([place])  right leg tumbness and tingling from herniated disc   Past Surgical History:  Procedure Laterality Date   ABDOMINAL HYSTERECTOMY     APPENDECTOMY     BACK SURGERY  Aug 2013   BACK SURGERY  Aug 2016   L3 4 5 S1   COLONOSCOPY     COLONOSCOPY W/ POLYPECTOMY     KELOID EXCISION     on ear   KNEE ARTHROSCOPY     left   KNEE SURGERY     POLYPECTOMY     SHOULDER OPEN ROTATOR CUFF REPAIR     left   TUBAL LIGATION     Family History  Adopted: Yes  Problem Relation Age of Onset   Arthritis Mother    Heart disease Father    Lupus Daughter    Lupus  Granddaughter    Lupus Niece    Social History   Socioeconomic History   Marital status: Widowed    Spouse name: Not on file   Number of children: 5   Years of education: 12   Highest education level: Not on file  Occupational History   Occupation: Chemical engineer    Comment: retired  Tobacco Use   Smoking status: Never   Smokeless tobacco: Never  Vaping Use   Vaping Use: Never used  Substance and Sexual Activity   Alcohol use: No   Drug use: No   Sexual activity: Never  Other Topics Concern   Not on file  Social History Narrative   HS[employer], [employer] BA, Master's degree vocational ed. Married - '66- '79/divorced; married '04   2 sons - '67, '74; 3 daughters - '71, '74, '74; 15 grandchilderns; 3 great-grandchildren. Work - retired from Merchant navy officer. Marriage in good health.    Social Determinants of Health   Financial Resource Strain: Low Risk  (01/22/2022)   Overall Financial Resource Strain (CARDIA)    Difficulty of Paying Living Expenses: Not hard at all  Food Insecurity: No Food Insecurity (01/22/2022)   Hunger Vital Sign    Worried About Running Out of Food in the Last Year: Never true    Ran Out of Food in the Last Year: Never true  Transportation Needs: No Transportation Needs (01/22/2022)   PRAPARE - Hydrologist (Medical): No    Lack of Transportation (Non-Medical): No  Physical Activity: Inactive (01/22/2022)   Exercise Vital Sign    Days of Exercise per Week: 0 days    Minutes of Exercise per Session: 0 min  Stress: No Stress Concern Present (01/22/2022)   [place]    Feeling of Stress : Not at all  Social Connections: Moderately Isolated (01/22/2022)   Social Connection and Isolation Panel [NHANES]    Frequency of Communication with Friends and Family: More than three times a week    Frequency of Social Gatherings with Friends and Family: Three times a  week    Attends Religious Services: More than 4 times per year    Active Member of Clubs or Organizations: No    Attends Archivist Meetings: Never    Marital Status: Widowed    Tobacco Counseling Counseling given: Not Answered   Clinical Intake:  Pre-visit preparation completed: Yes  Pain : No/denies pain     Nutritional Status: BMI > 30  Obese Nutritional Risks: None Diabetes: No  How often do you need to have someone help you when you read instructions, pamphlets, or other  written materials from your doctor or pharmacy?: 1 - Never What is the last grade level you completed in school?: master degree  Diabetic?no  Interpreter Needed?: No      Activities of Daily Living    01/22/2022    9:05 AM 01/22/2022    8:58 AM  In your present state of health, do you have any difficulty performing the following activities:  Hearing? 0   Vision? 0   Difficulty concentrating or making decisions? 0   Walking or climbing stairs? 0   Dressing or bathing? 0   Doing errands, shopping? 0   Preparing Food and eating ?  N  Using the Toilet?  N  In the past six months, have you accidently leaked urine?  N  Do you have problems with loss of bowel control?  N  Managing your Medications?  N  Managing your Finances?  N  Housekeeping or managing your Housekeeping?  N    Patient Care Team: Hoyt Koch, MD as PCP - General (Internal Medicine)  Indicate any recent Medical Services you may have received from other than [facility] providers in the past year (date may be approximate).     Assessment:   This is a routine wellness examination for Carolyn Fowler.  Hearing/Vision screen Hearing Screening - Comments:: Patient states she hears well Vision Screening - Comments:: Appointment yearly with Dr. Gershon Crane  Dietary issues and exercise activities discussed: Current Exercise Habits: Home exercise routine, Type of exercise: walking, Time (Minutes): 10, Frequency (Times/Week):  3, Weekly Exercise (Minutes/Week): 30, Intensity: Mild, Exercise limited by: None identified   Goals Addressed   None   Depression Screen    02/19/2022   11:39 AM 01/22/2022    8:55 AM 01/21/2022    8:40 AM 01/20/2021    8:08 AM 09/24/2019   10:19 AM 08/24/2018    8:18 AM 08/02/2017    8:29 AM  PHQ 2/9 Scores  PHQ - 2 Score 0 0 0 0 0 0 0  PHQ- 9 Score 0  7        Fall Risk    02/19/2022   11:39 AM 01/22/2022    8:55 AM 01/21/2022    8:27 AM 07/18/2020    8:46 AM 09/24/2019   10:10 AM  Fall Risk   Falls in the past year? 0 0 0 1 0  Number falls in past yr: 0 0 0 0 0  Injury with Fall? 0 0 0 0 0  Risk for fall due to :  No Fall Risks   No Fall Risks  Follow up Falls evaluation completed Falls evaluation completed Falls evaluation completed  Falls evaluation completed    [place]:  Any stairs in or around the home? Yes  If so, are there any without handrails? Yes  Home free of loose throw rugs in walkways, pet beds, electrical cords, etc? Yes  Adequate lighting in your home to reduce risk of falls? Yes   ASSISTIVE DEVICES UTILIZED TO PREVENT FALLS:  Life alert? No  Use of a cane, walker or w/c? Yes  Grab bars in the bathroom? Yes  Shower chair or bench in shower? Yes  Elevated toilet seat or a handicapped toilet? Yes   TIMED UP AND GO:  Was the test performed? No .  Length of time to ambulate 10 feet: n/a sec.     Cognitive Function:    08/02/2017    8:31 AM  MMSE - Mini Mental State  Exam  Orientation to time 5  Orientation to Place 5  Registration 3  Attention/ Calculation 5  Recall 3  Language- name 2 objects 2  Language- repeat 1  Language- follow 3 step command 3  Language- read & follow direction 1  Write a sentence 1  Copy design 1  Total score 30        01/22/2022    8:56 AM 09/24/2019   10:23 AM  6CIT Screen  What Year? 0 points 0 points  What month? 0 points 0 points  What time? 0 points 3 points   Count back from 20 0 points 0 points  Months in reverse 0 points 0 points  Repeat phrase 0 points 0 points  Total Score 0 points 3 points    Immunizations Immunization History  Administered Date(s) Administered   Fluad Quad(high Dose 65+) 12/14/2018, 12/19/2019, 01/20/2021, 01/21/2022   Moderna Sars-Covid-2 Vaccination 04/30/2019, 05/29/2019, 01/06/2020   Pneumococcal Conjugate-13 02/18/2015   Pneumococcal Polysaccharide-23 07/01/2011, 09/15/2017   Td 09/23/2009   Tdap 08/11/2020   Zoster Recombinat (Shingrix) 03/14/2021    TDAP status: Up to date  Flu Vaccine status: Up to date  Pneumococcal vaccine status: Up to date  Covid-19 vaccine status: Information provided on how to obtain vaccines.   Qualifies for Shingles Vaccine? Yes   Zostavax completed No   Shingrix Completed?: No.    Education has been provided regarding the importance of this vaccine. Patient has been advised to call insurance company to determine out of pocket expense if they have not yet received this vaccine. Advised may also receive vaccine at local pharmacy or Health Dept. Verbalized acceptance and understanding.  Screening Tests Health Maintenance  Topic Date Due   Zoster Vaccines- Shingrix (2 of 2) 05/09/2021   COVID-19 Vaccine (4 - 2023-24 season) 11/06/2021   INFLUENZA VACCINE  10/07/2022   Medicare Annual Wellness (AWV)  01/23/2023   COLONOSCOPY (Pts 45-46yrs Insurance coverage will need to be confirmed)  12/08/2023   DTaP/Tdap/Td (3 - Td or Tdap) 08/12/2030   Pneumonia Vaccine 53+ Years old  Completed   DEXA SCAN  Completed   Hepatitis C Screening  Completed   HPV VACCINES  Aged Out    Health Maintenance  Health Maintenance Due  Topic Date Due   Zoster Vaccines- Shingrix (2 of 2) 05/09/2021   COVID-19 Vaccine (4 - 2023-24 season) 11/06/2021    Colorectal cancer screening: Type of screening: Colonoscopy. Completed 12/08/2018. Repeat every 5 years  Mammogram status: Ordered 01/22/2022.  Pt provided with contact info and advised to call to schedule appt.   Bone Density status: Completed 07/29/2014. Results reflect: Bone density results: NORMAL. Repeat every n/a years.  Lung Cancer Screening: (Low Dose CT Chest recommended if Age 61-80 years, 30 pack-year currently smoking OR have quit w/in 15years.) does not qualify.   Lung Cancer Screening Referral: n/a  Additional Screening:  Hepatitis C Screening: does qualify; Completed 01/17/2020  Vision Screening: Recommended annual ophthalmology exams for early detection of glaucoma and other disorders of the eye. Is the patient up to date with their annual eye exam?  Yes  Who is the provider or what is the name of the office in which the patient attends annual eye exams? Dr. Gershon Crane If pt is not established with a provider, would they like to be referred to a provider to establish care? No .   Dental Screening: Recommended annual dental exams for proper oral hygiene  Community Resource Referral / Chronic Care Management: CRR  required this visit?  No   CCM required this visit?  No      Plan:     I have personally reviewed and noted the following in the patient's chart:   Medical and social history Use of alcohol, tobacco or illicit drugs  Current medications and supplements including opioid prescriptions. Patient is not currently taking opioid prescriptions. Functional ability and status Nutritional status Physical activity Advanced directives List of other physicians Hospitalizations, surgeries, and ER visits in previous 12 months Vitals Screenings to include cognitive, depression, and falls Referrals and appointments  In addition, I have reviewed and discussed with patient certain preventive protocols, quality metrics, and best practice recommendations. A written personalized care plan for preventive services as well as general preventive health recommendations were provided to patient.     Carolyn Fowler, CMA   06/08/2022  Non face to face 30 minutes  Nurse Notes:  Carolyn Fowler , Thank you for taking time to come for your Medicare Wellness Visit. I appreciate your ongoing commitment to your health goals. Please review the following plan we discussed and let me know if I can assist you in the future.   These are the goals we discussed:  Goals       Patient Stated      I am going to be more committed to going to [employer] and choosing the right foods to eat. Enjoy life, family, travel and worship God.       Patient Stated (pt-stated)      Would like to lose weight.      weight loss      Lost weight in 5 pound increments, continue to exercise, and eat healthy.         This is a list of the screening recommended for you and due dates:  Health Maintenance  Topic Date Due   Zoster (Shingles) Vaccine (2 of 2) 05/09/2021   COVID-19 Vaccine (4 - 2023-24 season) 11/06/2021   Flu Shot  10/07/2022   Medicare Annual Wellness Visit  01/23/2023   Colon Cancer Screening  12/08/2023   DTaP/Tdap/Td vaccine (3 - Td or Tdap) 08/12/2030   Pneumonia Vaccine  Completed   DEXA scan (bone density measurement)  Completed   Hepatitis C Screening: USPSTF Recommendation to screen - Ages 36-79 yo.  Completed   HPV Vaccine  Aged Out

## 2022-07-20 ENCOUNTER — Ambulatory Visit: Payer: Medicare PPO | Attending: Cardiology | Admitting: Pharmacist

## 2022-07-20 VITALS — BP 160/100 | HR 75

## 2022-07-20 DIAGNOSIS — I10 Essential (primary) hypertension: Secondary | ICD-10-CM

## 2022-07-20 MED ORDER — AMLODIPINE BESYLATE 5 MG PO TABS: 5.0000 mg | ORAL_TABLET | Freq: Every day | ORAL | 3 refills | Status: DC

## 2022-07-20 NOTE — Patient Instructions (Signed)
Summary of today's discussion  START amlodipine 5mg  daily  2. Continue HCTZ 25mg , metoprolol tartrate 50mg  twice a day, valsartan 320mg  daily  3. Check blood pressure daily  4. Please bring your blood pressure cuff and list of readings to your next appointment  5. Start walking or biking. Set small goals and increase. Start with 5-10 min several days per week   Your blood pressure goal is <130/80  To check your pressure at home you will need to:  1. Sit up in a chair, with feet flat on the floor and back supported. Do not cross your ankles or legs. 2. Rest your left arm so that the cuff is about heart level. If the cuff goes on your upper arm,  then just relax the arm on the table, arm of the chair or your lap. If you have a wrist cuff, we  suggest relaxing your wrist against your chest (think of it as Pledging the Flag with the  wrong arm).  3. Place the cuff snugly around your arm, about 1 inch above the crook of your elbow. The  cords should be inside the groove of your elbow.  4. Sit quietly, with the cuff in place, for about 5 minutes. After that 5 minutes press the power  button to start a reading. 5. Do not talk or move while the reading is taking place.  6. Record your readings on a sheet of paper. Although most cuffs have a memory, it is often  easier to see a pattern developing when the numbers are all in front of you.  7. You can repeat the reading after 1-3 minutes if it is recommended  Make sure your bladder is empty and you have not had caffeine or tobacco within the last 30 min  Always bring your blood pressure log with you to your appointments. If you have not brought your monitor in to be double checked for accuracy, please bring it to your next appointment.  You can find a list of validated (accurate) blood pressure cuffs at [email or web address]   Important lifestyle changes to control high blood pressure  Intervention  Effect on the BP  Lose extra pounds and watch  your waistline Weight loss is one of the most effective lifestyle changes for controlling blood pressure. If you're overweight or obese, losing even a small amount of weight can help reduce blood pressure. Blood pressure might go down by about 1 millimeter of mercury (mm Hg) with each kilogram (about 2.2 pounds) of weight lost.  Exercise regularly As a general goal, aim for at least 30 minutes of moderate physical activity every day. Regular physical activity can lower high blood pressure by about 5 to 8 mm Hg.  Eat a healthy diet Eating a diet rich in whole grains, fruits, vegetables, and low-fat dairy products and low in saturated fat and cholesterol. A healthy diet can lower high blood pressure by up to 11 mm Hg.  Reduce salt (sodium) in your diet Even a small reduction of sodium in the diet can improve heart health and reduce high blood pressure by about 5 to 6 mm Hg.  Limit alcohol One drink equals 12 ounces of beer, 5 ounces of wine, or 1.5 ounces of 80-proof liquor.  Limiting alcohol to less than one drink a day for women or two drinks a day for men can help lower blood pressure by about 4 mm Hg.   Please call me at [phone] with any questions.

## 2022-07-20 NOTE — Assessment & Plan Note (Addendum)
Assessment: Blood pressure remains significantly elevated, although improved from last visit Reviewed foods high in sodium and goal of 2000mg / day- recommended avoiding the beef and broccoli frozen meals Discussed exercise and setting small goals. Start with 5-10  min and increase by 5 min  We talked about the importance of controlling blood pressure and how often there are no signs the BP is high  Plan: Start amlodipine 5mg  daily Check BMP today since adding valsartan Follow up in 3 weeks. Pt aware she will see one of my colleagues.  Bring home BP machine and readings to next visit.

## 2022-07-20 NOTE — Progress Notes (Signed)
Patient ID: Carolyn Fowler                 DOB: 1945/12/01                      MRN: [id number]      HPI: Carolyn Fowler is a 77 y.o. female referred by Dr. Eden Emms to HTN clinic. PMH is significant for HTN, ? Pulmonary HTN, LVH. Patient seen by Dr. Eden Emms 05/31/22. BP was 199/99. Valsartan 320mg  was added. Patient referred to PharmD clinic. Also recommended a sleep study and cardiac MRI.   Patient presents today, accompanied by her granddaughter. Patient reports BP of around 150/95 mostly with some around 138/87. She has both a wrist and upper arm cuff. She denies any significant dizziness, lightheadedness, headache or swelling. Is complaint with her medications. She use to sometimes forget to take her AM medications (or they would be delayed) but her granddaughters have sent alarms on her phone which have helped. She takes metoprolol at 8 AM and 8PM, HCTZ at 12:00 PM and valsartan at 2 PM. No very active. Afraid to walk outside.  Previously on up to 5 BP medications after her back surgery. Doesn't know why they were stopped. No adverse effects.   Current HTN meds: HCTZ 25mg  daily, metoprolol tartrate 50mg  twice a day, valsartan 320mg  daily Previously tried:  BP goal: <130/80  Family History:  Family History  Adopted: Yes  Problem Relation Age of Onset   Arthritis Mother    Heart disease Father    Lupus Daughter    Lupus Granddaughter    Lupus Niece     Social History: no tobacco, no alcohol  Diet: cooks with salt, but doesn't add it after, more pepper  Exercise: has a bike, but doesn't ride it. Some walking in the house  Home BP readings: 150/95, 138/87  Wt Readings from Last 3 Encounters:  05/31/22 240 lb (108.9 kg)  02/19/22 243 lb (110.2 kg)  02/02/22 244 lb (110.7 kg)   BP Readings from Last 3 Encounters:  07/20/22 (!) 160/100  05/31/22 (!) 199/99  03/08/22 (!) 158/97   Pulse Readings from Last 3 Encounters:  07/20/22 75  05/31/22 73  03/08/22 65     Renal function: CrCl cannot be calculated (Patient's most recent lab result is older than the maximum 21 days allowed.).  Past Medical History:  Diagnosis Date   Allergy    Anemia    Arthritis    Cataract    forming    Dysrhythmia    occ palpitations- no cardiologist   Exposure to TB    1990's- was treated 2 x then, no s/s    GERD (gastroesophageal reflux disease)    History of keloid of skin    Hypertension    Neuromuscular disorder (HCC)    right leg tumbness and tingling from herniated disc    Current Outpatient Medications on File Prior to Visit  Medication Sig Dispense Refill   acetaminophen (TYLENOL) 500 MG tablet Take by mouth.     aspirin 81 MG tablet Take 81 mg by mouth daily.     famotidine (PEPCID) 20 MG tablet Take 1 tablet by mouth twice daily 180 tablet 0   fluticasone (FLONASE) 50 MCG/ACT nasal spray Place 2 sprays into both nostrils daily. (Patient taking differently: Place 2 sprays into both nostrils as needed.) 16 g 6   hydrochlorothiazide (HYDRODIURIL) 25 MG tablet Take 1 tablet (25 mg total) by mouth daily.  90 tablet 3   metoprolol tartrate (LOPRESSOR) 50 MG tablet Take 1 tablet (50 mg total) by mouth 2 (two) times daily. 180 tablet 3   Multiple Vitamin (MULTIVITAMIN WITH MINERALS) TABS Take 1 tablet by mouth daily.     nystatin-triamcinolone ointment (MYCOLOG) Apply 1 application topically 2 (two) times daily. 100 g 3   Propylene Glycol (SYSTANE COMPLETE OP) Apply to eye.     Sennosides (SENOKOT PO) Take by mouth as needed.      valsartan (DIOVAN) 320 MG tablet Take 1 tablet (320 mg total) by mouth daily. 90 tablet 3   No current facility-administered medications on file prior to visit.    Allergies  Allergen Reactions   Penicillins Itching, Swelling and Rash    Itched inside and out   Egg-Derived Products     vomiting   Tetracyclines & Related Nausea And Vomiting    Blood pressure (!) 160/100, pulse 75.   Assessment/Plan: HYPERTENSION  CONTROL Vitals:   07/20/22 1355 07/20/22 1429  BP: (!) 170/94 (!) 160/100    The patient's blood pressure is elevated above target today.  In order to address the patient's elevated BP: A new medication was prescribed today.      1. Hypertension -  Essential hypertension Assessment: Blood pressure remains significantly elevated, although improved from last visit Reviewed foods high in sodium and goal of 2000mg / day- recommended avoiding the beef and broccoli frozen meals Discussed exercise and setting small goals. Start with 5-10  min and increase by 5 min  We talked about the importance of controlling blood pressure and how often there are no signs the BP is high  Plan: Start amlodipine 5mg  daily Check BMP today since adding valsartan Follow up in 3 weeks. Pt aware she will see one of my colleagues.  Bring home BP machine and readings to next visit.   Thank you  Olene Floss, Pharm.D, BCPS, CPP [facility] [address], [place], [place] [address]  Phone: [phone]; Fax: [phone]

## 2022-07-21 LAB — BASIC METABOLIC PANEL
BUN/Creatinine Ratio: 29 — ABNORMAL HIGH (ref 12–28)
BUN: 20 mg/dL (ref 8–27)
CO2: 26 mmol/L (ref 20–29)
Calcium: 9.6 mg/dL (ref 8.7–10.3)
Chloride: 100 mmol/L (ref 96–106)
Creatinine, Ser: 0.7 mg/dL (ref 0.57–1.00)
Glucose: 83 mg/dL (ref 70–99)
Potassium: 3.7 mmol/L (ref 3.5–5.2)
Sodium: 142 mmol/L (ref 134–144)
eGFR: 90 mL/min/{1.73_m2} (ref >59)

## 2022-08-10 ENCOUNTER — Encounter: Payer: Self-pay | Admitting: Student

## 2022-08-10 ENCOUNTER — Ambulatory Visit: Payer: Medicare PPO | Attending: Interventional Cardiology | Admitting: Student

## 2022-08-10 VITALS — BP 140/90 | HR 75

## 2022-08-10 DIAGNOSIS — I10 Essential (primary) hypertension: Secondary | ICD-10-CM | POA: Diagnosis not present

## 2022-08-10 MED ORDER — CARVEDILOL 25 MG PO TABS: 25.0000 mg | ORAL_TABLET | Freq: Two times a day (BID) | ORAL | 3 refills | Status: AC

## 2022-08-10 NOTE — Progress Notes (Signed)
Patient ID: Carolyn Fowler                 DOB: 1945/10/27                      MRN: [id number]      HPI: Carolyn Fowler is a 77 y.o. female referred by Dr. Eden Emms to HTN clinic. PMH is significant for HTN, ? Pulmonary HTN, LVH. Patient seen by Dr. Eden Emms 05/31/22. BP was 199/99. Valsartan 320mg  was added. Patient referred to PharmD clinic. Also recommended a sleep study and cardiac MRI. At last visit with PharmD amlodipine 5 mg was added to other BP medications.  Patient presented today for BP follow up. Her home BP ~150-130/90-75 heart rate ~75. She feels great. Amlodipine is helping to control BP well. Denies SOB,dizziness, palpitation or swelling. Brought in home cuffs for validation- arm cuff was accurate. Suggest to keep using arm cuff instead of wrist cuff. Still does not use the stationary bike at home but will start using it from tomorrow for 15 min twice daily. Eats very little salt and eats healthy food   1st on the home cuff 140/95 heart rate 68  1st on office cuff 142/96 heart rate 70  2nd on home cuff 141/95 heart rate 73 2nd on office cuff 140/90 heart rate 75   Current HTN meds: HCTZ 25mg  daily, metoprolol tartrate 50mg  twice a day, valsartan 320mg  daily, amlodipine 5 mg daily   BP goal: <130/80    Family History:  Family History  Adopted: Yes  Problem Relation Age of Onset   Arthritis Mother    Heart disease Father    Lupus Daughter    Lupus Granddaughter    Lupus Niece     Social History: no tobacco, no alcohol  Diet: cooks with very little salt, but doesn't add it after, more pepper  Exercise: will start using stationary bike at home   Home BP readings: 150-130/94-75 HR 75   Wt Readings from Last 3 Encounters:  05/31/22 240 lb (108.9 kg)  02/19/22 243 lb (110.2 kg)  02/02/22 244 lb (110.7 kg)   BP Readings from Last 3 Encounters:  08/10/22 (!) 140/90  07/20/22 (!) 160/100  05/31/22 (!) 199/99   Pulse Readings from Last 3 Encounters:   08/10/22 75  07/20/22 75  05/31/22 73    Renal function: CrCl cannot be calculated (Unknown ideal weight.).  Past Medical History:  Diagnosis Date   Allergy    Anemia    Arthritis    Cataract    forming    Dysrhythmia    occ palpitations- no cardiologist   Exposure to TB    1990's- was treated 2 x then, no s/s    GERD (gastroesophageal reflux disease)    History of keloid of skin    Hypertension    Neuromuscular disorder (HCC)    right leg tumbness and tingling from herniated disc    Current Outpatient Medications on File Prior to Visit  Medication Sig Dispense Refill   acetaminophen (TYLENOL) 500 MG tablet Take by mouth.     amLODipine (NORVASC) 5 MG tablet Take 1 tablet (5 mg total) by mouth daily. 90 tablet 3   aspirin 81 MG tablet Take 81 mg by mouth daily.     famotidine (PEPCID) 20 MG tablet Take 1 tablet by mouth twice daily 180 tablet 0   fluticasone (FLONASE) 50 MCG/ACT nasal spray Place 2 sprays into both nostrils daily. (Patient  taking differently: Place 2 sprays into both nostrils as needed.) 16 g 6   hydrochlorothiazide (HYDRODIURIL) 25 MG tablet Take 1 tablet (25 mg total) by mouth daily. 90 tablet 3   Multiple Vitamin (MULTIVITAMIN WITH MINERALS) TABS Take 1 tablet by mouth daily.     nystatin-triamcinolone ointment (MYCOLOG) Apply 1 application topically 2 (two) times daily. 100 g 3   Propylene Glycol (SYSTANE COMPLETE OP) Apply to eye.     Sennosides (SENOKOT PO) Take by mouth as needed.      valsartan (DIOVAN) 320 MG tablet Take 1 tablet (320 mg total) by mouth daily. 90 tablet 3   No current facility-administered medications on file prior to visit.    Allergies  Allergen Reactions   Penicillins Itching, Swelling and Rash    Itched inside and out   Egg-Derived Products     vomiting   Tetracyclines & Related Nausea And Vomiting    Blood pressure (!) 140/90, pulse 75, SpO2 99 %.   HYPERTENSION CONTROL Vitals:   08/10/22 1026 08/10/22 1027   BP: (!) 142/96 (!) 140/90    The patient's blood pressure is elevated above target today.  In order to address the patient's elevated BP: A referral to the PharmD Hypertension Clinic will be placed.; A current anti-hypertensive medication was adjusted today.      Essential hypertension Assessment: BP is uncontrolled in office BP 142/96 and 140/90 mmHg  (goal <130/80)  Home BP ~150-130/97-75 heart rate ~75 Takes  BP medications regularly (phone reminder on granddaughter's phone) and tolerates them well without any side effects  Denies SOB, palpitation, chest pain, headaches,or swelling Does not do regular exercise  Eats low salt diet  Given uncontrolled BP at home and in office we will switch metoprolol to carvedilol   Plan:  Start taking carvedilol 25 mg twice daily and stop taking metoprolol  Continue taking HCTZ 25mg  daily, valsartan 320mg  daily, amlodipine 5mg  daily  Reiterated importance of regular exercise and low salt diet  Patient to keep record of BP readings with heart rate and report to [place] at the next visit Patient to see PharmD in 8 weeks for follow up  Follow up lab(s) : none    Thank you  Carmela Hurt, Pharm.D [facility] Division of [facility] [address], [place], [place] [address]  Phone: [phone]; Fax: [phone]

## 2022-08-10 NOTE — Patient Instructions (Addendum)
Changes made by your pharmacist Carmela Hurt, PharmD at today's visit:    Instructions/Changes  (what do you need to do) Your Notes  (what you did and when you did it)  Continue taking amlodipine 5 mg daily HCTZ 25 mg daily, valsartan 320 mg daily    2. Stop taking metoprolol 50 mg twice daily and start taking carvedilol 25 mg twice daily    3. Start using bike at home for 30 min every other day     Bring all of your meds, your BP cuff and your record of home blood pressures to your next appointment.    HOW TO TAKE YOUR BLOOD PRESSURE AT HOME  Rest 5 minutes before taking your blood pressure.  Don't smoke or drink caffeinated beverages for at least 30 minutes before. Take your blood pressure before (not after) you eat. Sit comfortably with your back supported and both feet on the floor (don't cross your legs). Elevate your arm to heart level on a table or a desk. Use the proper sized cuff. It should fit smoothly and snugly around your bare upper arm. There should be enough room to slip a fingertip under the cuff. The bottom edge of the cuff should be 1 inch above the crease of the elbow. Ideally, take 3 measurements at one sitting and record the average.  Important lifestyle changes to control high blood pressure  Intervention  Effect on the BP  Lose extra pounds and watch your waistline Weight loss is one of the most effective lifestyle changes for controlling blood pressure. If you're overweight or obese, losing even a small amount of weight can help reduce blood pressure. Blood pressure might go down by about 1 millimeter of mercury (mm Hg) with each kilogram (about 2.2 pounds) of weight lost.  Exercise regularly As a general goal, aim for at least 30 minutes of moderate physical activity every day. Regular physical activity can lower high blood pressure by about 5 to 8 mm Hg.  Eat a healthy diet Eating a diet rich in whole grains, fruits, vegetables, and low-fat dairy products  and low in saturated fat and cholesterol. A healthy diet can lower high blood pressure by up to 11 mm Hg.  Reduce salt (sodium) in your diet Even a small reduction of sodium in the diet can improve heart health and reduce high blood pressure by about 5 to 6 mm Hg.  Limit alcohol One drink equals 12 ounces of beer, 5 ounces of wine, or 1.5 ounces of 80-proof liquor.  Limiting alcohol to less than one drink a day for women or two drinks a day for men can help lower blood pressure by about 4 mm Hg.   If you have any questions or concerns please use My Chart to send questions or call the office at [phone]

## 2022-08-10 NOTE — Assessment & Plan Note (Addendum)
Assessment: BP is uncontrolled in office BP 142/96 and 140/90 mmHg  (goal <130/80)  Home BP ~150-130/97-75 heart rate ~75 Takes  BP medications regularly (phone reminder on granddaughter's phone) and tolerates them well without any side effects  Denies SOB, palpitation, chest pain, headaches,or swelling Does not do regular exercise  Eats low salt diet  Given uncontrolled BP at home and in office we will switch metoprolol to carvedilol   Plan:  Start taking carvedilol 25 mg twice daily and stop taking metoprolol  Continue taking HCTZ 25mg  daily, valsartan 320mg  daily, amlodipine 5mg  daily  Reiterated importance of regular exercise and low salt diet  Patient to keep record of BP readings with heart rate and report to [place] at the next visit Patient to see PharmD in 8 weeks for follow up  Follow up lab(s) : none

## 2022-08-12 ENCOUNTER — Telehealth (HOSPITAL_COMMUNITY): Payer: Self-pay | Admitting: *Deleted

## 2022-08-12 NOTE — Telephone Encounter (Signed)
Reaching out to patient to offer assistance regarding upcoming cardiac imaging study; pt verbalizes understanding of appt date/time, parking situation and where to check in, and verified current allergies; name and call back number provided for further questions should they arise  Larey Brick RN Navigator Cardiac Imaging [facility] [phone] office [phone] cell  Patient denies claustrophobia but does report rods in her back.

## 2022-08-13 ENCOUNTER — Other Ambulatory Visit: Payer: Self-pay | Admitting: Cardiovascular Disease

## 2022-08-13 ENCOUNTER — Ambulatory Visit (HOSPITAL_COMMUNITY)
Admission: RE | Admit: 2022-08-13 | Discharge: 2022-08-13 | Disposition: A | Discharge status: Home / Self Care | Payer: Medicare PPO | Source: Ambulatory Visit | Attending: Cardiovascular Disease | Admitting: Cardiovascular Disease

## 2022-08-13 DIAGNOSIS — R0600 Dyspnea, unspecified: Secondary | ICD-10-CM | POA: Insufficient documentation

## 2022-08-13 DIAGNOSIS — I10 Essential (primary) hypertension: Secondary | ICD-10-CM | POA: Diagnosis not present

## 2022-08-13 DIAGNOSIS — I517 Cardiomegaly: Secondary | ICD-10-CM | POA: Diagnosis not present

## 2022-08-13 MED ORDER — GADOBUTROL 1 MMOL/ML IV SOLN
10.0000 mL | Freq: Once | INTRAVENOUS | Status: AC | PRN
  Administered 2022-08-13: 10 mL via INTRAVENOUS

## 2022-09-03 NOTE — Progress Notes (Signed)
CARDIOLOGY CONSULT NOTE       Patient ID: Carolyn Fowler MRN: [id number] DOB/AGE: 1946/02/13 77 y.o.  Primary Physician: Myrlene Broker, MD Primary Cardiologist: Eden Emms    HPI:  77 y.o. referred by Dr Okey Dupre for ? Pulmonary HTN. First seen by me 05/31/22 History of dyspnea with exertion HTN seems to be poorly controlled looking at prior Office records Reviewed TTE from 04/30/22. Notable for LVH EF 65-70% no signs of pulmonary HTN and trivial TR unable to estimate PA pressures and only grade one diastolic dysfunction CT scan with aortic root 4.2 cm suggestion of dilated PA trunk no ILD She had congestion and was sick for a week after getting her flu shot end of 2023   The issue is not elevated PA pressures It is very poorly controlled BP. After back surgery in 2016 she was on 4 BP pills The LVH on echo likely from this She should have a sleep study to r/o OSA this is the only thing that could cause elevated PA pressures The fact that she did not have enough TR on echo to measure PA pressure means it isn't elevated and there were no morphologic signs of it in regard to RVH or D shaped septum  Cardiac MRI done 08/13/22 EF 55% severe LVH normal CO 6.1 L/min no amyloid aortal 4.2 cm dilated PA 4.3 cm   ARB and Norvasc added for BP control Home records show improvement    ROS All other systems reviewed and negative except as noted above  Past Medical History:  Diagnosis Date   Allergy    Anemia    Arthritis    Cataract    forming    Dysrhythmia    occ palpitations- no cardiologist   Exposure to TB    1990's- was treated 2 x then, no s/s    GERD (gastroesophageal reflux disease)    History of keloid of skin    Hypertension    Neuromuscular disorder (HCC)    right leg tumbness and tingling from herniated disc    Family History  Adopted: Yes  Problem Relation Age of Onset   Arthritis Mother    Heart disease Father    Lupus Daughter    Lupus Granddaughter    Lupus  Niece     Social History   Socioeconomic History   Marital status: Widowed    Spouse name: Not on file   Number of children: 5   Years of education: 53   Highest education level: Not on file  Occupational History   Occupation: Geologist, engineering    Comment: retired  Tobacco Use   Smoking status: Never   Smokeless tobacco: Never  Vaping Use   Vaping status: Never Used  Substance and Sexual Activity   Alcohol use: No   Drug use: No   Sexual activity: Never  Other Topics Concern   Not on file  Social History Narrative   [employer], [employer] BA, Master's degree vocational ed. Married - '66- '79/divorced; married '04   2 sons - '67, '74; 3 daughters - '71, '74, '74; 15 grandchilderns; 3 great-grandchildren. Work - retired from Radiation protection practitioner. Marriage in good health.    Social Determinants of Health   Financial Resource Strain: Low Risk  (01/22/2022)   Overall Financial Resource Strain (CARDIA)    Difficulty of Paying Living Expenses: Not hard at all  Food Insecurity: No Food Insecurity (01/22/2022)   Hunger Vital Sign    Worried About Running Out of Food  in the Last Year: Never true    Ran Out of Food in the Last Year: Never true  Transportation Needs: No Transportation Needs (01/22/2022)   PRAPARE - Administrator, Civil Service (Medical): No    Lack of Transportation (Non-Medical): No  Physical Activity: Inactive (01/22/2022)   Exercise Vital Sign    Days of Exercise per Week: 0 days    Minutes of Exercise per Session: 0 min  Stress: No Stress Concern Present (01/22/2022)   [employer] of Occupational Health - Occupational Stress Questionnaire    Feeling of Stress : Not at all  Social Connections: Moderately Isolated (01/22/2022)   Social Connection and Isolation Panel [NHANES]    Frequency of Communication with Friends and Family: More than three times a week    Frequency of Social Gatherings with Friends and Family: Three times a week    Attends  Religious Services: More than 4 times per year    Active Member of Clubs or Organizations: No    Attends Banker Meetings: Never    Marital Status: Widowed  Intimate Partner Violence: Not At Risk (01/22/2022)   Humiliation, Afraid, Rape, and Kick questionnaire    Fear of Current or Ex-Partner: No    Emotionally Abused: No    Physically Abused: No    Sexually Abused: No    Past Surgical History:  Procedure Laterality Date   ABDOMINAL HYSTERECTOMY     APPENDECTOMY     BACK SURGERY  Aug 2013   BACK SURGERY  Aug 2016   L3 4 5 S1   COLONOSCOPY     COLONOSCOPY W/ POLYPECTOMY     KELOID EXCISION     on ear   KNEE ARTHROSCOPY     left   KNEE SURGERY     POLYPECTOMY     SHOULDER OPEN ROTATOR CUFF REPAIR     left   TUBAL LIGATION        Current Outpatient Medications:    acetaminophen (TYLENOL) 500 MG tablet, Take by mouth., Disp: , Rfl:    amLODipine (NORVASC) 5 MG tablet, Take 1 tablet (5 mg total) by mouth daily., Disp: 90 tablet, Rfl: 3   aspirin 81 MG tablet, Take 81 mg by mouth daily., Disp: , Rfl:    carvedilol (COREG) 25 MG tablet, Take 1 tablet (25 mg total) by mouth 2 (two) times daily., Disp: 180 tablet, Rfl: 3   famotidine (PEPCID) 20 MG tablet, Take 1 tablet by mouth twice daily, Disp: 180 tablet, Rfl: 0   fluticasone (FLONASE) 50 MCG/ACT nasal spray, Place 2 sprays into both nostrils daily. (Patient taking differently: Place 2 sprays into both nostrils as needed.), Disp: 16 g, Rfl: 6   hydrochlorothiazide (HYDRODIURIL) 25 MG tablet, Take 1 tablet (25 mg total) by mouth daily., Disp: 90 tablet, Rfl: 3   Multiple Vitamin (MULTIVITAMIN WITH MINERALS) TABS, Take 1 tablet by mouth daily., Disp: , Rfl:    nystatin-triamcinolone ointment (MYCOLOG), Apply 1 application topically 2 (two) times daily., Disp: 100 g, Rfl: 3   Propylene Glycol (SYSTANE COMPLETE OP), Apply to eye., Disp: , Rfl:    Sennosides (SENOKOT PO), Take by mouth as needed. , Disp: , Rfl:     valsartan (DIOVAN) 320 MG tablet, Take 1 tablet (320 mg total) by mouth daily., Disp: 90 tablet, Rfl: 3    Physical Exam: Blood pressure 134/80, pulse 72, height 5\' 2"  (1.575 m), weight 240 lb (108.9 kg), SpO2 96%.    Affect  appropriate Healthy:  appears stated age HEENT: normal Neck supple with no adenopathy JVP normal no bruits no thyromegaly Lungs clear with no wheezing and good diaphragmatic motion Heart:  S1/S2 no murmur, no rub, gallop or click PMI normal Abdomen: benighn, BS positve, no tenderness, no AAA no bruit.  No HSM or HJR Distal pulses intact with no bruits No edema Neuro non-focal Skin warm and dry No muscular weakness    Labs:   Lab Results  Component Value Date   WBC 5.9 01/22/2022   HGB 13.9 01/22/2022   HCT 43.5 01/22/2022   MCV 74.0 (L) 01/22/2022   PLT 201.0 01/22/2022   No results for input(s): "NA", "K", "CL", "CO2", "BUN", "CREATININE", "CALCIUM", "PROT", "BILITOT", "ALKPHOS", "ALT", "AST", "GLUCOSE" in the last 168 hours.  Invalid input(s): "LABALBU" Lab Results  Component Value Date   CKTOTAL 75 07/09/2020    Lab Results  Component Value Date   CHOL 159 01/22/2022   CHOL 186 01/20/2021   CHOL 191 01/17/2020   Lab Results  Component Value Date   HDL 49.20 01/22/2022   HDL 53.30 01/20/2021   HDL 57.10 01/17/2020   Lab Results  Component Value Date   LDLCALC 93 01/22/2022   LDLCALC 119 (H) 01/20/2021   LDLCALC 117 (H) 01/17/2020   Lab Results  Component Value Date   TRIG 84.0 01/22/2022   TRIG 70.0 01/20/2021   TRIG 82.0 01/17/2020   Lab Results  Component Value Date   CHOLHDL 3 01/22/2022   CHOLHDL 3 01/20/2021   CHOLHDL 3 01/17/2020   No results found for: "LDLDIRECT"    Radiology: No results found.  EKG: SR rate 61 PR 212 msecc LAD 09/16/2022    ASSESSMENT AND PLAN:   Dyspnea: ? Pulmonary hypertension with dilated PA trunk on CT EF hypderdynamic with LVH and only grade one diastolic dysfunction She is  overweight and large breasted with likely some component of restrictive lung dx as evidence of dyspnea bending over. She needs sleep study should be ordered by primary BNP is normal and MRI with no infiltrative dx or amyloid suggesting LVH from poorly controlled BP  HTN:  seems poorly controlled with LVH up to 2 cm in spetum Continue diuretic and beta blocker ARB added MRI no infiltrative dx. Norvasc added 07/20/22   Discussed issues with multiple family members today    F/U 1 year     Signed: Charlton Haws 09/16/2022, 9:05 AM

## 2022-09-16 ENCOUNTER — Encounter: Payer: Self-pay | Admitting: Cardiovascular Disease

## 2022-09-16 ENCOUNTER — Telehealth: Payer: Self-pay | Admitting: Internal Medicine

## 2022-09-16 ENCOUNTER — Ambulatory Visit: Payer: Medicare PPO | Attending: Cardiovascular Disease | Admitting: Cardiovascular Disease

## 2022-09-16 VITALS — BP 134/80 | HR 72 | Ht 62.0 in | Wt 240.0 lb

## 2022-09-16 DIAGNOSIS — I10 Essential (primary) hypertension: Secondary | ICD-10-CM

## 2022-09-16 DIAGNOSIS — I517 Cardiomegaly: Secondary | ICD-10-CM

## 2022-09-16 DIAGNOSIS — R0600 Dyspnea, unspecified: Secondary | ICD-10-CM | POA: Diagnosis not present

## 2022-09-16 NOTE — Telephone Encounter (Signed)
Patient called and said that her heart doctor - Dr. Eden Emms said that she needs to be scheduled for a sleep apnea test.

## 2022-09-16 NOTE — Telephone Encounter (Signed)
Pt has to make appt w/ Dr. Okey Dupre first then she can refer her out. Pls schedule appt...Carolyn Fowler

## 2022-09-16 NOTE — Patient Instructions (Addendum)
Medication Instructions:  Your physician recommends that you continue on your current medications as directed. Please refer to the Current Medication list given to you today.  *If you need a refill on your cardiac medications before your next appointment, please call your pharmacy*  Lab Work: If you have labs (blood work) drawn today and your tests are completely normal, you will receive your results only by: MyChart Message (if you have MyChart) OR A paper copy in the mail If you have any lab test that is abnormal or we need to change your treatment, we will call you to review the results.  Testing/Procedures: None ordered today.  Follow-Up: At [facility], you and your health needs are our priority.  As part of our continuing mission to provide you with exceptional heart care, we have created designated Provider Care Teams.  These Care Teams include your primary Cardiologist (physician) and Advanced Practice Providers (APPs -  Physician Assistants and Nurse Practitioners) who all work together to provide you with the care you need, when you need it.  We recommend signing up for the patient portal called "MyChart".  Sign up information is provided on this After Visit Summary.  MyChart is used to connect with patients for Virtual Visits (Telemedicine).  Patients are able to view lab/test results, encounter notes, upcoming appointments, etc.  Non-urgent messages can be sent to your provider as well.   To learn more about what you can do with MyChart, go to [email or web address].    Your next appointment:   6 month(s)  Provider:   Peter Nishan, MD     

## 2022-10-11 ENCOUNTER — Telehealth: Payer: Self-pay | Admitting: Pharmacist

## 2022-10-11 ENCOUNTER — Ambulatory Visit: Payer: Medicare PPO

## 2022-10-11 NOTE — Progress Notes (Deleted)
Patient ID: DENEEN NOTH                 DOB: Oct 14, 1945                      MRN: [id number]     HPI: Carolyn Fowler is a 77 y.o. female referred by Dr. Marland Kitchen to HTN clinic. PMH is significant for  Current HTN meds:  Previously tried:  BP goal:   Family History:   Social History:   Diet:   Exercise:   Home BP readings:   Wt Readings from Last 3 Encounters:  09/16/22 240 lb (108.9 kg)  05/31/22 240 lb (108.9 kg)  02/19/22 243 lb (110.2 kg)   BP Readings from Last 3 Encounters:  09/16/22 134/80  08/10/22 (!) 140/90  07/20/22 (!) 160/100   Pulse Readings from Last 3 Encounters:  09/16/22 72  08/10/22 75  07/20/22 75    Renal function: CrCl cannot be calculated (Patient's most recent lab result is older than the maximum 21 days allowed.).  Past Medical History:  Diagnosis Date   Allergy    Anemia    Arthritis    Cataract    forming    Dysrhythmia    occ palpitations- no cardiologist   Exposure to TB    1990's- was treated 2 x then, no s/s    GERD (gastroesophageal reflux disease)    History of keloid of skin    Hypertension    Neuromuscular disorder (HCC)    right leg tumbness and tingling from herniated disc    Current Outpatient Medications on File Prior to Visit  Medication Sig Dispense Refill   acetaminophen (TYLENOL) 500 MG tablet Take by mouth.     amLODipine (NORVASC) 5 MG tablet Take 1 tablet (5 mg total) by mouth daily. 90 tablet 3   aspirin 81 MG tablet Take 81 mg by mouth daily.     carvedilol (COREG) 25 MG tablet Take 1 tablet (25 mg total) by mouth 2 (two) times daily. 180 tablet 3   famotidine (PEPCID) 20 MG tablet Take 1 tablet by mouth twice daily 180 tablet 0   fluticasone (FLONASE) 50 MCG/ACT nasal spray Place 2 sprays into both nostrils daily. (Patient taking differently: Place 2 sprays into both nostrils as needed.) 16 g 6   hydrochlorothiazide (HYDRODIURIL) 25 MG tablet Take 1 tablet (25 mg total) by mouth daily. 90  tablet 3   Multiple Vitamin (MULTIVITAMIN WITH MINERALS) TABS Take 1 tablet by mouth daily.     nystatin-triamcinolone ointment (MYCOLOG) Apply 1 application topically 2 (two) times daily. 100 g 3   Propylene Glycol (SYSTANE COMPLETE OP) Apply to eye.     Sennosides (SENOKOT PO) Take by mouth as needed.      valsartan (DIOVAN) 320 MG tablet Take 1 tablet (320 mg total) by mouth daily. 90 tablet 3   No current facility-administered medications on file prior to visit.    Allergies  Allergen Reactions   Penicillins Itching, Swelling and Rash    Itched inside and out   Egg-Derived Products     vomiting   Tetracyclines & Related Nausea And Vomiting     Assessment/Plan:  1. Hypertension -

## 2022-10-11 NOTE — Telephone Encounter (Signed)
Pt scheduled for HTN appt today, was scheduled 2 months ago at last PharmD appt. Since then, she saw Dr Eden Emms for follow up and BP was well controlled.  Called pt to discuss, she reports tolerating her meds well and home BP readings are well controlled: 129/80, HR 67 111/67, HR 66 121/69, HR 62 137/78, HR 78 129/71, HR 51 1 reading a little high with systolic of 146 but hadn't taken her BP pills yet that day.  Mostly ranging in the 120s-130. Advised pt she does not need to come in today for appt, but to continue on her current meds, continue monitoring her BP at home, and call with any concerns or if BP trends > 130/80 on a regular basis.

## 2022-10-18 ENCOUNTER — Telehealth: Payer: Self-pay | Admitting: Cardiovascular Disease

## 2022-10-18 NOTE — Telephone Encounter (Signed)
Spoke with patient. She isnt sure exactly when it started. Thinks it has been worse in the last month and a half to two months. She has had several medication changes since March.. Last change was in June. Metoprolol was changed to carvedilol. Patient will stop carvedilol and resume metoprolol 75mg  BID for the next few weeks to see if itching improves. Advised she take zyrtec instead of benadryl and see PCP. She will call me in 1-2 weeks to follow up.

## 2022-10-18 NOTE — Telephone Encounter (Signed)
Pt c/o medication issue:  1. Name of Medication:    valsartan (DIOVAN) 320 MG tablet    amLODipine (NORVASC) 5 MG tablet  carvedilol (COREG) 25 MG tablet  hydrochlorothiazide (HYDRODIURIL) 25 MG tablet   2. How are you currently taking this medication (dosage and times per day)? As written   3. Are you having a reaction (difficulty breathing--STAT)? no  4. What is your medication issue? Pt states since starting her bp medications she has been itching badly. She states she has been taking benadryl but it has not been working. She states it "itches on the inside of my skin" and it has been going on for about a month now but hasn't said anything because she thought it would go away. She asked if any of these medications could be causing the itch. Please advise.

## 2022-10-25 ENCOUNTER — Ambulatory Visit (INDEPENDENT_AMBULATORY_CARE_PROVIDER_SITE_OTHER): Payer: Medicare PPO

## 2022-10-25 ENCOUNTER — Ambulatory Visit: Payer: Medicare PPO | Admitting: Internal Medicine

## 2022-10-25 VITALS — BP 124/80 | HR 80 | Temp 98.5°F | Ht 62.0 in | Wt 231.0 lb

## 2022-10-25 DIAGNOSIS — R17 Unspecified jaundice: Secondary | ICD-10-CM

## 2022-10-25 DIAGNOSIS — I10 Essential (primary) hypertension: Secondary | ICD-10-CM | POA: Diagnosis not present

## 2022-10-25 DIAGNOSIS — K219 Gastro-esophageal reflux disease without esophagitis: Secondary | ICD-10-CM | POA: Diagnosis not present

## 2022-10-25 DIAGNOSIS — R7303 Prediabetes: Secondary | ICD-10-CM | POA: Diagnosis not present

## 2022-10-25 DIAGNOSIS — I77819 Aortic ectasia, unspecified site: Secondary | ICD-10-CM | POA: Diagnosis not present

## 2022-10-25 DIAGNOSIS — R1011 Right upper quadrant pain: Secondary | ICD-10-CM

## 2022-10-25 DIAGNOSIS — L299 Pruritus, unspecified: Secondary | ICD-10-CM | POA: Diagnosis not present

## 2022-10-25 LAB — CBC WITH DIFFERENTIAL/PLATELET
Basophils Absolute: 0.1 10*3/uL (ref 0.0–0.1)
Basophils Relative: 1.8 % (ref 0.0–3.0)
Eosinophils Absolute: 0.1 10*3/uL (ref 0.0–0.7)
Eosinophils Relative: 1.4 % (ref 0.0–5.0)
HCT: 43.2 % (ref 36.0–46.0)
Hemoglobin: 13.7 g/dL (ref 12.0–15.0)
Lymphocytes Relative: 39.5 % (ref 12.0–46.0)
Lymphs Abs: 2.9 10*3/uL (ref 0.7–4.0)
MCHC: 31.8 g/dL (ref 30.0–36.0)
MCV: 73.5 fl — ABNORMAL LOW (ref 78.0–100.0)
Monocytes Absolute: 0.6 10*3/uL (ref 0.1–1.0)
Monocytes Relative: 8.4 % (ref 3.0–12.0)
Neutro Abs: 3.6 10*3/uL (ref 1.4–7.7)
Neutrophils Relative %: 48.9 % (ref 43.0–77.0)
Platelets: 230 10*3/uL (ref 150.0–400.0)
RBC: 5.87 Mil/uL — ABNORMAL HIGH (ref 3.87–5.11)
RDW: 15.7 % — ABNORMAL HIGH (ref 11.5–15.5)
WBC: 7.4 10*3/uL (ref 4.0–10.5)

## 2022-10-25 LAB — TSH: TSH: 1.38 u[IU]/mL (ref 0.35–5.50)

## 2022-10-25 LAB — PROTIME-INR
INR: 1.2 ratio — ABNORMAL HIGH (ref 0.8–1.0)
Prothrombin Time: 12.4 s (ref 9.6–13.1)

## 2022-10-25 LAB — HEMOGLOBIN A1C: Hgb A1c MFr Bld: 5.6 % (ref 4.6–6.5)

## 2022-10-25 MED ORDER — HYDROXYZINE HCL 10 MG PO TABS: 10.0000 mg | ORAL_TABLET | Freq: Three times a day (TID) | ORAL | 1 refills | Status: DC | PRN

## 2022-10-25 NOTE — Patient Instructions (Addendum)
Please take all new medication as prescribed - the hydroxyzine for itching (so watch for sleepiness with this as well)  Please continue all other medications as before, and refills have been done if requested.  Please have the pharmacy call with any other refills you may need.  Please continue your efforts at being more active, low cholesterol diet, and weight control.  You are otherwise up to date with prevention measures today.  Please keep your appointments with your specialists as you may have planned  You will be contacted regarding the referral for: GI referral and CT scan  Please go to the XRAY Department in the first floor for the x-ray testing  Please go to the LAB at the blood drawing area for the tests to be done  You will be contacted by phone if any changes need to be made immediately.  Otherwise, you will receive a letter about your results with an explanation, but please check with MyChart first.  Please see Dr C in 1-2 wks as well

## 2022-10-25 NOTE — Progress Notes (Unsigned)
Patient ID: Carolyn Fowler, female   DOB: June 28, 1945, 77 y.o.   MRN: [id number]        Chief Complaint: follow up acute hepatic insufficiency symptoms x 10 days, minor RUQ pain, gerd, htn       HPI:  Carolyn Fowler is a 77 y.o. female here with daughter with above; no prior hx of this, but over 10 days with gradually worsening and stepwise noticing of symptoms of dark urine, generalized pruritus, eyes and skin yellowing, bowel urgency then pale stools with some near incontinence, loss of appetite, and minor sharp pains in the RUQ mild intermittent without radiation or fever,  and Denies worsening reflux,other abd pain, dysphagia, n/v, or blood.  No sick contacts.  Pt denies chest pain, increased sob or doe, wheezing, orthopnea, PND, increased LE swelling, palpitations, dizziness or syncope.   Pt denies polydipsia, polyuria, or new focal neuro s/s.    Pt denies recent wt loss, night sweats, loss of appetite, or other constitutional symptoms         Wt Readings from Last 3 Encounters:  10/25/22 231 lb (104.8 kg)  09/16/22 240 lb (108.9 kg)  05/31/22 240 lb (108.9 kg)   BP Readings from Last 3 Encounters:  10/25/22 124/80  09/16/22 134/80  08/10/22 (!) 140/90         Past Medical History:  Diagnosis Date   Allergy    Anemia    Arthritis    Cataract    forming    Dysrhythmia    occ palpitations- no cardiologist   Exposure to TB    1990's- was treated 2 x then, no s/s    GERD (gastroesophageal reflux disease)    History of keloid of skin    Hypertension    Neuromuscular disorder (HCC)    right leg tumbness and tingling from herniated disc   Past Surgical History:  Procedure Laterality Date   ABDOMINAL HYSTERECTOMY     APPENDECTOMY     BACK SURGERY  Aug 2013   BACK SURGERY  Aug 2016   L3 4 5 S1   COLONOSCOPY     COLONOSCOPY W/ POLYPECTOMY     KELOID EXCISION     on ear   KNEE ARTHROSCOPY     left   KNEE SURGERY     POLYPECTOMY     SHOULDER OPEN ROTATOR CUFF  REPAIR     left   TUBAL LIGATION      reports that she has never smoked. She has never used smokeless tobacco. She reports that she does not drink alcohol and does not use drugs. family history includes Arthritis in her mother; Heart disease in her father; Lupus in her daughter, granddaughter, and niece. She was adopted. Allergies  Allergen Reactions   Penicillins Itching, Swelling and Rash    Itched inside and out   Egg-Derived Products     vomiting   Tetracyclines & Related Nausea And Vomiting   Current Outpatient Medications on File Prior to Visit  Medication Sig Dispense Refill   acetaminophen (TYLENOL) 500 MG tablet Take by mouth.     amLODipine (NORVASC) 5 MG tablet Take 1 tablet (5 mg total) by mouth daily. 90 tablet 3   aspirin 81 MG tablet Take 81 mg by mouth daily.     carvedilol (COREG) 25 MG tablet Take 1 tablet (25 mg total) by mouth 2 (two) times daily. 180 tablet 3   famotidine (PEPCID) 20 MG tablet Take 1 tablet by mouth twice  daily 180 tablet 0   fluticasone (FLONASE) 50 MCG/ACT nasal spray Place 2 sprays into both nostrils daily. (Patient taking differently: Place 2 sprays into both nostrils as needed.) 16 g 6   hydrochlorothiazide (HYDRODIURIL) 25 MG tablet Take 1 tablet (25 mg total) by mouth daily. 90 tablet 3   Multiple Vitamin (MULTIVITAMIN WITH MINERALS) TABS Take 1 tablet by mouth daily.     nystatin-triamcinolone ointment (MYCOLOG) Apply 1 application topically 2 (two) times daily. 100 g 3   Propylene Glycol (SYSTANE COMPLETE OP) Apply to eye.     Sennosides (SENOKOT PO) Take by mouth as needed.      valsartan (DIOVAN) 320 MG tablet Take 1 tablet (320 mg total) by mouth daily. 90 tablet 3   No current facility-administered medications on file prior to visit.        ROS:  All others reviewed and negative.  Objective        PE:  BP 124/80 (BP Location: Right Arm, Patient Position: Sitting, Cuff Size: Normal)   Pulse 80   Temp 98.5 F (36.9 C) (Oral)   Ht  5\' 2"  (1.575 m)   Wt 231 lb (104.8 kg)   SpO2 97%   BMI 42.25 kg/m                 Constitutional: Pt appears in NAD               HENT: Head: NCAT.                Right Ear: External ear normal.                 Left Ear: External ear normal.                Eyes: . Pupils are equal, round, and reactive to light. Conjunctivae and EOM are normal               Nose: without d/c or deformity               Neck: Neck supple. Gross normal ROM               Cardiovascular: Normal rate and regular rhythm.                 Pulmonary/Chest: Effort normal and breath sounds without rales or wheezing.                Abd:  Soft, NT, ND, + BS, no organomegaly               Neurological: Pt is alert. At baseline orientation, motor grossly intact               Skin: Skin is warm. No rashes, no other new lesions, LE edema - none, but has juandice eyes and skin tone               Psychiatric: Pt behavior is normal without agitation   Micro: none  Cardiac tracings I have personally interpreted today:  none  Pertinent Radiological findings (summarize): none   Lab Results  Component Value Date   WBC 7.4 10/25/2022   HGB 13.7 10/25/2022   HCT 43.2 10/25/2022   PLT 230.0 10/25/2022   GLUCOSE 110 (H) 10/25/2022   CHOL 159 01/22/2022   TRIG 84.0 01/22/2022   HDL 49.20 01/22/2022   LDLCALC 93 01/22/2022   ALT 278 (H) 10/25/2022   AST 174 (H) 10/25/2022   NA 137  10/25/2022   K 3.3 (L) 10/25/2022   CL 95 (L) 10/25/2022   CREATININE 0.68 10/25/2022   BUN 11 10/25/2022   CO2 29 10/25/2022   TSH 1.38 10/25/2022   INR 1.2 (H) 10/25/2022   HGBA1C 5.6 10/25/2022   Assessment/Plan:  Carolyn Fowler is a 77 y.o. Black or African American [2] female with  has a past medical history of Allergy, Anemia, Arthritis, Cataract, Dysrhythmia, Exposure to TB, GERD (gastroesophageal reflux disease), History of keloid of skin, Hypertension, and Neuromuscular disorder (HCC).  Essential hypertension BP  Readings from Last 3 Encounters:  10/25/22 124/80  09/16/22 134/80  08/10/22 (!) 140/90   Stable, pt to continue medical treatment norvasc 5 every day, coreg 25 bid, hct 25 every day, diovan 320 qd   GERD (gastroesophageal reflux disease) Stable overall, cont pepcid  Pre-diabetes Lab Results  Component Value Date   HGBA1C 5.6 10/25/2022   Stable, pt to continue current medical treatment  - diet, wt control   Jaundice New onset with pruritus and minimal pain, o/w relatively asympt but likely hepatic related - for labs today to confirm, CT abd pelvis, and refer GI, also atarax 10 tid prn itching  RUQ pain Minor, for CT as above, also for cxr  Followup: Return if symptoms worsen or fail to improve.  Oliver Barre, MD 10/27/2022 10:05 AM [facility]

## 2022-10-26 ENCOUNTER — Encounter: Payer: Self-pay | Admitting: Internal Medicine

## 2022-10-26 ENCOUNTER — Telehealth: Payer: Self-pay | Admitting: Internal Medicine

## 2022-10-26 ENCOUNTER — Telehealth: Payer: Self-pay | Admitting: Cardiovascular Disease

## 2022-10-26 LAB — HEPATIC FUNCTION PANEL
ALT: 278 U/L — ABNORMAL HIGH (ref 0–35)
AST: 174 U/L — ABNORMAL HIGH (ref 0–37)
Albumin: 4 g/dL (ref 3.5–5.2)
Alkaline Phosphatase: 290 U/L — ABNORMAL HIGH (ref 39–117)
Bilirubin, Direct: 13.9 mg/dL — ABNORMAL HIGH (ref 0.0–0.3)
Total Bilirubin: 18.9 mg/dL — ABNORMAL HIGH (ref 0.2–1.2)
Total Protein: 7.7 g/dL (ref 6.0–8.3)

## 2022-10-26 LAB — URINALYSIS, ROUTINE W REFLEX MICROSCOPIC

## 2022-10-26 LAB — BASIC METABOLIC PANEL
BUN: 11 mg/dL (ref 6–23)
CO2: 29 meq/L (ref 19–32)
Calcium: 9.8 mg/dL (ref 8.4–10.5)
Chloride: 95 meq/L — ABNORMAL LOW (ref 96–112)
Creatinine, Ser: 0.68 mg/dL (ref 0.40–1.20)
GFR: 84.78 mL/min (ref >60.00)
Glucose, Bld: 110 mg/dL — ABNORMAL HIGH (ref 70–99)
Potassium: 3.3 meq/L — ABNORMAL LOW (ref 3.5–5.1)
Sodium: 137 meq/L (ref 135–145)

## 2022-10-26 LAB — LIPASE: Lipase: 6 U/L — ABNORMAL LOW (ref 11.0–59.0)

## 2022-10-26 NOTE — Telephone Encounter (Signed)
Patient's daughter Ezequiel Kayser called and states that the patients stools are turning white today - please advise.  Can call daughter at:  [phone]

## 2022-10-26 NOTE — Telephone Encounter (Signed)
Pt calling back from phone on 8/12 phone note and would like to speak with Melissa. Please advise.

## 2022-10-26 NOTE — Telephone Encounter (Signed)
Returned call to pt and spoke with her daughter. She wanted to provide Melissa with an update that it wasn't her BP meds that were causing itching, it was her liver. Looks like pt saw PCP yesterday for jaundice and is being referred to GI. She has resumed her carvedilol and stopped the metoprolol as it was working better for her BP (previously changed from carvedilol back to metoprolol 8/12 phone note to see if the carvedilol had been causing her itching). Med list has carvedilol listed already.

## 2022-10-27 ENCOUNTER — Inpatient Hospital Stay (HOSPITAL_COMMUNITY)
Admission: EM | Admit: 2022-10-27 | Discharge: 2022-11-06 | DRG: 445 | Disposition: A | Discharge status: Hospice | Payer: Medicare PPO | Attending: Family Medicine | Admitting: Family Medicine

## 2022-10-27 ENCOUNTER — Telehealth: Payer: Self-pay | Admitting: Gastroenterology

## 2022-10-27 ENCOUNTER — Encounter: Payer: Self-pay | Admitting: Internal Medicine

## 2022-10-27 ENCOUNTER — Encounter (HOSPITAL_COMMUNITY): Payer: Self-pay

## 2022-10-27 ENCOUNTER — Other Ambulatory Visit: Payer: Self-pay

## 2022-10-27 ENCOUNTER — Emergency Department (HOSPITAL_COMMUNITY): Payer: Medicare PPO

## 2022-10-27 ENCOUNTER — Telehealth: Payer: Self-pay | Admitting: Internal Medicine

## 2022-10-27 DIAGNOSIS — Z66 Do not resuscitate: Secondary | ICD-10-CM | POA: Diagnosis not present

## 2022-10-27 DIAGNOSIS — I10 Essential (primary) hypertension: Secondary | ICD-10-CM | POA: Diagnosis not present

## 2022-10-27 DIAGNOSIS — Z0389 Encounter for observation for other suspected diseases and conditions ruled out: Secondary | ICD-10-CM | POA: Diagnosis not present

## 2022-10-27 DIAGNOSIS — Z7982 Long term (current) use of aspirin: Secondary | ICD-10-CM | POA: Diagnosis not present

## 2022-10-27 DIAGNOSIS — R17 Unspecified jaundice: Secondary | ICD-10-CM | POA: Diagnosis not present

## 2022-10-27 DIAGNOSIS — C7801 Secondary malignant neoplasm of right lung: Secondary | ICD-10-CM | POA: Diagnosis present

## 2022-10-27 DIAGNOSIS — Z6841 Body Mass Index (BMI) 40.0 and over, adult: Secondary | ICD-10-CM

## 2022-10-27 DIAGNOSIS — K219 Gastro-esophageal reflux disease without esophagitis: Secondary | ICD-10-CM | POA: Diagnosis present

## 2022-10-27 DIAGNOSIS — L299 Pruritus, unspecified: Secondary | ICD-10-CM | POA: Diagnosis present

## 2022-10-27 DIAGNOSIS — C220 Liver cell carcinoma: Secondary | ICD-10-CM | POA: Diagnosis not present

## 2022-10-27 DIAGNOSIS — Z8261 Family history of arthritis: Secondary | ICD-10-CM

## 2022-10-27 DIAGNOSIS — Z9049 Acquired absence of other specified parts of digestive tract: Secondary | ICD-10-CM | POA: Diagnosis not present

## 2022-10-27 DIAGNOSIS — Z880 Allergy status to penicillin: Secondary | ICD-10-CM

## 2022-10-27 DIAGNOSIS — B9620 Unspecified Escherichia coli [E. coli] as the cause of diseases classified elsewhere: Secondary | ICD-10-CM | POA: Diagnosis present

## 2022-10-27 DIAGNOSIS — K6389 Other specified diseases of intestine: Secondary | ICD-10-CM

## 2022-10-27 DIAGNOSIS — D509 Iron deficiency anemia, unspecified: Secondary | ICD-10-CM | POA: Diagnosis not present

## 2022-10-27 DIAGNOSIS — R935 Abnormal findings on diagnostic imaging of other abdominal regions, including retroperitoneum: Secondary | ICD-10-CM | POA: Diagnosis not present

## 2022-10-27 DIAGNOSIS — K831 Obstruction of bile duct: Secondary | ICD-10-CM | POA: Diagnosis not present

## 2022-10-27 DIAGNOSIS — R1011 Right upper quadrant pain: Secondary | ICD-10-CM

## 2022-10-27 DIAGNOSIS — R7989 Other specified abnormal findings of blood chemistry: Secondary | ICD-10-CM | POA: Diagnosis not present

## 2022-10-27 DIAGNOSIS — Z90710 Acquired absence of both cervix and uterus: Secondary | ICD-10-CM | POA: Diagnosis not present

## 2022-10-27 DIAGNOSIS — I517 Cardiomegaly: Secondary | ICD-10-CM | POA: Diagnosis not present

## 2022-10-27 DIAGNOSIS — G8929 Other chronic pain: Secondary | ICD-10-CM | POA: Diagnosis present

## 2022-10-27 DIAGNOSIS — N390 Urinary tract infection, site not specified: Secondary | ICD-10-CM | POA: Diagnosis not present

## 2022-10-27 DIAGNOSIS — M1990 Unspecified osteoarthritis, unspecified site: Secondary | ICD-10-CM | POA: Diagnosis present

## 2022-10-27 DIAGNOSIS — Z79899 Other long term (current) drug therapy: Secondary | ICD-10-CM | POA: Diagnosis not present

## 2022-10-27 DIAGNOSIS — D7589 Other specified diseases of blood and blood-forming organs: Secondary | ICD-10-CM | POA: Diagnosis not present

## 2022-10-27 DIAGNOSIS — K8020 Calculus of gallbladder without cholecystitis without obstruction: Secondary | ICD-10-CM | POA: Diagnosis not present

## 2022-10-27 DIAGNOSIS — E876 Hypokalemia: Secondary | ICD-10-CM | POA: Diagnosis present

## 2022-10-27 DIAGNOSIS — Z8249 Family history of ischemic heart disease and other diseases of the circulatory system: Secondary | ICD-10-CM | POA: Diagnosis not present

## 2022-10-27 DIAGNOSIS — C7802 Secondary malignant neoplasm of left lung: Secondary | ICD-10-CM | POA: Diagnosis not present

## 2022-10-27 DIAGNOSIS — Z515 Encounter for palliative care: Secondary | ICD-10-CM

## 2022-10-27 DIAGNOSIS — R160 Hepatomegaly, not elsewhere classified: Secondary | ICD-10-CM | POA: Diagnosis present

## 2022-10-27 DIAGNOSIS — I714 Abdominal aortic aneurysm, without rupture, unspecified: Secondary | ICD-10-CM | POA: Diagnosis present

## 2022-10-27 DIAGNOSIS — Z881 Allergy status to other antibiotic agents status: Secondary | ICD-10-CM

## 2022-10-27 DIAGNOSIS — R932 Abnormal findings on diagnostic imaging of liver and biliary tract: Secondary | ICD-10-CM | POA: Diagnosis not present

## 2022-10-27 DIAGNOSIS — Z86010 Personal history of colonic polyps: Secondary | ICD-10-CM | POA: Diagnosis not present

## 2022-10-27 DIAGNOSIS — C772 Secondary and unspecified malignant neoplasm of intra-abdominal lymph nodes: Secondary | ICD-10-CM | POA: Diagnosis not present

## 2022-10-27 DIAGNOSIS — C221 Intrahepatic bile duct carcinoma: Secondary | ICD-10-CM | POA: Diagnosis not present

## 2022-10-27 DIAGNOSIS — R918 Other nonspecific abnormal finding of lung field: Secondary | ICD-10-CM | POA: Diagnosis not present

## 2022-10-27 DIAGNOSIS — Z91012 Allergy to eggs: Secondary | ICD-10-CM

## 2022-10-27 DIAGNOSIS — C801 Malignant (primary) neoplasm, unspecified: Secondary | ICD-10-CM | POA: Diagnosis not present

## 2022-10-27 DIAGNOSIS — J984 Other disorders of lung: Secondary | ICD-10-CM | POA: Diagnosis not present

## 2022-10-27 DIAGNOSIS — K769 Liver disease, unspecified: Secondary | ICD-10-CM | POA: Diagnosis not present

## 2022-10-27 DIAGNOSIS — K8021 Calculus of gallbladder without cholecystitis with obstruction: Secondary | ICD-10-CM | POA: Diagnosis not present

## 2022-10-27 DIAGNOSIS — R222 Localized swelling, mass and lump, trunk: Secondary | ICD-10-CM | POA: Diagnosis not present

## 2022-10-27 DIAGNOSIS — R509 Fever, unspecified: Secondary | ICD-10-CM | POA: Diagnosis not present

## 2022-10-27 DIAGNOSIS — Z7189 Other specified counseling: Secondary | ICD-10-CM | POA: Diagnosis not present

## 2022-10-27 LAB — COMPREHENSIVE METABOLIC PANEL
ALT: 245 U/L — ABNORMAL HIGH (ref 0–44)
AST: 175 U/L — ABNORMAL HIGH (ref 15–41)
Albumin: 3 g/dL — ABNORMAL LOW (ref 3.5–5.0)
Alkaline Phosphatase: 257 U/L — ABNORMAL HIGH (ref 38–126)
Anion gap: 13 (ref 5–15)
BUN: 10 mg/dL (ref 8–23)
CO2: 25 mmol/L (ref 22–32)
Calcium: 9.4 mg/dL (ref 8.9–10.3)
Chloride: 94 mmol/L — ABNORMAL LOW (ref 98–111)
Creatinine, Ser: 0.47 mg/dL (ref 0.44–1.00)
GFR, Estimated: >60 mL/min (ref >60)
Glucose, Bld: 116 mg/dL — ABNORMAL HIGH (ref 70–99)
Potassium: 3.3 mmol/L — ABNORMAL LOW (ref 3.5–5.1)
Sodium: 132 mmol/L — ABNORMAL LOW (ref 135–145)
Total Bilirubin: 19.5 mg/dL (ref 0.3–1.2)
Total Protein: 7.2 g/dL (ref 6.5–8.1)

## 2022-10-27 LAB — CBC
HCT: 40 % (ref 36.0–46.0)
Hemoglobin: 13.1 g/dL (ref 12.0–15.0)
MCH: 23.4 pg — ABNORMAL LOW (ref 26.0–34.0)
MCHC: 32.8 g/dL (ref 30.0–36.0)
MCV: 71.3 fL — ABNORMAL LOW (ref 80.0–100.0)
Platelets: 241 10*3/uL (ref 150–400)
RBC: 5.61 MIL/uL — ABNORMAL HIGH (ref 3.87–5.11)
RDW: 17.4 % — ABNORMAL HIGH (ref 11.5–15.5)
WBC: 6.3 10*3/uL (ref 4.0–10.5)
nRBC: 0 % (ref 0.0–0.2)

## 2022-10-27 LAB — HEPATIC FUNCTION PANEL
ALT: 236 U/L — ABNORMAL HIGH (ref 0–35)
AST: 165 U/L — ABNORMAL HIGH (ref 0–37)
Albumin: 3.6 g/dL (ref 3.5–5.2)
Alkaline Phosphatase: 286 U/L — ABNORMAL HIGH (ref 39–117)
Bilirubin, Direct: 11.2 mg/dL — ABNORMAL HIGH (ref 0.0–0.3)
Total Bilirubin: 19.2 mg/dL — ABNORMAL HIGH (ref 0.2–1.2)
Total Protein: 6.9 g/dL (ref 6.0–8.3)

## 2022-10-27 LAB — PROTIME-INR
INR: 1.1 (ref 0.8–1.2)
Prothrombin Time: 14 seconds (ref 11.4–15.2)

## 2022-10-27 LAB — LIPASE, BLOOD: Lipase: 20 U/L (ref 11–51)

## 2022-10-27 LAB — URINALYSIS, ROUTINE W REFLEX MICROSCOPIC
Bacteria, UA: NONE SEEN
Glucose, UA: NEGATIVE mg/dL
Hgb urine dipstick: NEGATIVE
Ketones, ur: NEGATIVE mg/dL
Nitrite: NEGATIVE
Protein, ur: 30 mg/dL — AB
Specific Gravity, Urine: 1.017 (ref 1.005–1.030)
pH: 5 (ref 5.0–8.0)

## 2022-10-27 LAB — AMMONIA: Ammonia: 38 umol/L — ABNORMAL HIGH (ref 9–35)

## 2022-10-27 LAB — BILIRUBIN, DIRECT: Bilirubin, Direct: 12.8 mg/dL — ABNORMAL HIGH (ref 0.0–0.2)

## 2022-10-27 MED ORDER — IRBESARTAN 150 MG PO TABS
300.0000 mg | ORAL_TABLET | Freq: Every day | ORAL | Status: DC
  Administered 2022-10-28 – 2022-11-06 (×9): 300 mg via ORAL
  Filled 2022-10-27 (×6): qty 2
  Filled 2022-10-27: qty 1
  Filled 2022-10-27 (×3): qty 2

## 2022-10-27 MED ORDER — SODIUM CHLORIDE 0.9 % IV BOLUS
1000.0000 mL | Freq: Once | INTRAVENOUS | Status: AC
  Administered 2022-10-27: 1000 mL via INTRAVENOUS

## 2022-10-27 MED ORDER — ACETAMINOPHEN 500 MG PO TABS: 500.0000 mg | ORAL_TABLET | Freq: Four times a day (QID) | ORAL | Status: DC | PRN

## 2022-10-27 MED ORDER — AMLODIPINE BESYLATE 5 MG PO TABS
5.0000 mg | ORAL_TABLET | Freq: Every day | ORAL | Status: DC
  Administered 2022-10-28 – 2022-11-06 (×8): 5 mg via ORAL
  Filled 2022-10-27 (×9): qty 1

## 2022-10-27 MED ORDER — ACETAMINOPHEN 650 MG RE SUPP: 650.0000 mg | Freq: Four times a day (QID) | RECTAL | Status: DC | PRN

## 2022-10-27 MED ORDER — GADOBUTROL 1 MMOL/ML IV SOLN
10.0000 mL | Freq: Once | INTRAVENOUS | Status: AC | PRN
  Administered 2022-10-27: 10 mL via INTRAVENOUS

## 2022-10-27 MED ORDER — FAMOTIDINE 20 MG PO TABS
20.0000 mg | ORAL_TABLET | Freq: Two times a day (BID) | ORAL | Status: DC
  Administered 2022-10-27 – 2022-11-06 (×18): 20 mg via ORAL
  Filled 2022-10-27 (×20): qty 1

## 2022-10-27 MED ORDER — TRAZODONE HCL 50 MG PO TABS
25.0000 mg | ORAL_TABLET | Freq: Every evening | ORAL | Status: DC | PRN
  Filled 2022-10-27: qty 1

## 2022-10-27 MED ORDER — HEPARIN SODIUM (PORCINE) 5000 UNIT/ML IJ SOLN: 5000.0000 [IU] | Freq: Three times a day (TID) | INTRAMUSCULAR | Status: DC

## 2022-10-27 MED ORDER — HYDROXYZINE HCL 10 MG PO TABS
10.0000 mg | ORAL_TABLET | Freq: Three times a day (TID) | ORAL | Status: DC
  Administered 2022-10-27 – 2022-11-06 (×26): 10 mg via ORAL
  Filled 2022-10-27 (×29): qty 1

## 2022-10-27 MED ORDER — CARVEDILOL 25 MG PO TABS
25.0000 mg | ORAL_TABLET | Freq: Two times a day (BID) | ORAL | Status: DC
  Administered 2022-10-27 – 2022-11-06 (×16): 25 mg via ORAL
  Filled 2022-10-27 (×10): qty 1
  Filled 2022-10-27: qty 2
  Filled 2022-10-27 (×9): qty 1

## 2022-10-27 MED ORDER — SENNA 8.6 MG PO TABS
1.0000 | ORAL_TABLET | Freq: Two times a day (BID) | ORAL | Status: DC
  Administered 2022-10-28 – 2022-11-03 (×7): 8.6 mg via ORAL
  Filled 2022-10-27 (×15): qty 1

## 2022-10-27 MED ORDER — DEXTROSE-SODIUM CHLORIDE 5-0.45 % IV SOLN: INTRAVENOUS | Status: DC

## 2022-10-27 MED ORDER — IOHEXOL 300 MG/ML  SOLN
100.0000 mL | Freq: Once | INTRAMUSCULAR | Status: AC | PRN
  Administered 2022-10-27: 100 mL via INTRAVENOUS

## 2022-10-27 MED ORDER — HYDROCHLOROTHIAZIDE 25 MG PO TABS
25.0000 mg | ORAL_TABLET | Freq: Every day | ORAL | Status: DC
  Administered 2022-10-28 – 2022-11-05 (×8): 25 mg via ORAL
  Filled 2022-10-27 (×9): qty 1

## 2022-10-27 MED ORDER — ACETAMINOPHEN 325 MG PO TABS
650.0000 mg | ORAL_TABLET | Freq: Four times a day (QID) | ORAL | Status: DC | PRN
  Administered 2022-11-02 – 2022-11-04 (×4): 650 mg via ORAL
  Filled 2022-10-27 (×5): qty 2

## 2022-10-27 NOTE — Telephone Encounter (Signed)
Agree with ED evaluation today

## 2022-10-27 NOTE — Telephone Encounter (Signed)
This is typical for the liver problem she has, and by itself is not harmful.  Please repeat the liver testing and hopefully they will hear soon about the CT scan and GI referral.

## 2022-10-27 NOTE — H&P (Signed)
History and Physical    Carolyn Fowler:[id number] DOB: 22-Jun-1945 DOA: 10/27/2022  DOS: the patient was seen and examined on 10/27/2022  PCP: Myrlene Broker, MD   Patient coming from: Home  I have personally briefly reviewed patient's old medical records in [facility]  Carolyn Fowler is a 77 y.o. with medical h/o HTN, LVH, chronic low back pain reports that over the past 10 days she has been more lethargic and noticed progressive symptoms of symptoms of dark urine, generalized pruritus, eyes and skin yellowing, bowel urgency then pale stools with some near incontinence, loss of appetite, and minor sharp pains in the RUQ mild intermittent without radiation or fever. She denies worsening reflux,other abd pain, dysphagia, n/v, or blood in stool. She has no prior h/o liver disease or pancreatic disease. She presented to [facility] and saw Dr. Jonny Ruiz. She was diagnosed with painless jaundice. Appropriate labs and imaging were ordered revealing abnormal liver functions with elevated total and direct bilirubin, CT revealing ill defined hypdense porta hepatic hypodense mass 5.8 x 4.7 cm wwwwwwwith encasement of hepatic portal veings, hepatic arteries, common bile duct with assoicated cholecystitis. Also revealed were large necrotic porta hepatic lymph nodes. She was instructed to present to [facility] for further evaluation.     ED Course: 98.4  119/81  HR 66  18. Patient in no distress but concerned. Denies discomfort. Lab: K 3.3, Alk phos 257, AST 175 ALT 245, NH3 38 T Bili 19.5, D. Bili 12.8, INR 1.1. CT as per HPI. TRH called to admit for continued evaluation of hepatic mass and management of medical problems  Review of Systems:  Review of Systems  Constitutional:  Positive for malaise/fatigue and weight loss. Negative for chills, diaphoresis and fever.  HENT: Negative.    Eyes: Negative.   Respiratory: Negative.    Cardiovascular:  Negative for chest  pain and palpitations.  Gastrointestinal:  Negative for abdominal pain, nausea and vomiting.       Clay colored stools  Genitourinary:  Positive for urgency. Negative for dysuria.       Very dark urine  Musculoskeletal:  Positive for back pain.  Skin:  Positive for itching.       Jaundice -   Neurological: Negative.   Endo/Heme/Allergies:  Does not bruise/bleed easily.  Psychiatric/Behavioral: Negative.      Past Medical History:  Diagnosis Date   Allergy    Anemia    Arthritis    Cataract    forming    Dysrhythmia    occ palpitations- no cardiologist   Exposure to TB    1990's- was treated 2 x then, no s/s    GERD (gastroesophageal reflux disease)    History of keloid of skin    Hypertension    Neuromuscular disorder (HCC)    right leg tumbness and tingling from herniated disc    Past Surgical History:  Procedure Laterality Date   ABDOMINAL HYSTERECTOMY     APPENDECTOMY     BACK SURGERY  Aug 2013   BACK SURGERY  Aug 2016   L3 4 5 S1   COLONOSCOPY     COLONOSCOPY W/ POLYPECTOMY     KELOID EXCISION     on ear   KNEE ARTHROSCOPY     left   KNEE SURGERY     POLYPECTOMY     SHOULDER OPEN ROTATOR CUFF REPAIR     left   TUBAL LIGATION      Soc  Hx -  [employer], [employer] BA, Master's degree vocational ed. Married - '66- '79/divorced; married '04, widowed 2018.  sons - '67, '74; 3 daughters - '71, '74, '74; 18 grandchilderns; 18 great-grandchildren. Work - retired from Radiation protection practitioner.           reports that she has never smoked. She has never used smokeless tobacco. She reports that she does not drink alcohol and does not use drugs.  Allergies  Allergen Reactions   Penicillins Itching, Swelling, Rash and Other (See Comments)    "Itched inside and out"   Egg-Derived Products Nausea And Vomiting   Tetracyclines & Related Nausea And Vomiting    Family History  Adopted: Yes  Problem Relation Age of Onset   Arthritis Mother    Heart disease Father    Lupus  Daughter    Lupus Granddaughter    Lupus Niece     Prior to Admission medications   Medication Sig Start Date End Date Taking? Authorizing Provider  acetaminophen (TYLENOL) 500 MG tablet Take 500 mg by mouth every 6 (six) hours as needed for mild pain or headache. 10/23/14  Yes [provider]  amLODipine (NORVASC) 5 MG tablet Take 1 tablet (5 mg total) by mouth daily. 07/20/22  Yes Wendall Stade, MD  aspirin 81 MG tablet Take 81 mg by mouth daily.   Yes [provider]  carvedilol (COREG) 25 MG tablet Take 1 tablet (25 mg total) by mouth 2 (two) times daily. 08/10/22 11/08/22 Yes Wendall Stade, MD  famotidine (PEPCID) 20 MG tablet Take 1 tablet by mouth twice daily Patient taking differently: Take 20 mg by mouth 2 (two) times daily. 10/20/18  Yes Myrlene Broker, MD  fluticasone [facility]) 50 MCG/ACT nasal spray Place 2 sprays into both nostrils daily. Patient taking differently: Place 2 sprays into both nostrils daily as needed for allergies or rhinitis. 08/24/18  Yes Myrlene Broker, MD  hydrochlorothiazide (HYDRODIURIL) 25 MG tablet Take 1 tablet (25 mg total) by mouth daily. 05/31/22  Yes Wendall Stade, MD  hydrOXYzine (ATARAX) 10 MG tablet Take 1 tablet (10 mg total) by mouth 3 (three) times daily as needed. Patient taking differently: Take 10 mg by mouth 3 (three) times daily. 10/25/22  Yes Corwin Levins, MD  Multiple Vitamin (MULTIVITAMIN WITH MINERALS) TABS Take 1 tablet by mouth daily.   Yes [provider]  nystatin-triamcinolone ointment (MYCOLOG) Apply 1 application topically 2 (two) times daily. Patient taking differently: Apply 1 application  topically 2 (two) times daily as needed (for irritation- affected areas). 12/14/18  Yes Myrlene Broker, MD  Propylene Glycol (SYSTANE COMPLETE OP) Place 1 drop into both eyes 3 (three) times daily as needed (for irritation).   Yes [provider]  senna (SENOKOT) 8.6 MG TABS tablet Take 1  tablet by mouth daily as needed for mild constipation.   Yes [provider]  valsartan (DIOVAN) 320 MG tablet Take 1 tablet (320 mg total) by mouth daily. 05/31/22  Yes Wendall Stade, MD    Physical Exam: Vitals:   10/27/22 1614 10/27/22 1800 10/27/22 2012 10/27/22 2130  BP:  118/70 119/81 126/80  Pulse:  75 66 77  Resp:  18 18 17   Temp:   98.4 F (36.9 C)   TempSrc:   Oral   SpO2:  96% 93% 99%  Weight: 105.2 kg     Height: 5\' 2"  (1.575 m)       Physical Exam Vitals and nursing  note reviewed.  Constitutional:      General: She is not in acute distress.    Appearance: She is obese. She is not ill-appearing or diaphoretic.  HENT:     Head: Normocephalic and atraumatic.     Nose: Nose normal.     Mouth/Throat:     Mouth: Mucous membranes are moist.     Pharynx: Oropharynx is clear.     Comments: Native dentition Eyes:     General: Scleral icterus present.        Right eye: No discharge.        Left eye: No discharge.     Extraocular Movements: Extraocular movements intact.     Pupils: Pupils are equal, round, and reactive to light.  Cardiovascular:     Rate and Rhythm: Normal rate. Rhythm irregular.     Pulses: Normal pulses.     Comments: Heart sounds more pronounce RSB, no click or mm  Heart rate irregular with frequent Premature beats, bigeminy Pulmonary:     Effort: Pulmonary effort is normal.     Breath sounds: Normal breath sounds.  Abdominal:     General: There is no distension.     Palpations: Abdomen is soft. There is no mass.     Tenderness: There is no abdominal tenderness. There is no guarding.     Comments: Protuberant abdomen 2/2 obesity. No palpable liver edge or RUQ tenderness  Musculoskeletal:        General: No swelling. Normal range of motion.     Cervical back: Normal range of motion and neck supple. No rigidity or tenderness.     Right lower leg: No edema.     Left lower leg: No edema.  Lymphadenopathy:     Cervical: No cervical  adenopathy.  Skin:    General: Skin is warm and dry.     Coloration: Skin is jaundiced.  Neurological:     General: No focal deficit present.     Mental Status: She is alert and oriented to person, place, and time.     Cranial Nerves: No cranial nerve deficit.  Psychiatric:        Mood and Affect: Mood normal.        Behavior: Behavior normal.      Labs on Admission: I have personally reviewed following labs and imaging studies  CBC: Recent Labs  Lab 10/25/22 1613 10/27/22 1641  WBC 7.4 6.3  NEUTROABS 3.6  --   HGB 13.7 13.1  HCT 43.2 40.0  MCV 73.5* 71.3*  PLT 230.0 241   Basic Metabolic Panel: Recent Labs  Lab 10/25/22 1613 10/27/22 1641  NA 137 132*  K 3.3* 3.3*  CL 95* 94*  CO2 29 25  GLUCOSE 110* 116*  BUN 11 10  CREATININE 0.68 0.47  CALCIUM 9.8 9.4   GFR: Estimated Creatinine Clearance: 68.1 mL/min (by C-G formula based on SCr of 0.47 mg/dL). Liver Function Tests: Recent Labs  Lab 10/25/22 1613 10/27/22 1530 10/27/22 1641  AST 174* 165* 175*  ALT 278* 236* 245*  ALKPHOS 290* 286* 257*  BILITOT 18.9* 19.2* 19.5*  PROT 7.7 6.9 7.2  ALBUMIN 4.0 3.6 3.0*   Recent Labs  Lab 10/25/22 1613 10/27/22 1751  LIPASE 6.0* 20   Recent Labs  Lab 10/27/22 1817  AMMONIA 38*   Coagulation Profile: Recent Labs  Lab 10/25/22 1613 10/27/22 1817  INR 1.2* 1.1   Cardiac Enzymes: No results for input(s): "CKTOTAL", "CKMB", "CKMBINDEX", "TROPONINI" in the last 168  hours. BNP (last 3 results) Recent Labs    05/31/22 1455  PROBNP 253   HbA1C: Recent Labs    10/25/22 1613  HGBA1C 5.6   CBG: No results for input(s): "GLUCAP" in the last 168 hours. Lipid Profile: No results for input(s): "CHOL", "HDL", "LDLCALC", "TRIG", "CHOLHDL", "LDLDIRECT" in the last 72 hours. Thyroid Function Tests: Recent Labs    10/25/22 1613  TSH 1.38   Anemia Panel: No results for input(s): "VITAMINB12", "FOLATE", "FERRITIN", "TIBC", "IRON", "RETICCTPCT" in the  last 72 hours. Urine analysis:    Component Value Date/Time   COLORURINE AMBER (A) 10/27/2022 1616   APPEARANCEUR CLOUDY (A) 10/27/2022 1616   LABSPEC 1.017 10/27/2022 1616   PHURINE 5.0 10/27/2022 1616   GLUCOSEU NEGATIVE 10/27/2022 1616   GLUCOSEU Color Interference (A) 10/25/2022 1613   HGBUR NEGATIVE 10/27/2022 1616   BILIRUBINUR MODERATE (A) 10/27/2022 1616   BILIRUBINUR neg 12/06/2013 1056   KETONESUR NEGATIVE 10/27/2022 1616   PROTEINUR 30 (A) 10/27/2022 1616   UROBILINOGEN Color Interference (A) 10/25/2022 1613   NITRITE NEGATIVE 10/27/2022 1616   LEUKOCYTESUR TRACE (A) 10/27/2022 1616    Radiological Exams on Admission: I have personally reviewed images CT CHEST ABDOMEN PELVIS W CONTRAST  Result Date: 10/27/2022 CLINICAL DATA:  Metastatic disease evaluation. Patient with hepatic obstruction. Increasing fatigue, weight loss and poor appetite. EXAM: CT CHEST, ABDOMEN, AND PELVIS WITH CONTRAST TECHNIQUE: Multidetector CT imaging of the chest, abdomen and pelvis was performed following the standard protocol during bolus administration of intravenous contrast. RADIATION DOSE REDUCTION: This exam was performed according to the departmental dose-optimization program which includes automated exposure control, adjustment of the mA and/or kV according to patient size and/or use of iterative reconstruction technique. CONTRAST:  OMNIPAQUE IOHEXOL 300 MG/ML  SOLN COMPARISON:  PA Lat chest 10/25/2022, chest CT no contrast 03/31/2022. No prior abdomen pelvis CT. FINDINGS: CT CHEST FINDINGS Cardiovascular: There is mild cardiomegaly with heavy calcification in the inferolateral mitral ring. There is no pericardial effusion. Minimal two-vessel coronary calcifications LAD and circumflex. Pulmonary trunk dilatation 4.4 cm is again noted indicating arterial hypertension. No central embolus is seen. Mild prominence of the central pulmonary veins is again noted but unchanged. There is chronic  tortuosity and dilatation of the aorta, tortuous and ectatic great vessels exaggerating the superior mediastinum. There are scattered calcifications in the arch and descending aorta. The aortic root is 3.9 cm at the sinuses of Valsalva and again measures 4.3 cm in the ascending segment, 3.8 cm in the arch and descending segments. Mediastinum/Nodes: There are a few bilateral subcentimeter hypodense nodules in the lower poles of the thyroid gland but no nodules requiring imaging follow-up. Axillary spaces are clear. There is no intrathoracic adenopathy. Small hiatal hernia with unremarkable thoracic esophagus, thoracic trachea. Both main bronchi are clear. Lungs/Pleura: Mild chronic elevation right hemidiaphragm. There is left lower lobe medial basal linear scarring associated with mild bronchiectasis. Additional scattered scar-like opacities both bases are again noted and mild subpleural reticulation in the right middle and lower lobe bases. There is a calcified left lower lobe granuloma. There is an 8 mm pleural-based right lower lobe nodule laterally on 6:72 which was previously 4 mm, with linear scarring or atelectasis again extending medially from the nodule towards the hilum. There is a stable 3 mm subpleural nodule in the right middle lobe laterally on 6:74. Not seen previously, 2 nodules each measuring 2 mm are noted in the anterior aspect of the left upper lobe on 6:45 and 47.  There is a 2 mm right upper lobe nodule laterally on 6:42 which was seen previously, unchanged. No other new nodules are seen. There are no active infiltrates, no pleural effusion or pneumothorax. Musculoskeletal: There is osteopenia, lower thoracic spine bridging enthesopathy and multilevel degenerative thoracic discs with mild kyphodextroscoliosis. No aggressive regional bone lesion is seen. There are dystrophic calcifications in the anterior left chest wall with no visible chest wall mass. CT ABDOMEN PELVIS FINDINGS Hepatobiliary:  Not seen on 03/31/2022 there is an ill-defined hypodense porta hepatis mass, on 2:49 estimated to measure as much as 5.8 x 4.7 cm. The mass encases and severely narrows the hepatic portal main vein, the right hepatic portal vein origin, and the proximal 1 cm of left hepatic portal vein. Faint contrast is still seen in these vessels distal to the narrowing. The mass encases the main hepatic artery and both the proximal left and right hepatic arteries but does not seem to narrow them. The mass is difficult to separate from the overlying caudate hepatic lobe but I suspect is of biliary origin as the common hepatic duct appears to terminate in the mass. There is moderate to severe intrahepatic biliary dilatation above this. The common bile duct is encased and severely narrowed above the level of the pancreas. Except where the caudate hepatic lobe abuts and is difficult to separate from the underlying mass, the rest of the liver enhances homogeneously without a visible mass. The gallbladder is dilated and thick-walled, distended up to 12 cm in length. This is probably due to obstructive cholecystitis. The cystic duct is not seen. There is trace pericholecystic fluid. Pancreas: There are enlarged periportal lymph nodes abutting the anterior and posterior head of the pancreas, with the pancreas itself enhances homogeneously without a visible mass and no ductal dilatation or inflammatory change. Spleen: No abnormality.  No splenomegaly. Adrenals/Urinary Tract: Chronic slight nodular thickening both adrenal glands, was also noted on the CTA chest from 09/13/2009 and unchanged. The kidneys enhance homogeneously except for a 1.3 cm homogeneous cyst in the superior pole of the left kidney, Hounsfield density is -2. No follow-up imaging is needed. There is no urinary stone or obstruction. The bladder unremarkable for the degree of distention. Stomach/Bowel: No dilatation or wall thickening. Uncomplicated sigmoid diverticulosis.  Vascular/Lymphatic: There is patchy aortoiliac atherosclerosis without AAA. The iliac arteries are tortuous but clear. There are partially necrotic porta hepatis lymph nodes, 1 noted abutting the anterior head of pancreas on 2:50 measuring 3.3 x 1.7 cm, and another abutting the posterior head of pancreas measuring 2.7 x 1.6 cm on the same image. There are few mildly prominent left para-aortic chain nodes up to 1.3 cm in short axis. No pelvic or mesenteric adenopathy. Reproductive: Status post hysterectomy. No adnexal masses. Other: In the left paracolic gutter just inferior to the tip of the liver, there is a unilocular thin walled homogeneous ovoid simple appearing fluid collection measuring 7 x 4.5 x 6.4 cm, Hounsfield density is 23. This abuts and deforms the lateral wall of the mid to distal ascending colon. 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 There are no other fluid collections. There is no free ascites, free air, or free hemorrhage, and no incarcerated hernia. Musculoskeletal: There is lumbar spinal fusion hardware L3-S1, osteopenia, degenerative changes of the spine and mild lumbar dextroscoliosis. There is advanced L1-2 disc  collapse with peridiscal reactive endplate changes and vacuum phenomenon in the disc space. There is severe acquired foraminal stenosis at this level. There is grade 1 L1-2 discogenic retrolisthesis. There is no visible metastatic bone lesion in the imaged volume. IMPRESSION: 1. 5.8 x 4.7 cm ill-defined hypodense porta hepatis mass with encasement and severe narrowing of the main portal vein, right hepatic portal vein origin, and proximal 1 cm of left hepatic portal vein. The mass is difficult to separate from the caudate hepatic lobe but I suspect is of biliary origin as the common hepatic duct  appears to terminate in the mass. 2. There is moderate to severe intrahepatic biliary dilatation above the mass, and the common bile duct is encased and severely narrowed above the level of the pancreas. 3. The mass encases the main hepatic artery and both proximal left and right hepatic arteries but does not seem to narrow them. 4. There are partially necrotic porta hepatis lymph nodes and a few mildly prominent left para-aortic chain nodes. 5. Severely distended gallbladder with wall thickening and trace pericholecystic fluid, findings consistent with obstructive cholecystitis. 6. 7 x 4.5 x 6.4 cm unilocular thin walled homogeneous fluid collection in the left paracolic gutter just inferior to the tip of the liver, abutting and deforming the mid to distal ascending colon and most likely due to an enteric duplication cyst, retroperitoneal cyst or lymphangioma, but not optimally visualized due to metal spray artifact from nearby spinal fusion hardware. 7. 8 mm right lower lobe nodule, previously 4 mm, with 2 new 2 mm nodules in the anterior aspect of the left upper lobe. 8. Aortic atherosclerosis. Again noted 4.3 cm aneurysmal prominence of the ascending aorta, ectasia in the arch and descending segments with tortuosity. Recommend annual imaging followup by CTA or MRA. This recommendation follows 2010 ACCF/AHA/AATS/ACR/ ASA/SCA/SCAI/SIR/STS/SVM Guidelines for the Diagnosis and Management of Patients with Thoracic Aortic Disease. Circulation. 2010; 121: W295-A213. Aortic aneurysm NOS (ICD10-I71.9). 9. Cardiomegaly with trace calcific CAD. Apparently chronic prominence of the central veins. 10. Chronic dilatation of the pulmonary trunk. Aortic Atherosclerosis (ICD10-I70.0). Electronically Signed   By: Almira Bar M.D.   On: 10/27/2022 21:00    EKG: I have personally reviewed EKG: Sinus rhythm, frequent atrial premature contractions. LVH. No acute changes, no LVH  Assessment/Plan Principal Problem:   Hypodense  mass of liver Active Problems:   Essential hypertension   GERD (gastroesophageal reflux disease)    Assessment and Plan: * Hypodense mass of liver Patient with newly diagnosed hypodense mass at the porta- hepatics with encasement of major vessels, with necrotic lymph nodes. This is highly suspicious for a malignant lesion. MR/MRCP results pending. Dr. Marina Goodell for GI has been consulted and recommended MRCP. Surgical resection unlikely with extent of disease and encasement of vasculature.  Plan  Med-surg admit  NPO until tissue diagnosis obtained  Tissue diagnosis: ERCP with biopsy vs percutaneous needle biopsy by IR.  Oncology consult  For pruritus: hydroxyzine plus H2 blocker   Essential hypertension Patient reports good control with BP 120's/80's on current medical regimen  Plan Continue home medications  GERD (gastroesophageal reflux disease) Patient denies active symptoms and has not been taking medications.   Code status - discussed at length with patient and her daughters. She clearly states that in the event of Cardiac or respiratory arrest DNR/DNI     DVT prophylaxis:  TEDs Code Status: DNR/DNI(Do NOT Intubate) Family Communication: Three daughters present during interview and exam.   Disposition Plan: Home when medically stable  Consults called: GI- Dr. Marina Goodell; will need oncology consult  Admission status: Inpatient, Med-Surg   Illene Regulus, MD [facility] 10/27/2022, 11:58 PM

## 2022-10-27 NOTE — Assessment & Plan Note (Signed)
Patient denies active symptoms and has not been taking medications.

## 2022-10-27 NOTE — Assessment & Plan Note (Addendum)
Patient with newly diagnosed hypodense mass at the porta- hepatics with encasement of major vessels, with necrotic lymph nodes. This is highly suspicious for a malignant lesion. MR/MRCP results pending. Dr. Marina Goodell for GI has been consulted and recommended MRCP. Surgical resection unlikely with extent of disease and encasement of vasculature.  Plan  Med-surg admit  NPO until tissue diagnosis obtained  Tissue diagnosis: ERCP with biopsy vs percutaneous needle biopsy by IR.  Oncology consult  For pruritus: hydroxyzine plus H2 blocker

## 2022-10-27 NOTE — Subjective & Objective (Signed)
Carolyn Fowler is a 77 y.o. with medical h/o HTN, LVH, chronic low back pain reports that over the past 10 days she has been more lethargic and noticed progressive symptoms of symptoms of dark urine, generalized pruritus, eyes and skin yellowing, bowel urgency then pale stools with some near incontinence, loss of appetite, and minor sharp pains in the RUQ mild intermittent without radiation or fever. She denies worsening reflux,other abd pain, dysphagia, n/v, or blood in stool. She has no prior h/o liver disease or pancreatic disease. She presented to [facility] and saw Dr. Jonny Ruiz. She was diagnosed with painless jaundice. Appropriate labs and imaging were ordered revealing abnormal liver functions with elevated total and direct bilirubin, CT revealing ill defined hypdense porta hepatic hypodense mass 5.8 x 4.7 cm wwwwwwwith encasement of hepatic portal veings, hepatic arteries, common bile duct with assoicated cholecystitis. Also revealed were large necrotic porta hepatic lymph nodes. She was instructed to present to [facility] for further evaluation.

## 2022-10-27 NOTE — Telephone Encounter (Signed)
Called and let Pt know

## 2022-10-27 NOTE — Assessment & Plan Note (Signed)
Lab Results  Component Value Date   HGBA1C 5.6 10/25/2022   Stable, pt to continue current medical treatment  - diet, wt control

## 2022-10-27 NOTE — ED Provider Notes (Signed)
[place] [facility] AT [facility] Provider Note   CSN: [id number] Arrival date & time: 10/27/22  1546     History  Chief Complaint  Patient presents with   Liver problem    Carolyn Fowler is a 77 y.o. female history of hypertension, here presenting with weight loss and elevated LFTs.  For the last month or so, patient has been having poor appetite.  She also has been having poor energy.  She has lost about 20 pounds as well.  Family noticed that she has progressively worsening jaundice over the last week or so.  Patient states that she also has been weak and unable to get up.  Patient went to see PCP today and liver enzymes were elevated.  Patient was sent to the ER for further evaluation.  Patient denies any history of malignancy.  In particular denies any history of pancreatic or gastric cancers.  Patient has seen GI for colonoscopy in 2020 and had polyp removed that was benign.  The history is provided by the patient.       Home Medications Prior to Admission medications   Medication Sig Start Date End Date Taking? Authorizing Provider  acetaminophen (TYLENOL) 500 MG tablet Take by mouth. 10/23/14   [provider]  amLODipine (NORVASC) 5 MG tablet Take 1 tablet (5 mg total) by mouth daily. 07/20/22   Wendall Stade, MD  aspirin 81 MG tablet Take 81 mg by mouth daily.    [provider]  carvedilol (COREG) 25 MG tablet Take 1 tablet (25 mg total) by mouth 2 (two) times daily. 08/10/22 11/08/22  Wendall Stade, MD  famotidine (PEPCID) 20 MG tablet Take 1 tablet by mouth twice daily 10/20/18   Myrlene Broker, MD  fluticasone [facility]) 50 MCG/ACT nasal spray Place 2 sprays into both nostrils daily. Patient taking differently: Place 2 sprays into both nostrils as needed. 08/24/18   Myrlene Broker, MD  hydrochlorothiazide (HYDRODIURIL) 25 MG tablet Take 1 tablet (25 mg total) by mouth daily. 05/31/22   Wendall Stade, MD   hydrOXYzine (ATARAX) 10 MG tablet Take 1 tablet (10 mg total) by mouth 3 (three) times daily as needed. 10/25/22   Corwin Levins, MD  Multiple Vitamin (MULTIVITAMIN WITH MINERALS) TABS Take 1 tablet by mouth daily.    [provider]  nystatin-triamcinolone ointment (MYCOLOG) Apply 1 application topically 2 (two) times daily. 12/14/18   Myrlene Broker, MD  Propylene Glycol (SYSTANE COMPLETE OP) Apply to eye.    [provider]  Sennosides (SENOKOT PO) Take by mouth as needed.     [provider]  valsartan (DIOVAN) 320 MG tablet Take 1 tablet (320 mg total) by mouth daily. 05/31/22   Wendall Stade, MD      Allergies    Penicillins, Egg-derived products, and Tetracyclines & related    Review of Systems   Review of Systems  Constitutional:  Positive for fatigue.  Neurological:  Positive for weakness.  All other systems reviewed and are negative.   Physical Exam Updated Vital Signs BP 113/78 (BP Location: Left Arm)   Pulse 75   Temp 99.3 F (37.4 C) (Oral)   Resp 17   Ht 5\' 2"  (1.575 m)   Wt 105.2 kg   SpO2 95%   BMI 42.43 kg/m  Physical Exam Vitals and nursing note reviewed.  Constitutional:      Comments: Chronically ill and tired  HENT:  Head: Normocephalic.     Mouth/Throat:     Mouth: Mucous membranes are dry.  Eyes:     Comments: Obvious scleral icterus  Cardiovascular:     Rate and Rhythm: Normal rate and regular rhythm.     Pulses: Normal pulses.     Heart sounds: Normal heart sounds.  Pulmonary:     Effort: Pulmonary effort is normal.     Breath sounds: Normal breath sounds.  Abdominal:     General: Abdomen is flat.     Comments: Mild epigastric tenderness, no obvious fluid wave   Musculoskeletal:        General: Normal range of motion.     Cervical back: Normal range of motion and neck supple.  Skin:    General: Skin is warm.     Capillary Refill: Capillary refill takes less than 2 seconds.  Neurological:      General: No focal deficit present.     Mental Status: She is alert and oriented to person, place, and time.  Psychiatric:        Mood and Affect: Mood normal.        Behavior: Behavior normal.     ED Results / Procedures / Treatments   Labs (all labs ordered are listed, but only abnormal results are displayed) Labs Reviewed  COMPREHENSIVE METABOLIC PANEL - Abnormal; Notable for the following components:      Result Value   Sodium 132 (*)    Potassium 3.3 (*)    Chloride 94 (*)    Glucose, Bld 116 (*)    Albumin 3.0 (*)    AST 175 (*)    ALT 245 (*)    Alkaline Phosphatase 257 (*)    Total Bilirubin 19.5 (*)    All other components within normal limits  CBC - Abnormal; Notable for the following components:   RBC 5.61 (*)    MCV 71.3 (*)    MCH 23.4 (*)    RDW 17.4 (*)    All other components within normal limits  URINALYSIS, ROUTINE W REFLEX MICROSCOPIC  BILIRUBIN, DIRECT  PROTIME-INR  AMMONIA  LIPASE, BLOOD    EKG None  Radiology No results found.  Procedures Procedures    Medications Ordered in ED Medications  sodium chloride 0.9 % bolus 1,000 mL (has no administration in time range)    ED Course/ Medical Decision Making/ A&P                                 Medical Decision Making Carolyn Fowler is a 77 y.o. female here presenting with painless jaundice.  I am concerned for possible liver versus pancreatic versus gallbladder mass.  Plan to get CBC and CMP and CT chest abdomen pelvis.  9:10 PM I reviewed patient's labs and bilirubin is 20.  Direct bilirubin is 12.8.  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  Also severely distended gallbladder.  I messaged Dr. Marina Goodell from [facility] to arrange for ERCP. I discussed with Dr. Maisie Fus who states that patient will need to get biopsy and oncology evaluation prior to surgical evaluation.  At this point, medicine service to admit for pain control and biopsy   Problems  Addressed: Abdominal aortic aneurysm (AAA) without rupture, unspecified part [facility]): chronic illness or injury Hepatic flexure mass: acute illness or injury Jaundice: acute illness or injury  Amount and/or Complexity of Data Reviewed Labs: ordered.  Decision-making details documented in ED Course. Radiology: ordered and independent interpretation performed. Decision-making details documented in ED Course.  Risk Prescription drug management. Decision regarding hospitalization.    Final Clinical Impression(s) / ED Diagnoses Final diagnoses:  None    Rx / DC Orders ED Discharge Orders     None         Charlynne Pander, MD 10/27/22 2113

## 2022-10-27 NOTE — Telephone Encounter (Signed)
Please to contact pt -   Since liver problem is confirmed, please return to the lab when she can for repeat Liver tests to see if any worsening, as well as Acute Hepatitis panel to check on any viral cause, thanks

## 2022-10-27 NOTE — Telephone Encounter (Signed)
Inbound call from patient's daughter stating patient has been very sick. States patient's recent lab results how live obstruction. States patients eyes are yellow and green and have not been able to eat. Patient is scheduled for 9/20. Patient's daughter is requesting a call back to discuss further. Please advise, thank you.

## 2022-10-27 NOTE — Telephone Encounter (Signed)
Please advise as MD is out of office

## 2022-10-27 NOTE — Assessment & Plan Note (Signed)
BP Readings from Last 3 Encounters:  10/25/22 124/80  09/16/22 134/80  08/10/22 (!) 140/90   Stable, pt to continue medical treatment norvasc 5 every day, coreg 25 bid, hct 25 every day, diovan 320 qd

## 2022-10-27 NOTE — Assessment & Plan Note (Addendum)
New onset with pruritus and minimal pain, o/w relatively asympt but likely hepatic related - for labs today to confirm, CT abd pelvis, and refer GI, also atarax 10 tid prn itching

## 2022-10-27 NOTE — Assessment & Plan Note (Signed)
Stable overall, cont pepcid

## 2022-10-27 NOTE — Assessment & Plan Note (Signed)
Patient reports good control with BP 120's/80's on current medical regimen  Plan Continue home medications

## 2022-10-27 NOTE — Telephone Encounter (Signed)
I spoke with the pt daughter and she tells me that the pt has yellow and green skin and eyes, she is very lethargic, not eating and was told by Dr Jonny Ruiz that she has a liver obstruction.  (See labs)  She says the pt seems to have deteriorated over the past day. I have recommended that the pt go to the ED for eval.  The pt daughter tells me that she totally agrees and was about to head that way.  FYI Dr Russella Dar- you have not seen this pt in 4 years and that was for a colon.

## 2022-10-27 NOTE — ED Triage Notes (Signed)
Pt referred by her GI. Pt has an obstruction in her liver, pt experiencing increased fatigue, weight loss, poor appetite.

## 2022-10-27 NOTE — Assessment & Plan Note (Addendum)
Minor, for CT as above, also for cxr

## 2022-10-28 DIAGNOSIS — Z86010 Personal history of colonic polyps: Secondary | ICD-10-CM

## 2022-10-28 DIAGNOSIS — D7589 Other specified diseases of blood and blood-forming organs: Secondary | ICD-10-CM

## 2022-10-28 DIAGNOSIS — K8021 Calculus of gallbladder without cholecystitis with obstruction: Secondary | ICD-10-CM | POA: Diagnosis not present

## 2022-10-28 DIAGNOSIS — R160 Hepatomegaly, not elsewhere classified: Secondary | ICD-10-CM | POA: Diagnosis not present

## 2022-10-28 LAB — CBC
HCT: 35.7 % — ABNORMAL LOW (ref 36.0–46.0)
Hemoglobin: 11.9 g/dL — ABNORMAL LOW (ref 12.0–15.0)
MCH: 23.6 pg — ABNORMAL LOW (ref 26.0–34.0)
MCHC: 33.3 g/dL (ref 30.0–36.0)
MCV: 70.8 fL — ABNORMAL LOW (ref 80.0–100.0)
Platelets: 215 10*3/uL (ref 150–400)
RBC: 5.04 MIL/uL (ref 3.87–5.11)
RDW: 17.2 % — ABNORMAL HIGH (ref 11.5–15.5)
WBC: 5.7 10*3/uL (ref 4.0–10.5)
nRBC: 0 % (ref 0.0–0.2)

## 2022-10-28 LAB — HEPATITIS PANEL, ACUTE
Hep A IgM: NONREACTIVE
Hep B C IgM: NONREACTIVE
Hepatitis B Surface Ag: NONREACTIVE
Hepatitis C Ab: NONREACTIVE

## 2022-10-28 LAB — CREATININE, SERUM
Creatinine, Ser: 0.47 mg/dL (ref 0.44–1.00)
GFR, Estimated: >60 mL/min (ref >60)

## 2022-10-28 MED ORDER — SODIUM CHLORIDE 0.9 % IV BOLUS
500.0000 mL | Freq: Once | INTRAVENOUS | Status: AC
  Administered 2022-10-28: 500 mL via INTRAVENOUS

## 2022-10-28 MED ORDER — CIPROFLOXACIN IN D5W 400 MG/200ML IV SOLN: 400.0000 mg | INTRAVENOUS | Status: DC

## 2022-10-28 MED ORDER — ENOXAPARIN SODIUM 40 MG/0.4ML IJ SOSY
40.0000 mg | PREFILLED_SYRINGE | Freq: Every evening | INTRAMUSCULAR | Status: DC
  Administered 2022-10-30 – 2022-11-05 (×7): 40 mg via SUBCUTANEOUS
  Filled 2022-10-28 (×7): qty 0.4

## 2022-10-28 NOTE — Plan of Care (Signed)
  Problem: Education: Goal: Knowledge of General Education information will improve Description Including pain rating scale, medication(s)/side effects and non-pharmacologic comfort measures Outcome: Progressing   

## 2022-10-28 NOTE — H&P (View-Only) (Signed)
 Consultation Note   Referring Provider:  [facility] PCP: Myrlene Broker, MD Primary Gastroenterologist: Claudette Head, MD       Reason for Consultation: Obstructive jaundice  DOA: 10/27/2022         Hospital Day: 2   ASSESSMENT & PLAN   77 y.o. year old female with biliary obstruction 2/2 to infiltrative hepatic hilum mass measuring 4.6 x 4.5 x 3.1 cm and involving surrounding vasculature. 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 Metastatic lymphadenopathy is noted in the upper abdomen and retroperitoneum She needs biliary decompression, possibly with ERCP with stent placement. If ERCP not feasible then will need percutaneous drain.    Cholelithiasis. MRI showing distended gallbladder with gallbladder wall thickening and edema. Acute cholecystitis seems in absence of abdominal pain / tenderness.  Chronic microcytosis. Normally hgb is normal around 13. It is 11.9 currently but probably dilutional. --Monitor for now  ? 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   Patient presented to ED yesterday with lethargy, jaundice, dark urine. Recently saw PCP with painless jaundice. CT scan showed liver mass. Patient advised to go to ED. She began having generalized itching about a one ago. Shortly afterwards her urine was orange / brown. Her appetite began diminished. Some nausea without vomiting. Main complaint is fatigue. Lost several pounds over the last month. Hasn't had any abdominal pain  ED / Admission workup notable for :   Alk phos 286 AST 165 ALT 236 Total bilirubin  19.2.9, direct bilirubin 11.2 Sodium 132 Potassium 3.3 WBC normal Hemoglobin 13.1 MCV 71   MRI / MRCP 1. Large  aggressive appearing poorly defined infiltrative mass centered in the hepatic hilum estimated to measure approximately 4.6x 4.5 x 3.1 cm causing central biliary obstruction resulting in severe intrahepatic biliary ductal dilatation, in addition to severe narrowing of numerous vascular structures, most notable for the mainportal vein and proximal left portal vein, in addition to completely encasing the common hepatic artery. This is presumably a cholangiocarcinoma. Metastatic lymphadenopathy is noted in the upper abdomen and retroperitoneum, as above, in addition to 2 tiny lesions in the liver which likely represent intrahepatic metastases. 2. Cholelithiasis with a distended gallbladder with gallbladder wall thickening and edema. These findings may simply be related to intrinsic liver disease, although clinical correlation for signs and symptoms of acute cholecystitis is recommended. 3. 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 4. Additional incidental findings, as above.   Previous GI Evaluations   Oct 2020 surveillance colonoscopy  - One 4 mm polyp in the cecum, removed with a cold biopsy forceps. Resected and retrieved. - Nine 5 to 9 mm polyps in the rectum, in the sigmoid colon, in the descending colon and in the transverse colon, removed with a cold snare. Resected and retrieved. - Moderate diverticulosis in the left colon. - Internal hemorrhoids. - The examination was otherwise normal on direct and retroflexion views.  Surgical [P], colon, cecum, transverse, polyp (6) - INFLAMMATORY  POLYP(S) - HYPERPLASTIC POLYP - NEGATIVE FOR DYSPLASIA  Surgical [P], colon, descending, sigmoid, rectum, polyp (4) - INFLAMMATORY POLYP(S) - NEGATIVE FOR DYSPLASIA    Labs and Imaging: Recent Labs    10/25/22 1613 10/27/22 1641 10/28/22 0130  WBC 7.4 6.3 5.7  HGB 13.7 13.1 11.9*  HCT 43.2 40.0 35.7*  PLT  230.0 241 215   Recent Labs    10/25/22 1613 10/27/22 1641 10/28/22 0130  NA 137 132*  --   K 3.3* 3.3*  --   CL 95* 94*  --   CO2 29 25  --   GLUCOSE 110* 116*  --   BUN 11 10  --   CREATININE 0.68 0.47 0.47  CALCIUM 9.8 9.4  --    Recent Labs    10/27/22 1641 10/27/22 1817  PROT 7.2  --   ALBUMIN 3.0*  --   AST 175*  --   ALT 245*  --   ALKPHOS 257*  --   BILITOT 19.5*  --   BILIDIR  --  12.8*   No results for input(s): "HEPBSAG", "HCVAB", "HEPAIGM", "HEPBIGM" in the last 72 hours. Recent Labs    10/25/22 1613 10/27/22 1817  LABPROT 12.4 14.0  INR 1.2* 1.1      Past Medical History:  Diagnosis Date   Allergy    Anemia    Arthritis    Cataract    forming    Dysrhythmia    occ palpitations- no cardiologist   Exposure to TB    1990's- was treated 2 x then, no s/s    GERD (gastroesophageal reflux disease)    History of keloid of skin    Hypertension    Neuromuscular disorder (HCC)    right leg tumbness and tingling from herniated disc    Past Surgical History:  Procedure Laterality Date   ABDOMINAL HYSTERECTOMY     APPENDECTOMY     BACK SURGERY  Aug 2013   BACK SURGERY  Aug 2016   L3 4 5 S1   COLONOSCOPY     COLONOSCOPY W/ POLYPECTOMY     KELOID EXCISION     on ear   KNEE ARTHROSCOPY     left   KNEE SURGERY     POLYPECTOMY     SHOULDER OPEN ROTATOR CUFF REPAIR     left   TUBAL LIGATION      Family History  Adopted: Yes  Problem Relation Age of Onset   Arthritis Mother    Heart disease Father    Lupus Daughter    Lupus Granddaughter    Lupus Niece     Prior to Admission medications   Medication Sig Start Date End Date Taking? Authorizing Provider  acetaminophen (TYLENOL) 500 MG tablet Take 500 mg by mouth every 6 (six) hours as needed for mild pain or headache. 10/23/14  Yes [provider]  amLODipine (NORVASC) 5 MG tablet Take 1 tablet (5 mg total) by mouth daily. 07/20/22  Yes Wendall Stade, MD  aspirin 81 MG tablet  Take 81 mg by mouth daily.   Yes [provider]  carvedilol (COREG) 25 MG tablet Take 1 tablet (25 mg total) by mouth 2 (two) times daily. 08/10/22 11/08/22 Yes Wendall Stade, MD  famotidine (PEPCID) 20 MG tablet Take 1 tablet by mouth twice daily Patient taking differently: Take 20 mg by mouth 2 (two) times daily. 10/20/18  Yes Myrlene Broker, MD  fluticasone Aleda Grana) 50 MCG/ACT nasal spray Place 2  sprays into both nostrils daily. Patient taking differently: Place 2 sprays into both nostrils daily as needed for allergies or rhinitis. 08/24/18  Yes Myrlene Broker, MD  hydrochlorothiazide (HYDRODIURIL) 25 MG tablet Take 1 tablet (25 mg total) by mouth daily. 05/31/22  Yes Wendall Stade, MD  hydrOXYzine (ATARAX) 10 MG tablet Take 1 tablet (10 mg total) by mouth 3 (three) times daily as needed. Patient taking differently: Take 10 mg by mouth 3 (three) times daily. 10/25/22  Yes Corwin Levins, MD  Multiple Vitamin (MULTIVITAMIN WITH MINERALS) TABS Take 1 tablet by mouth daily.   Yes [provider]  nystatin-triamcinolone ointment (MYCOLOG) Apply 1 application topically 2 (two) times daily. Patient taking differently: Apply 1 application  topically 2 (two) times daily as needed (for irritation- affected areas). 12/14/18  Yes Myrlene Broker, MD  Propylene Glycol (SYSTANE COMPLETE OP) Place 1 drop into both eyes 3 (three) times daily as needed (for irritation).   Yes [provider]  senna (SENOKOT) 8.6 MG TABS tablet Take 1 tablet by mouth daily as needed for mild constipation.   Yes [provider]  valsartan (DIOVAN) 320 MG tablet Take 1 tablet (320 mg total) by mouth daily. 05/31/22  Yes Wendall Stade, MD    Current Facility-Administered Medications  Medication Dose Route Frequency Provider Last Rate Last Admin   acetaminophen (TYLENOL) tablet 650 mg  650 mg Oral Q6H PRN Norins, Rosalyn Gess, MD       Or   acetaminophen (TYLENOL) suppository 650  mg  650 mg Rectal Q6H PRN Norins, Rosalyn Gess, MD       amLODipine (NORVASC) tablet 5 mg  5 mg Oral Daily Norins, Rosalyn Gess, MD       carvedilol (COREG) tablet 25 mg  25 mg Oral BID Jacques Navy, MD   25 mg at 10/27/22 2337   dextrose 5 % and 0.45 % NaCl infusion   Intravenous Continuous Norins, Rosalyn Gess, MD 75 mL/hr at 10/28/22 0024 New Bag at 10/28/22 0024   famotidine (PEPCID) tablet 20 mg  20 mg Oral BID Jacques Navy, MD   20 mg at 10/27/22 2338   hydrochlorothiazide (HYDRODIURIL) tablet 25 mg  25 mg Oral Daily Norins, Rosalyn Gess, MD       hydrOXYzine (ATARAX) tablet 10 mg  10 mg Oral TID Jacques Navy, MD   10 mg at 10/27/22 2338   irbesartan (AVAPRO) tablet 300 mg  300 mg Oral Daily Norins, Rosalyn Gess, MD       senna (SENOKOT) tablet 8.6 mg  1 tablet Oral BID Norins, Rosalyn Gess, MD       traZODone (DESYREL) tablet 25 mg  25 mg Oral QHS PRN Norins, Rosalyn Gess, MD       Current Outpatient Medications  Medication Sig Dispense Refill   acetaminophen (TYLENOL) 500 MG tablet Take 500 mg by mouth every 6 (six) hours as needed for mild pain or headache.     amLODipine (NORVASC) 5 MG tablet Take 1 tablet (5 mg total) by mouth daily. 90 tablet 3   aspirin 81 MG tablet Take 81 mg by mouth daily.     carvedilol (COREG) 25 MG tablet Take 1 tablet (25 mg total) by mouth 2 (two) times daily. 180 tablet 3   famotidine (PEPCID) 20 MG tablet Take 1 tablet by mouth twice daily (Patient taking differently: Take 20 mg by mouth 2 (two) times daily.) 180 tablet 0   fluticasone (FLONASE)  50 MCG/ACT nasal spray Place 2 sprays into both nostrils daily. (Patient taking differently: Place 2 sprays into both nostrils daily as needed for allergies or rhinitis.) 16 g 6   hydrochlorothiazide (HYDRODIURIL) 25 MG tablet Take 1 tablet (25 mg total) by mouth daily. 90 tablet 3   hydrOXYzine (ATARAX) 10 MG tablet Take 1 tablet (10 mg total) by mouth 3 (three) times daily as needed. (Patient taking differently: Take 10  mg by mouth 3 (three) times daily.) 60 tablet 1   Multiple Vitamin (MULTIVITAMIN WITH MINERALS) TABS Take 1 tablet by mouth daily.     nystatin-triamcinolone ointment (MYCOLOG) Apply 1 application topically 2 (two) times daily. (Patient taking differently: Apply 1 application  topically 2 (two) times daily as needed (for irritation- affected areas).) 100 g 3   Propylene Glycol (SYSTANE COMPLETE OP) Place 1 drop into both eyes 3 (three) times daily as needed (for irritation).     senna (SENOKOT) 8.6 MG TABS tablet Take 1 tablet by mouth daily as needed for mild constipation.     valsartan (DIOVAN) 320 MG tablet Take 1 tablet (320 mg total) by mouth daily. 90 tablet 3    Allergies as of 10/27/2022 - Review Complete 10/27/2022  Allergen Reaction Noted   Penicillins Itching, Swelling, Rash, and Other (See Comments) 06/18/2011   Egg-derived products Nausea And Vomiting 04/25/2015   Tetracyclines & related Nausea And Vomiting 10/13/2011    Social History   Socioeconomic History   Marital status: Widowed    Spouse name: Not on file   Number of children: 5   Years of education: 66   Highest education level: Not on file  Occupational History   Occupation: Geologist, engineering    Comment: retired  Tobacco Use   Smoking status: Never   Smokeless tobacco: Never  Vaping Use   Vaping status: Never Used  Substance and Sexual Activity   Alcohol use: No   Drug use: No   Sexual activity: Never  Other Topics Concern   Not on file  Social History Narrative   HS[employer], [employer] BA, Master's degree vocational ed. Married - '66- '79/divorced; married '04   2 sons - '67, '74; 3 daughters - '71, '74, '74; 15 grandchilderns; 3 great-grandchildren. Work - retired from Radiation protection practitioner. Marriage in good health.    Social Determinants of Health   Financial Resource Strain: Low Risk  (01/22/2022)   Overall Financial Resource Strain (CARDIA)    Difficulty of Paying Living Expenses: Not hard at all  Food  Insecurity: No Food Insecurity (01/22/2022)   Hunger Vital Sign    Worried About Running Out of Food in the Last Year: Never true    Ran Out of Food in the Last Year: Never true  Transportation Needs: No Transportation Needs (01/22/2022)   PRAPARE - Administrator, Civil Service (Medical): No    Lack of Transportation (Non-Medical): No  Physical Activity: Inactive (01/22/2022)   Exercise Vital Sign    Days of Exercise per Week: 0 days    Minutes of Exercise per Session: 0 min  Stress: No Stress Concern Present (01/22/2022)   [employer] of Occupational Health - Occupational Stress Questionnaire    Feeling of Stress : Not at all  Social Connections: Moderately Isolated (01/22/2022)   Social Connection and Isolation Panel [NHANES]    Frequency of Communication with Friends and Family: More than three times a week    Frequency of Social Gatherings with Friends and Family: Three times a  week    Attends Religious Services: More than 4 times per year    Active Member of Clubs or Organizations: No    Attends Banker Meetings: Never    Marital Status: Widowed  Intimate Partner Violence: Not At Risk (01/22/2022)   Humiliation, Afraid, Rape, and Kick questionnaire    Fear of Current or Ex-Partner: No    Emotionally Abused: No    Physically Abused: No    Sexually Abused: No     Code Status   Code Status: DNR  Review of Systems: All systems reviewed and negative except where noted in HPI.  Physical Exam: Vital signs in last 24 hours: Temp:  [98.4 F (36.9 C)-99.3 F (37.4 C)] 98.5 F (36.9 C) (08/22 0702) Pulse Rate:  [59-77] 68 (08/22 0900) Resp:  [17-29] 25 (08/22 0900) BP: (101-127)/(56-81) 127/76 (08/22 0900) SpO2:  [92 %-99 %] 92 % (08/22 0900) Weight:  [105.2 kg] 105.2 kg (08/21 1614) Last BM Date : 10/27/22  General:  Pleasant female in NAD Psych:  Cooperative. Normal mood and affect Eyes: Pupils equal Ears:  Normal auditory acuity Nose:  No deformity, discharge or lesions Neck:  Supple, no masses felt Lungs:  Clear to auscultation.  Heart:  Regular rate, regular rhythm.  Abdomen:  Soft, nondistended, nontender, active bowel sounds, no masses felt Rectal :  Deferred Msk: Symmetrical without gross deformities.  Neurologic:  Alert, oriented, grossly normal neurologically Extremities : No edema Skin:  Intact without significant lesions.    Intake/Output from previous day: 08/21 0701 - 08/22 0700 In: 1500 [IV Piggyback:1500] Out: -  Intake/Output this shift:  No intake/output data recorded.  Principal Problem:   Hypodense mass of liver Active Problems:   Essential hypertension   GERD (gastroesophageal reflux disease)    Willette Cluster, NP-C   10/28/2022, 10:08 AM  I have taken an interval history, thoroughly reviewed the chart and examined the patient. I agree with the Advanced Practitioner's note, impression and recommendations, and have recorded additional findings, impressions and recommendations below. I performed a substantive portion of this encounter (>50% time spent), including a complete performance of the medical decision making.  My additional thoughts are as follows:  Recently worsening symptoms of pruritus, fatigue, loss of appetite, weight loss and then worsening icterus and jaundice.  Labs and imaging reveal high-grade biliary obstruction at the hilum most consistent with a cholangiocarcinoma, especially considering the radiographic description of vascular involvement and adjacent adenopathy.  She needs biliary decompression, ideally by ERCP with stent placement.  This can be a technically challenging procedure given the hide grade obstruction in that particular location.  Thankfully, platelets and INR normal.  The plan at this point is an ERCP tomorrow with Dr. Leone Payor.  I described the nature that procedure and how it is performed and she was agreeable.  She will have a further discussion about  risk and benefits with Dr. Leone Payor tomorrow. Patient at increased risk for cardiopulmonary complications of procedure due to medical comorbidities.   They are aware that our first order priority is to decompress the bile duct either endoscopically or percutaneously.  I will have Dr. Leone Payor review the entire case later today and make sure he concurs with the plan for ERCP rather than PTC. They are also aware that if ERCP is performed but unable to successfully pass the tumor for stent placement, then interventional radiology for percutaneous approach and drainage will be necessary shortly afterward.  No clinical signs or symptoms of cholangitis at present,  she should remain on the current ciprofloxacin to decrease the chance of bacteremia during ERCP, especially given the location and nature of the obstructing tumor..  Her daughter was at the bedside for my entire visit, and all their questions were answered.   Charlie Pitter III Office:[phone]  '

## 2022-10-28 NOTE — Progress Notes (Signed)
[facility] Progress Note  Patient: Carolyn Fowler    VFI:[id number]  DOA: 10/27/2022     Date of Service: the patient was seen and examined on 10/28/2022  Chief Complaint  Patient presents with   Liver problem   Brief hospital course: Carolyn Fowler is a 77 y.o. with medical h/o HTN, LVH, chronic low back pain reports that over the past 10 days she has been more lethargic and noticed progressive symptoms of symptoms of dark urine, generalized pruritus, eyes and skin yellowing, bowel urgency then pale stools with some near incontinence, loss of appetite, and minor sharp pains in the RUQ mild intermittent without radiation or fever. She denies worsening reflux,other abd pain, dysphagia, n/v, or blood in stool. She has no prior h/o liver disease or pancreatic disease. She presented to [facility] and saw Dr. Jonny Ruiz. She was diagnosed with painless jaundice. Appropriate labs and imaging were ordered revealing abnormal liver functions with elevated total and direct bilirubin, CT revealing ill defined hypdense porta hepatic hypodense mass 5.8 x 4.7 cm with encasement of hepatic portal veings, hepatic arteries, common bile duct with assoicated cholecystitis. Also revealed were large necrotic porta hepatic lymph nodes. She was instructed to present to [facility] for further evaluation.      ED Course: 98.4  119/81  HR 66  18. Patient in no distress but concerned. Denies discomfort. Lab: K 3.3, Alk phos 257, AST 175 ALT 245, NH3 38 T Bili 19.5, D. Bili 12.8, INR 1.1. CT as per HPI. [facility] called to admit for continued evaluation of hepatic mass and management of medical problems    Assessment and Plan: # Hepatobiliary mass most likely cholangiocarcinoma  Patient with newly diagnosed hypodense mass at the porta- hepatics with encasement of major vessels, with necrotic lymph nodes. This is highly suspicious for a malignant lesion.  MR/MRCP reviewed.  General surgery recommended  GI consult for ERCP. Discussed with GI, recommended IR consult for PTC/PBD Keep n.p.o. after midnight IR for percutaneous transhepatic cholangiogram/percutaneous biliary drain and liver biopsy. Patient will need oncology consult after biopsy report for further management  For pruritus, use hydroxyzine plus H2 blocker    # Essential hypertension Blood pressure is soft Home meds amlodipine, Coreg, HCTZ, irbesartan Continued Coreg today with holding parameters Skip the dose of amlodipine, HCTZ and irbesartan, will resume tomorrow a.m. with holding parameters, Monitor BP and titrate medications accordingly If blood pressure remains soft then DC some medications and decrease the dose of Coreg.    GERD (gastroesophageal reflux disease) Patient denies active symptoms and has not been taking medications. Started Pepcid twice daily   Body mass index is 42.43 kg/m.  Interventions:  Diet: Regular diet DVT Prophylaxis: Subcutaneous Lovenox and SCD before PTC/PBD insertion   Advance goals of care discussion: DNR  Family Communication: family was present at bedside, at the time of interview.  The pt provided permission to discuss medical plan with the family. Opportunity was given to ask question and all questions were answered satisfactorily.   Disposition:  Pt is from Home, admitted with hepatobiliary mass, most likely cholangiocarcinoma, needs PTC/PBD, liver biopsy which needs to be done by IR, patient will need oncology consult, which precludes a safe discharge. Discharge to Home, when stable.  Subjective: No significant events overnight, patient denies any abdominal pain, no chest pain or palpitation, no shortness of breath.  Physical Exam: General: NAD, lying comfortably Appear in no distress, affect appropriate Eyes: PERRLA, icteric sclera ENT: Oral Mucosa  Clear, moist  Neck: no JVD,  Cardiovascular: S1 and S2 Present, no Murmur,  Respiratory: good respiratory effort, Bilateral  Air entry equal and Decreased, no Crackles, no wheezes Abdomen: Bowel Sound present, Soft and no tenderness,  Skin: no rashes, jaundice Extremities: no Pedal edema, no calf tenderness Neurologic: without any new focal findings Gait not checked due to patient safety concerns  Vitals:   10/28/22 0900 10/28/22 1030 10/28/22 1103 10/28/22 1302  BP: 127/76 (!) 107/54  (!) 128/90  Pulse: 68 (!) 59  67  Resp: (!) 25 (!) 25  18  Temp:   97.9 F (36.6 C) 99 F (37.2 C)  TempSrc:   Oral Oral  SpO2: 92% 94%  99%  Weight:      Height:        Intake/Output Summary (Last 24 hours) at 10/28/2022 1449 Last data filed at 10/28/2022 1400 Gross per 24 hour  Intake 1500 ml  Output 0 ml  Net 1500 ml   Filed Weights   10/27/22 1614  Weight: 105.2 kg    Data Reviewed: I have personally reviewed and interpreted daily labs, tele strips, imagings as discussed above. I reviewed all nursing notes, pharmacy notes, vitals, pertinent old records I have discussed plan of care as described above with RN and patient/family.  CBC: Recent Labs  Lab 10/25/22 1613 10/27/22 1641 10/28/22 0130  WBC 7.4 6.3 5.7  NEUTROABS 3.6  --   --   HGB 13.7 13.1 11.9*  HCT 43.2 40.0 35.7*  MCV 73.5* 71.3* 70.8*  PLT 230.0 241 215   Basic Metabolic Panel: Recent Labs  Lab 10/25/22 1613 10/27/22 1641 10/28/22 0130  NA 137 132*  --   K 3.3* 3.3*  --   CL 95* 94*  --   CO2 29 25  --   GLUCOSE 110* 116*  --   BUN 11 10  --   CREATININE 0.68 0.47 0.47  CALCIUM 9.8 9.4  --     Studies: MR ABDOMEN MRCP W WO CONTAST  Result Date: 10/28/2022 CLINICAL DATA:  77 year old female with history of probable mass in the porta hepatis on prior CT examination concerning for malignancy. Follow-up study. EXAM: MRI ABDOMEN WITHOUT AND WITH CONTRAST (INCLUDING MRCP) TECHNIQUE: Multiplanar multisequence MR imaging of the abdomen was performed both before and after the administration of intravenous contrast. Heavily  T2-weighted images of the biliary and pancreatic ducts were obtained, and three-dimensional MRCP images were rendered by post processing. CONTRAST:  10mL GADAVIST GADOBUTROL 1 MMOL/ML IV SOLN COMPARISON:  No prior abdominal MRI. CT of the abdomen and pelvis 10/27/2022. FINDINGS: Lower chest: Unremarkable. Hepatobiliary: In the region of the hepatic hilum (axial image 35 of series 20 and coronal image 38 of series 22 there is a poorly defined infiltrative lesion which is low T1 signal intensity, mildly T2 hyperintense, demonstrates restricted diffusion, and demonstrates heterogeneous enhancement on post gadolinium imaging which appears to increase over time most pronounced on delayed images, highly concerning for probable cholangiocarcinoma. This is estimated to measure approximately 4.6 x 4.5 x 3.1 cm, and encases hilar structures causing severe narrowing of the proximal portal vein which remains patent at this time (best appreciated on axial image 34 of series 20), and complete obliteration of the bile ducts in this region, beyond which the common bile duct is completely decompressed. Severe intrahepatic biliary ductal dilatation is noted. Gallbladder is severely distended. Gallbladder wall appears thickened and edematous with innumerable tiny filling defects in the gallbladder, indicative of gallstones.  Tiny areas of diffusion restriction are noted in the liver in segment 4A (axial image 12 of series 6), and in segment 8 8 (axial image 11 of series 6). The lesion and 4A is not well demonstrated on other pulse sequences, but the lesion in segment 8 is T1 hypointense, T2 hyperintense and appears hypovascular on post gadolinium imaging, concerning for a metastatic lesion. Pancreas: No pancreatic mass. No pancreatic ductal dilatation noted on MRCP images. No peripancreatic fluid collections. Trace amount of T2 signal intensity surrounding the pancreas. Spleen:  Unremarkable. Adrenals/Urinary Tract: Bilateral kidneys  and adrenal glands are normal in appearance. No hydroureteronephrosis in the visualized portions of the abdomen. Stomach/Bowel: In the right upper quadrant of the abdomen inferior to the right lobe of the liver (axial image 27 of series 4 and coronal image 19 of series 3) there is a well-defined 5.4 x 7.0 x 4.6 cm T1 hypointense, T2 hyperintense, nonenhancing lesion which appears intimately associated with the ascending colon, benign in appearance, presumably an enteric duplication cyst. Otherwise, unremarkable. Vascular/Lymphatic: No aneurysm identified in the visualized abdominal vasculature. Severe narrowing of the portal vein and proximal left main portal vein secondary to encasement from the previously described mass in the porta hepatis. This mass also comes in close proximity to the inferior vena cava causing narrowing of the left renal vein which appears distended proximally. Common hepatic artery is encased by the mass. Numerous enlarged lymph nodes are noted in the upper abdomen measuring up to 1.2 cm in the gastrohepatic ligament (axial image 27 of series 20), notable for diffusion restriction, likely metastatic. Enlarged heterogeneously enhancing and diffusion restricting left para-aortic lymph node immediately beneath the left renal hilum (axial image 51 of series 20) also concerning for metastatic lymph node. Other: No significant volume of ascites noted in the visualized portions of the peritoneal cavity. Musculoskeletal: Extensive susceptibility artifact noted throughout the lumbar spine related to indwelling spinal hardware. IMPRESSION: 1. Large aggressive appearing poorly defined infiltrative mass centered in the hepatic hilum estimated to measure approximately 4.6 x 4.5 x 3.1 cm causing central biliary obstruction resulting in severe intrahepatic biliary ductal dilatation, in addition to severe narrowing of numerous vascular structures, most notable for the main portal vein and proximal left  portal vein, in addition to completely encasing the common hepatic artery. This is presumably a cholangiocarcinoma. Metastatic lymphadenopathy is noted in the upper abdomen and retroperitoneum, as above, in addition to 2 tiny lesions in the liver which likely represent intrahepatic metastases. 2. Cholelithiasis with a distended gallbladder with gallbladder wall thickening and edema. These findings may simply be related to intrinsic liver disease, although clinical correlation for signs and symptoms of acute cholecystitis is recommended. 3. 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 4. Additional incidental findings, as above. Electronically Signed   By: Trudie Reed M.D.   On: 10/28/2022 06:25   MR 3D Recon At Scanner  Result Date: 10/28/2022 CLINICAL DATA:  77 year old female with history of probable mass in the porta hepatis on prior CT examination concerning for malignancy. Follow-up study. EXAM: MRI ABDOMEN WITHOUT AND WITH CONTRAST (INCLUDING MRCP) TECHNIQUE: Multiplanar multisequence MR imaging of the abdomen was performed both before and after the administration of intravenous contrast. Heavily T2-weighted images of the biliary and pancreatic ducts were obtained, and three-dimensional MRCP images were rendered by post processing. CONTRAST:  10mL GADAVIST GADOBUTROL 1 MMOL/ML IV SOLN COMPARISON:  No prior abdominal MRI. CT  of the abdomen and pelvis 10/27/2022. FINDINGS: Lower chest: Unremarkable. Hepatobiliary: In the region of the hepatic hilum (axial image 35 of series 20 and coronal image 38 of series 22 there is a poorly defined infiltrative lesion which is low T1 signal intensity, mildly T2 hyperintense, demonstrates restricted diffusion, and demonstrates heterogeneous enhancement on post gadolinium imaging which appears to increase over time most pronounced on delayed images, highly concerning for probable  cholangiocarcinoma. This is estimated to measure approximately 4.6 x 4.5 x 3.1 cm, and encases hilar structures causing severe narrowing of the proximal portal vein which remains patent at this time (best appreciated on axial image 34 of series 20), and complete obliteration of the bile ducts in this region, beyond which the common bile duct is completely decompressed. Severe intrahepatic biliary ductal dilatation is noted. Gallbladder is severely distended. Gallbladder wall appears thickened and edematous with innumerable tiny filling defects in the gallbladder, indicative of gallstones. Tiny areas of diffusion restriction are noted in the liver in segment 4A (axial image 12 of series 6), and in segment 8 8 (axial image 11 of series 6). The lesion and 4A is not well demonstrated on other pulse sequences, but the lesion in segment 8 is T1 hypointense, T2 hyperintense and appears hypovascular on post gadolinium imaging, concerning for a metastatic lesion. Pancreas: No pancreatic mass. No pancreatic ductal dilatation noted on MRCP images. No peripancreatic fluid collections. Trace amount of T2 signal intensity surrounding the pancreas. Spleen:  Unremarkable. Adrenals/Urinary Tract: Bilateral kidneys and adrenal glands are normal in appearance. No hydroureteronephrosis in the visualized portions of the abdomen. Stomach/Bowel: In the right upper quadrant of the abdomen inferior to the right lobe of the liver (axial image 27 of series 4 and coronal image 19 of series 3) there is a well-defined 5.4 x 7.0 x 4.6 cm T1 hypointense, T2 hyperintense, nonenhancing lesion which appears intimately associated with the ascending colon, benign in appearance, presumably an enteric duplication cyst. Otherwise, unremarkable. Vascular/Lymphatic: No aneurysm identified in the visualized abdominal vasculature. Severe narrowing of the portal vein and proximal left main portal vein secondary to encasement from the previously described mass  in the porta hepatis. This mass also comes in close proximity to the inferior vena cava causing narrowing of the left renal vein which appears distended proximally. Common hepatic artery is encased by the mass. Numerous enlarged lymph nodes are noted in the upper abdomen measuring up to 1.2 cm in the gastrohepatic ligament (axial image 27 of series 20), notable for diffusion restriction, likely metastatic. Enlarged heterogeneously enhancing and diffusion restricting left para-aortic lymph node immediately beneath the left renal hilum (axial image 51 of series 20) also concerning for metastatic lymph node. Other: No significant volume of ascites noted in the visualized portions of the peritoneal cavity. Musculoskeletal: Extensive susceptibility artifact noted throughout the lumbar spine related to indwelling spinal hardware. IMPRESSION: 1. Large aggressive appearing poorly defined infiltrative mass centered in the hepatic hilum estimated to measure approximately 4.6 x 4.5 x 3.1 cm causing central biliary obstruction resulting in severe intrahepatic biliary ductal dilatation, in addition to severe narrowing of numerous vascular structures, most notable for the main portal vein and proximal left portal vein, in addition to completely encasing the common hepatic artery. This is presumably a cholangiocarcinoma. Metastatic lymphadenopathy is noted in the upper abdomen and retroperitoneum, as above, in addition to 2 tiny lesions in the liver which likely represent intrahepatic metastases. 2. Cholelithiasis with a distended gallbladder with gallbladder wall thickening and edema. These  findings may simply be related to intrinsic liver disease, although clinical correlation for signs and symptoms of acute cholecystitis is recommended. 3. 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 4. Additional incidental findings, as above.  Electronically Signed   By: Trudie Reed M.D.   On: 10/28/2022 06:25   CT CHEST ABDOMEN PELVIS W CONTRAST  Result Date: 10/27/2022 CLINICAL DATA:  Metastatic disease evaluation. Patient with hepatic obstruction. Increasing fatigue, weight loss and poor appetite. EXAM: CT CHEST, ABDOMEN, AND PELVIS WITH CONTRAST TECHNIQUE: Multidetector CT imaging of the chest, abdomen and pelvis was performed following the standard protocol during bolus administration of intravenous contrast. RADIATION DOSE REDUCTION: This exam was performed according to the departmental dose-optimization program which includes automated exposure control, adjustment of the mA and/or kV according to patient size and/or use of iterative reconstruction technique. CONTRAST:  OMNIPAQUE IOHEXOL 300 MG/ML  SOLN COMPARISON:  PA Lat chest 10/25/2022, chest CT no contrast 03/31/2022. No prior abdomen pelvis CT. FINDINGS: CT CHEST FINDINGS Cardiovascular: There is mild cardiomegaly with heavy calcification in the inferolateral mitral ring. There is no pericardial effusion. Minimal two-vessel coronary calcifications LAD and circumflex. Pulmonary trunk dilatation 4.4 cm is again noted indicating arterial hypertension. No central embolus is seen. Mild prominence of the central pulmonary veins is again noted but unchanged. There is chronic tortuosity and dilatation of the aorta, tortuous and ectatic great vessels exaggerating the superior mediastinum. There are scattered calcifications in the arch and descending aorta. The aortic root is 3.9 cm at the sinuses of Valsalva and again measures 4.3 cm in the ascending segment, 3.8 cm in the arch and descending segments. Mediastinum/Nodes: There are a few bilateral subcentimeter hypodense nodules in the lower poles of the thyroid gland but no nodules requiring imaging follow-up. Axillary spaces are clear. There is no intrathoracic adenopathy. Small hiatal hernia with unremarkable thoracic esophagus,  thoracic trachea. Both main bronchi are clear. Lungs/Pleura: Mild chronic elevation right hemidiaphragm. There is left lower lobe medial basal linear scarring associated with mild bronchiectasis. Additional scattered scar-like opacities both bases are again noted and mild subpleural reticulation in the right middle and lower lobe bases. There is a calcified left lower lobe granuloma. There is an 8 mm pleural-based right lower lobe nodule laterally on 6:72 which was previously 4 mm, with linear scarring or atelectasis again extending medially from the nodule towards the hilum. There is a stable 3 mm subpleural nodule in the right middle lobe laterally on 6:74. Not seen previously, 2 nodules each measuring 2 mm are noted in the anterior aspect of the left upper lobe on 6:45 and 47. There is a 2 mm right upper lobe nodule laterally on 6:42 which was seen previously, unchanged. No other new nodules are seen. There are no active infiltrates, no pleural effusion or pneumothorax. Musculoskeletal: There is osteopenia, lower thoracic spine bridging enthesopathy and multilevel degenerative thoracic discs with mild kyphodextroscoliosis. No aggressive regional bone lesion is seen. There are dystrophic calcifications in the anterior left chest wall with no visible chest wall mass. CT ABDOMEN PELVIS FINDINGS Hepatobiliary: Not seen on 03/31/2022 there is an ill-defined hypodense porta hepatis mass, on 2:49 estimated to measure as much as 5.8 x 4.7 cm. The mass encases and severely narrows the hepatic portal main vein, the right hepatic portal vein origin, and the proximal 1 cm of left hepatic portal vein. Faint contrast is still seen in these vessels distal to  the narrowing. The mass encases the main hepatic artery and both the proximal left and right hepatic arteries but does not seem to narrow them. The mass is difficult to separate from the overlying caudate hepatic lobe but I suspect is of biliary origin as the common hepatic  duct appears to terminate in the mass. There is moderate to severe intrahepatic biliary dilatation above this. The common bile duct is encased and severely narrowed above the level of the pancreas. Except where the caudate hepatic lobe abuts and is difficult to separate from the underlying mass, the rest of the liver enhances homogeneously without a visible mass. The gallbladder is dilated and thick-walled, distended up to 12 cm in length. This is probably due to obstructive cholecystitis. The cystic duct is not seen. There is trace pericholecystic fluid. Pancreas: There are enlarged periportal lymph nodes abutting the anterior and posterior head of the pancreas, with the pancreas itself enhances homogeneously without a visible mass and no ductal dilatation or inflammatory change. Spleen: No abnormality.  No splenomegaly. Adrenals/Urinary Tract: Chronic slight nodular thickening both adrenal glands, was also noted on the CTA chest from 09/13/2009 and unchanged. The kidneys enhance homogeneously except for a 1.3 cm homogeneous cyst in the superior pole of the left kidney, Hounsfield density is -2. No follow-up imaging is needed. There is no urinary stone or obstruction. The bladder unremarkable for the degree of distention. Stomach/Bowel: No dilatation or wall thickening. Uncomplicated sigmoid diverticulosis. Vascular/Lymphatic: There is patchy aortoiliac atherosclerosis without AAA. The iliac arteries are tortuous but clear. There are partially necrotic porta hepatis lymph nodes, 1 noted abutting the anterior head of pancreas on 2:50 measuring 3.3 x 1.7 cm, and another abutting the posterior head of pancreas measuring 2.7 x 1.6 cm on the same image. There are few mildly prominent left para-aortic chain nodes up to 1.3 cm in short axis. No pelvic or mesenteric adenopathy. Reproductive: Status post hysterectomy. No adnexal masses. Other: In the left paracolic gutter just inferior to the tip of the liver, there is a  unilocular thin walled homogeneous ovoid simple appearing fluid collection measuring 7 x 4.5 x 6.4 cm, Hounsfield density is 23. This abuts and deforms the lateral wall of the mid to distal ascending colon. 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 There are no other fluid collections. There is no free ascites, free air, or free hemorrhage, and no incarcerated hernia. Musculoskeletal: There is lumbar spinal fusion hardware L3-S1, osteopenia, degenerative changes of the spine and mild lumbar dextroscoliosis. There is advanced L1-2 disc collapse with peridiscal reactive endplate changes and vacuum phenomenon in the disc space. There is severe acquired foraminal stenosis at this level. There is grade 1 L1-2 discogenic retrolisthesis. There is no visible metastatic bone lesion in the imaged volume. IMPRESSION: 1. 5.8 x 4.7 cm ill-defined hypodense porta hepatis mass with encasement and severe narrowing of the main portal vein, right hepatic portal vein origin, and proximal 1 cm of left hepatic portal vein. The mass is difficult to separate from the caudate hepatic lobe but I suspect is of biliary origin as the common hepatic duct appears to terminate in the mass. 2. There is moderate to severe intrahepatic biliary dilatation above the mass, and the common bile duct is encased and severely narrowed above the level of the pancreas. 3. The mass encases the main hepatic  artery and both proximal left and right hepatic arteries but does not seem to narrow them. 4. There are partially necrotic porta hepatis lymph nodes and a few mildly prominent left para-aortic chain nodes. 5. Severely distended gallbladder with wall thickening and trace pericholecystic fluid, findings consistent with obstructive cholecystitis. 6. 7 x 4.5 x 6.4 cm unilocular  thin walled homogeneous fluid collection in the left paracolic gutter just inferior to the tip of the liver, abutting and deforming the mid to distal ascending colon and most likely due to an enteric duplication cyst, retroperitoneal cyst or lymphangioma, but not optimally visualized due to metal spray artifact from nearby spinal fusion hardware. 7. 8 mm right lower lobe nodule, previously 4 mm, with 2 new 2 mm nodules in the anterior aspect of the left upper lobe. 8. Aortic atherosclerosis. Again noted 4.3 cm aneurysmal prominence of the ascending aorta, ectasia in the arch and descending segments with tortuosity. Recommend annual imaging followup by CTA or MRA. This recommendation follows 2010 ACCF/AHA/AATS/ACR/ ASA/SCA/SCAI/SIR/STS/SVM Guidelines for the Diagnosis and Management of Patients with Thoracic Aortic Disease. Circulation. 2010; 121: Z610-R604. Aortic aneurysm NOS (ICD10-I71.9). 9. Cardiomegaly with trace calcific CAD. Apparently chronic prominence of the central veins. 10. Chronic dilatation of the pulmonary trunk. Aortic Atherosclerosis (ICD10-I70.0). Electronically Signed   By: Almira Bar M.D.   On: 10/27/2022 21:00    Scheduled Meds:  amLODipine  5 mg Oral Daily   carvedilol  25 mg Oral BID   famotidine  20 mg Oral BID   hydrochlorothiazide  25 mg Oral Daily   hydrOXYzine  10 mg Oral TID   irbesartan  300 mg Oral Daily   senna  1 tablet Oral BID   Continuous Infusions:  [START ON 10/29/2022] ciprofloxacin     dextrose 5 % and 0.45 % NaCl 75 mL/hr at 10/28/22 0024   PRN Meds: acetaminophen **OR** acetaminophen, traZODone  Time spent: 55 minutes  Author: Gillis Santa. MD [facility] 10/28/2022 2:49 PM  To reach On-call, see care teams to locate the attending and reach out to them via [email or web address]. If 7PM-7AM, please contact night-coverage If you still have difficulty reaching the attending provider, please page the [facility]) (Director on Call) for [facility] on  amion for assistance.

## 2022-10-28 NOTE — Consult Note (Addendum)
Consultation Note   Referring Provider:  [facility] PCP: Myrlene Broker, MD Primary Gastroenterologist: Claudette Head, MD       Reason for Consultation: Obstructive jaundice  DOA: 10/27/2022         Hospital Day: 2   ASSESSMENT & PLAN   77 y.o. year old female with biliary obstruction 2/2 to infiltrative hepatic hilum mass measuring 4.6 x 4.5 x 3.1 cm and involving surrounding vasculature. 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 Metastatic lymphadenopathy is noted in the upper abdomen and retroperitoneum She needs biliary decompression, possibly with ERCP with stent placement. If ERCP not feasible then will need percutaneous drain.    Cholelithiasis. MRI showing distended gallbladder with gallbladder wall thickening and edema. Acute cholecystitis seems in absence of abdominal pain / tenderness.  Chronic microcytosis. Normally hgb is normal around 13. It is 11.9 currently but probably dilutional. --Monitor for now  ? 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   Patient presented to ED yesterday with lethargy, jaundice, dark urine. Recently saw PCP with painless jaundice. CT scan showed liver mass. Patient advised to go to ED. She began having generalized itching about a one ago. Shortly afterwards her urine was orange / brown. Her appetite began diminished. Some nausea without vomiting. Main complaint is fatigue. Lost several pounds over the last month. Hasn't had any abdominal pain  ED / Admission workup notable for :   Alk phos 286 AST 165 ALT 236 Total bilirubin  19.2.9, direct bilirubin 11.2 Sodium 132 Potassium 3.3 WBC normal Hemoglobin 13.1 MCV 71   MRI / MRCP 1. Large  aggressive appearing poorly defined infiltrative mass centered in the hepatic hilum estimated to measure approximately 4.6x 4.5 x 3.1 cm causing central biliary obstruction resulting in severe intrahepatic biliary ductal dilatation, in addition to severe narrowing of numerous vascular structures, most notable for the mainportal vein and proximal left portal vein, in addition to completely encasing the common hepatic artery. This is presumably a cholangiocarcinoma. Metastatic lymphadenopathy is noted in the upper abdomen and retroperitoneum, as above, in addition to 2 tiny lesions in the liver which likely represent intrahepatic metastases. 2. Cholelithiasis with a distended gallbladder with gallbladder wall thickening and edema. These findings may simply be related to intrinsic liver disease, although clinical correlation for signs and symptoms of acute cholecystitis is recommended. 3. 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 4. Additional incidental findings, as above.   Previous GI Evaluations   Oct 2020 surveillance colonoscopy  - One 4 mm polyp in the cecum, removed with a cold biopsy forceps. Resected and retrieved. - Nine 5 to 9 mm polyps in the rectum, in the sigmoid colon, in the descending colon and in the transverse colon, removed with a cold snare. Resected and retrieved. - Moderate diverticulosis in the left colon. - Internal hemorrhoids. - The examination was otherwise normal on direct and retroflexion views.  Surgical [P], colon, cecum, transverse, polyp (6) - INFLAMMATORY  POLYP(S) - HYPERPLASTIC POLYP - NEGATIVE FOR DYSPLASIA  Surgical [P], colon, descending, sigmoid, rectum, polyp (4) - INFLAMMATORY POLYP(S) - NEGATIVE FOR DYSPLASIA    Labs and Imaging: Recent Labs    10/25/22 1613 10/27/22 1641 10/28/22 0130  WBC 7.4 6.3 5.7  HGB 13.7 13.1 11.9*  HCT 43.2 40.0 35.7*  PLT  230.0 241 215   Recent Labs    10/25/22 1613 10/27/22 1641 10/28/22 0130  NA 137 132*  --   K 3.3* 3.3*  --   CL 95* 94*  --   CO2 29 25  --   GLUCOSE 110* 116*  --   BUN 11 10  --   CREATININE 0.68 0.47 0.47  CALCIUM 9.8 9.4  --    Recent Labs    10/27/22 1641 10/27/22 1817  PROT 7.2  --   ALBUMIN 3.0*  --   AST 175*  --   ALT 245*  --   ALKPHOS 257*  --   BILITOT 19.5*  --   BILIDIR  --  12.8*   No results for input(s): "HEPBSAG", "HCVAB", "HEPAIGM", "HEPBIGM" in the last 72 hours. Recent Labs    10/25/22 1613 10/27/22 1817  LABPROT 12.4 14.0  INR 1.2* 1.1      Past Medical History:  Diagnosis Date   Allergy    Anemia    Arthritis    Cataract    forming    Dysrhythmia    occ palpitations- no cardiologist   Exposure to TB    1990's- was treated 2 x then, no s/s    GERD (gastroesophageal reflux disease)    History of keloid of skin    Hypertension    Neuromuscular disorder (HCC)    right leg tumbness and tingling from herniated disc    Past Surgical History:  Procedure Laterality Date   ABDOMINAL HYSTERECTOMY     APPENDECTOMY     BACK SURGERY  Aug 2013   BACK SURGERY  Aug 2016   L3 4 5 S1   COLONOSCOPY     COLONOSCOPY W/ POLYPECTOMY     KELOID EXCISION     on ear   KNEE ARTHROSCOPY     left   KNEE SURGERY     POLYPECTOMY     SHOULDER OPEN ROTATOR CUFF REPAIR     left   TUBAL LIGATION      Family History  Adopted: Yes  Problem Relation Age of Onset   Arthritis Mother    Heart disease Father    Lupus Daughter    Lupus Granddaughter    Lupus Niece     Prior to Admission medications   Medication Sig Start Date End Date Taking? Authorizing Provider  acetaminophen (TYLENOL) 500 MG tablet Take 500 mg by mouth every 6 (six) hours as needed for mild pain or headache. 10/23/14  Yes [provider]  amLODipine (NORVASC) 5 MG tablet Take 1 tablet (5 mg total) by mouth daily. 07/20/22  Yes Wendall Stade, MD  aspirin 81 MG tablet  Take 81 mg by mouth daily.   Yes [provider]  carvedilol (COREG) 25 MG tablet Take 1 tablet (25 mg total) by mouth 2 (two) times daily. 08/10/22 11/08/22 Yes Wendall Stade, MD  famotidine (PEPCID) 20 MG tablet Take 1 tablet by mouth twice daily Patient taking differently: Take 20 mg by mouth 2 (two) times daily. 10/20/18  Yes Myrlene Broker, MD  fluticasone Aleda Grana) 50 MCG/ACT nasal spray Place 2  sprays into both nostrils daily. Patient taking differently: Place 2 sprays into both nostrils daily as needed for allergies or rhinitis. 08/24/18  Yes Myrlene Broker, MD  hydrochlorothiazide (HYDRODIURIL) 25 MG tablet Take 1 tablet (25 mg total) by mouth daily. 05/31/22  Yes Wendall Stade, MD  hydrOXYzine (ATARAX) 10 MG tablet Take 1 tablet (10 mg total) by mouth 3 (three) times daily as needed. Patient taking differently: Take 10 mg by mouth 3 (three) times daily. 10/25/22  Yes Corwin Levins, MD  Multiple Vitamin (MULTIVITAMIN WITH MINERALS) TABS Take 1 tablet by mouth daily.   Yes [provider]  nystatin-triamcinolone ointment (MYCOLOG) Apply 1 application topically 2 (two) times daily. Patient taking differently: Apply 1 application  topically 2 (two) times daily as needed (for irritation- affected areas). 12/14/18  Yes Myrlene Broker, MD  Propylene Glycol (SYSTANE COMPLETE OP) Place 1 drop into both eyes 3 (three) times daily as needed (for irritation).   Yes [provider]  senna (SENOKOT) 8.6 MG TABS tablet Take 1 tablet by mouth daily as needed for mild constipation.   Yes [provider]  valsartan (DIOVAN) 320 MG tablet Take 1 tablet (320 mg total) by mouth daily. 05/31/22  Yes Wendall Stade, MD    Current Facility-Administered Medications  Medication Dose Route Frequency Provider Last Rate Last Admin   acetaminophen (TYLENOL) tablet 650 mg  650 mg Oral Q6H PRN Norins, Rosalyn Gess, MD       Or   acetaminophen (TYLENOL) suppository 650  mg  650 mg Rectal Q6H PRN Norins, Rosalyn Gess, MD       amLODipine (NORVASC) tablet 5 mg  5 mg Oral Daily Norins, Rosalyn Gess, MD       carvedilol (COREG) tablet 25 mg  25 mg Oral BID Jacques Navy, MD   25 mg at 10/27/22 2337   dextrose 5 % and 0.45 % NaCl infusion   Intravenous Continuous Norins, Rosalyn Gess, MD 75 mL/hr at 10/28/22 0024 New Bag at 10/28/22 0024   famotidine (PEPCID) tablet 20 mg  20 mg Oral BID Jacques Navy, MD   20 mg at 10/27/22 2338   hydrochlorothiazide (HYDRODIURIL) tablet 25 mg  25 mg Oral Daily Norins, Rosalyn Gess, MD       hydrOXYzine (ATARAX) tablet 10 mg  10 mg Oral TID Jacques Navy, MD   10 mg at 10/27/22 2338   irbesartan (AVAPRO) tablet 300 mg  300 mg Oral Daily Norins, Rosalyn Gess, MD       senna (SENOKOT) tablet 8.6 mg  1 tablet Oral BID Norins, Rosalyn Gess, MD       traZODone (DESYREL) tablet 25 mg  25 mg Oral QHS PRN Norins, Rosalyn Gess, MD       Current Outpatient Medications  Medication Sig Dispense Refill   acetaminophen (TYLENOL) 500 MG tablet Take 500 mg by mouth every 6 (six) hours as needed for mild pain or headache.     amLODipine (NORVASC) 5 MG tablet Take 1 tablet (5 mg total) by mouth daily. 90 tablet 3   aspirin 81 MG tablet Take 81 mg by mouth daily.     carvedilol (COREG) 25 MG tablet Take 1 tablet (25 mg total) by mouth 2 (two) times daily. 180 tablet 3   famotidine (PEPCID) 20 MG tablet Take 1 tablet by mouth twice daily (Patient taking differently: Take 20 mg by mouth 2 (two) times daily.) 180 tablet 0   fluticasone (FLONASE)  50 MCG/ACT nasal spray Place 2 sprays into both nostrils daily. (Patient taking differently: Place 2 sprays into both nostrils daily as needed for allergies or rhinitis.) 16 g 6   hydrochlorothiazide (HYDRODIURIL) 25 MG tablet Take 1 tablet (25 mg total) by mouth daily. 90 tablet 3   hydrOXYzine (ATARAX) 10 MG tablet Take 1 tablet (10 mg total) by mouth 3 (three) times daily as needed. (Patient taking differently: Take 10  mg by mouth 3 (three) times daily.) 60 tablet 1   Multiple Vitamin (MULTIVITAMIN WITH MINERALS) TABS Take 1 tablet by mouth daily.     nystatin-triamcinolone ointment (MYCOLOG) Apply 1 application topically 2 (two) times daily. (Patient taking differently: Apply 1 application  topically 2 (two) times daily as needed (for irritation- affected areas).) 100 g 3   Propylene Glycol (SYSTANE COMPLETE OP) Place 1 drop into both eyes 3 (three) times daily as needed (for irritation).     senna (SENOKOT) 8.6 MG TABS tablet Take 1 tablet by mouth daily as needed for mild constipation.     valsartan (DIOVAN) 320 MG tablet Take 1 tablet (320 mg total) by mouth daily. 90 tablet 3    Allergies as of 10/27/2022 - Review Complete 10/27/2022  Allergen Reaction Noted   Penicillins Itching, Swelling, Rash, and Other (See Comments) 06/18/2011   Egg-derived products Nausea And Vomiting 04/25/2015   Tetracyclines & related Nausea And Vomiting 10/13/2011    Social History   Socioeconomic History   Marital status: Widowed    Spouse name: Not on file   Number of children: 5   Years of education: 66   Highest education level: Not on file  Occupational History   Occupation: Geologist, engineering    Comment: retired  Tobacco Use   Smoking status: Never   Smokeless tobacco: Never  Vaping Use   Vaping status: Never Used  Substance and Sexual Activity   Alcohol use: No   Drug use: No   Sexual activity: Never  Other Topics Concern   Not on file  Social History Narrative   HS[employer], [employer] BA, Master's degree vocational ed. Married - '66- '79/divorced; married '04   2 sons - '67, '74; 3 daughters - '71, '74, '74; 15 grandchilderns; 3 great-grandchildren. Work - retired from Radiation protection practitioner. Marriage in good health.    Social Determinants of Health   Financial Resource Strain: Low Risk  (01/22/2022)   Overall Financial Resource Strain (CARDIA)    Difficulty of Paying Living Expenses: Not hard at all  Food  Insecurity: No Food Insecurity (01/22/2022)   Hunger Vital Sign    Worried About Running Out of Food in the Last Year: Never true    Ran Out of Food in the Last Year: Never true  Transportation Needs: No Transportation Needs (01/22/2022)   PRAPARE - Administrator, Civil Service (Medical): No    Lack of Transportation (Non-Medical): No  Physical Activity: Inactive (01/22/2022)   Exercise Vital Sign    Days of Exercise per Week: 0 days    Minutes of Exercise per Session: 0 min  Stress: No Stress Concern Present (01/22/2022)   [employer] of Occupational Health - Occupational Stress Questionnaire    Feeling of Stress : Not at all  Social Connections: Moderately Isolated (01/22/2022)   Social Connection and Isolation Panel [NHANES]    Frequency of Communication with Friends and Family: More than three times a week    Frequency of Social Gatherings with Friends and Family: Three times a  week    Attends Religious Services: More than 4 times per year    Active Member of Clubs or Organizations: No    Attends Banker Meetings: Never    Marital Status: Widowed  Intimate Partner Violence: Not At Risk (01/22/2022)   Humiliation, Afraid, Rape, and Kick questionnaire    Fear of Current or Ex-Partner: No    Emotionally Abused: No    Physically Abused: No    Sexually Abused: No     Code Status   Code Status: DNR  Review of Systems: All systems reviewed and negative except where noted in HPI.  Physical Exam: Vital signs in last 24 hours: Temp:  [98.4 F (36.9 C)-99.3 F (37.4 C)] 98.5 F (36.9 C) (08/22 0702) Pulse Rate:  [59-77] 68 (08/22 0900) Resp:  [17-29] 25 (08/22 0900) BP: (101-127)/(56-81) 127/76 (08/22 0900) SpO2:  [92 %-99 %] 92 % (08/22 0900) Weight:  [105.2 kg] 105.2 kg (08/21 1614) Last BM Date : 10/27/22  General:  Pleasant female in NAD Psych:  Cooperative. Normal mood and affect Eyes: Pupils equal Ears:  Normal auditory acuity Nose:  No deformity, discharge or lesions Neck:  Supple, no masses felt Lungs:  Clear to auscultation.  Heart:  Regular rate, regular rhythm.  Abdomen:  Soft, nondistended, nontender, active bowel sounds, no masses felt Rectal :  Deferred Msk: Symmetrical without gross deformities.  Neurologic:  Alert, oriented, grossly normal neurologically Extremities : No edema Skin:  Intact without significant lesions.    Intake/Output from previous day: 08/21 0701 - 08/22 0700 In: 1500 [IV Piggyback:1500] Out: -  Intake/Output this shift:  No intake/output data recorded.  Principal Problem:   Hypodense mass of liver Active Problems:   Essential hypertension   GERD (gastroesophageal reflux disease)    Willette Cluster, NP-C   10/28/2022, 10:08 AM  I have taken an interval history, thoroughly reviewed the chart and examined the patient. I agree with the Advanced Practitioner's note, impression and recommendations, and have recorded additional findings, impressions and recommendations below. I performed a substantive portion of this encounter (>50% time spent), including a complete performance of the medical decision making.  My additional thoughts are as follows:  Recently worsening symptoms of pruritus, fatigue, loss of appetite, weight loss and then worsening icterus and jaundice.  Labs and imaging reveal high-grade biliary obstruction at the hilum most consistent with a cholangiocarcinoma, especially considering the radiographic description of vascular involvement and adjacent adenopathy.  She needs biliary decompression, ideally by ERCP with stent placement.  This can be a technically challenging procedure given the hide grade obstruction in that particular location.  Thankfully, platelets and INR normal.  The plan at this point is an ERCP tomorrow with Dr. Leone Payor.  I described the nature that procedure and how it is performed and she was agreeable.  She will have a further discussion about  risk and benefits with Dr. Leone Payor tomorrow. Patient at increased risk for cardiopulmonary complications of procedure due to medical comorbidities.   They are aware that our first order priority is to decompress the bile duct either endoscopically or percutaneously.  I will have Dr. Leone Payor review the entire case later today and make sure he concurs with the plan for ERCP rather than PTC. They are also aware that if ERCP is performed but unable to successfully pass the tumor for stent placement, then interventional radiology for percutaneous approach and drainage will be necessary shortly afterward.  No clinical signs or symptoms of cholangitis at present,  she should remain on the current ciprofloxacin to decrease the chance of bacteremia during ERCP, especially given the location and nature of the obstructing tumor..  Her daughter was at the bedside for my entire visit, and all their questions were answered.   Charlie Pitter III Office:[phone]  '

## 2022-10-29 ENCOUNTER — Encounter (HOSPITAL_COMMUNITY): Payer: Self-pay | Admitting: Internal Medicine

## 2022-10-29 ENCOUNTER — Inpatient Hospital Stay (HOSPITAL_COMMUNITY): Payer: Medicare PPO

## 2022-10-29 ENCOUNTER — Encounter (HOSPITAL_COMMUNITY)
Admission: EM | Disposition: A | Discharge status: Hospice | Payer: Self-pay | Source: Home / Self Care | Attending: Internal Medicine

## 2022-10-29 ENCOUNTER — Inpatient Hospital Stay (HOSPITAL_COMMUNITY): Payer: Medicare PPO | Admitting: Anesthesiology

## 2022-10-29 DIAGNOSIS — R160 Hepatomegaly, not elsewhere classified: Secondary | ICD-10-CM | POA: Diagnosis not present

## 2022-10-29 DIAGNOSIS — K831 Obstruction of bile duct: Secondary | ICD-10-CM

## 2022-10-29 HISTORY — PX: BILIARY BRUSHING: SHX6843

## 2022-10-29 HISTORY — PX: ENDOSCOPIC RETROGRADE CHOLANGIOPANCREATOGRAPHY (ERCP) WITH PROPOFOL: SHX5810

## 2022-10-29 HISTORY — PX: BILIARY STENT PLACEMENT: SHX5538

## 2022-10-29 HISTORY — PX: SPHINCTEROTOMY: SHX5544

## 2022-10-29 SURGERY — ENDOSCOPIC RETROGRADE CHOLANGIOPANCREATOGRAPHY (ERCP) WITH PROPOFOL
Anesthesia: General

## 2022-10-29 MED ORDER — LIDOCAINE 2% (20 MG/ML) 5 ML SYRINGE
INTRAMUSCULAR | Status: DC | PRN
  Administered 2022-10-29: 50 mg via INTRAVENOUS

## 2022-10-29 MED ORDER — DICLOFENAC SUPPOSITORY 100 MG
RECTAL | Status: AC
  Filled 2022-10-29: qty 1

## 2022-10-29 MED ORDER — ONDANSETRON HCL 4 MG/2ML IJ SOLN
INTRAMUSCULAR | Status: DC | PRN
  Administered 2022-10-29: 4 mg via INTRAVENOUS

## 2022-10-29 MED ORDER — SUGAMMADEX SODIUM 200 MG/2ML IV SOLN
INTRAVENOUS | Status: DC | PRN
  Administered 2022-10-29: 200 mg via INTRAVENOUS

## 2022-10-29 MED ORDER — LACTATED RINGERS IV SOLN
INTRAVENOUS | Status: AC | PRN
  Administered 2022-10-29: 1000 mL via INTRAVENOUS

## 2022-10-29 MED ORDER — DICLOFENAC SUPPOSITORY 100 MG
RECTAL | Status: DC | PRN
  Administered 2022-10-29: 100 mg via RECTAL

## 2022-10-29 MED ORDER — FENTANYL CITRATE (PF) 100 MCG/2ML IJ SOLN
INTRAMUSCULAR | Status: AC
  Filled 2022-10-29: qty 2

## 2022-10-29 MED ORDER — DEXAMETHASONE SODIUM PHOSPHATE 10 MG/ML IJ SOLN
INTRAMUSCULAR | Status: DC | PRN
  Administered 2022-10-29: 10 mg via INTRAVENOUS

## 2022-10-29 MED ORDER — SODIUM CHLORIDE 0.9 % IV SOLN
INTRAVENOUS | Status: DC | PRN
  Administered 2022-10-29: 36 mL

## 2022-10-29 MED ORDER — PROPOFOL 10 MG/ML IV BOLUS
INTRAVENOUS | Status: DC | PRN
  Administered 2022-10-29: 110 mg via INTRAVENOUS

## 2022-10-29 MED ORDER — PHENYLEPHRINE 80 MCG/ML (10ML) SYRINGE FOR IV PUSH (FOR BLOOD PRESSURE SUPPORT)
PREFILLED_SYRINGE | INTRAVENOUS | Status: DC | PRN
Start: 2022-10-29 — End: 2022-10-29
  Administered 2022-10-29 (×3): 160 ug via INTRAVENOUS

## 2022-10-29 MED ORDER — GLUCAGON HCL RDNA (DIAGNOSTIC) 1 MG IJ SOLR
INTRAMUSCULAR | Status: AC
  Filled 2022-10-29: qty 2

## 2022-10-29 MED ORDER — FENTANYL CITRATE (PF) 100 MCG/2ML IJ SOLN
INTRAMUSCULAR | Status: DC | PRN
  Administered 2022-10-29: 100 ug via INTRAVENOUS

## 2022-10-29 MED ORDER — EPHEDRINE SULFATE-NACL 50-0.9 MG/10ML-% IV SOSY
PREFILLED_SYRINGE | INTRAVENOUS | Status: DC | PRN
  Administered 2022-10-29: 10 mg via INTRAVENOUS
  Administered 2022-10-29: 15 mg via INTRAVENOUS

## 2022-10-29 MED ORDER — CIPROFLOXACIN IN D5W 400 MG/200ML IV SOLN
INTRAVENOUS | Status: DC | PRN
  Administered 2022-10-29: 400 mg via INTRAVENOUS

## 2022-10-29 MED ORDER — CIPROFLOXACIN IN D5W 400 MG/200ML IV SOLN
INTRAVENOUS | Status: AC
  Filled 2022-10-29: qty 200

## 2022-10-29 MED ORDER — ROCURONIUM BROMIDE 10 MG/ML (PF) SYRINGE
PREFILLED_SYRINGE | INTRAVENOUS | Status: DC | PRN
  Administered 2022-10-29: 50 mg via INTRAVENOUS

## 2022-10-29 NOTE — Anesthesia Preprocedure Evaluation (Signed)
Anesthesia Evaluation  Patient identified by MRN, date of birth, ID band Patient awake    Reviewed: Allergy & Precautions, H&P , NPO status , Patient's Chart, lab work & pertinent test results  Airway Mallampati: II   Neck ROM: full    Dental   Pulmonary neg pulmonary ROS   breath sounds clear to auscultation       Cardiovascular hypertension,  Rhythm:regular Rate:Normal     Neuro/Psych    GI/Hepatic ,GERD  ,,  Endo/Other    Morbid obesity  Renal/GU      Musculoskeletal  (+) Arthritis ,    Abdominal   Peds  Hematology   Anesthesia Other Findings   Reproductive/Obstetrics                             Anesthesia Physical Anesthesia Plan  ASA: 2  Anesthesia Plan: General   Post-op Pain Management:    Induction: Intravenous  PONV Risk Score and Plan: 3 and Ondansetron, Dexamethasone and Treatment may vary due to age or medical condition  Airway Management Planned: Oral ETT  Additional Equipment:   Intra-op Plan:   Post-operative Plan: Extubation in OR  Informed Consent: I have reviewed the patients History and Physical, chart, labs and discussed the procedure including the risks, benefits and alternatives for the proposed anesthesia with the patient or authorized representative who has indicated his/her understanding and acceptance.     Dental advisory given  Plan Discussed with: CRNA, Anesthesiologist and Surgeon  Anesthesia Plan Comments:        Anesthesia Quick Evaluation

## 2022-10-29 NOTE — Progress Notes (Signed)
[facility] Progress Note  Patient: Carolyn Fowler    ZOX:[id number]  DOA: 10/27/2022     Date of Service: the patient was seen and examined on 10/29/2022  Chief Complaint  Patient presents with   Liver problem   Brief hospital course: SHEVY FOUGEROUSSE is a 77 y.o. with medical h/o HTN, LVH, chronic low back pain reports that over the past 10 days she has been more lethargic and noticed progressive symptoms of symptoms of dark urine, generalized pruritus, eyes and skin yellowing, bowel urgency then pale stools with some near incontinence, loss of appetite, and minor sharp pains in the RUQ mild intermittent without radiation or fever. She denies worsening reflux,other abd pain, dysphagia, n/v, or blood in stool. She has no prior h/o liver disease or pancreatic disease. She presented to [facility] and saw Dr. Jonny Ruiz. She was diagnosed with painless jaundice. Appropriate labs and imaging were ordered revealing abnormal liver functions with elevated total and direct bilirubin, CT revealing ill defined hypdense porta hepatic hypodense mass 5.8 x 4.7 cm with encasement of hepatic portal veings, hepatic arteries, common bile duct with assoicated cholecystitis. Also revealed were large necrotic porta hepatic lymph nodes. She was instructed to present to [facility] for further evaluation.      ED Course: 98.4  119/81  HR 66  18. Patient in no distress but concerned. Denies discomfort. Lab: K 3.3, Alk phos 257, AST 175 ALT 245, NH3 38 T Bili 19.5, D. Bili 12.8, INR 1.1. CT as per HPI. [facility] called to admit for continued evaluation of hepatic mass and management of medical problems  10/29/2022: Patient seen alongside patient's 2 daughters and some family friends.  Patient underwent ERCP earlier today.  Stricture of the common hepatic duct was noted at the hilum.  Stent was placed to the right hepatic duct stenosis.  However, on the left side, the wire could not past the area of the  hilum.  GI input is appreciated.  Patient remains sleepy.  No significant history from patient.    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 This is highly suspicious for a malignant lesion.  MR/MRCP reviewed.  General surgery recommended GI consult for ERCP. Discussed with GI, recommended IR consult for PTC/PBD Keep n.p.o. after midnight IR for percutaneous transhepatic cholangiogram/percutaneous biliary drain and liver biopsy. Patient will need oncology consult after biopsy report for further management  For pruritus, use hydroxyzine plus H2 blocker  10/29/2022: See above documentation.  Follow cytology.  If cytology is not diagnostic, further workup as documented by the GI team.   # Essential hypertension Blood pressure is soft Home meds amlodipine, Coreg, HCTZ, irbesartan Continued Coreg today with holding parameters Skip the dose of amlodipine, HCTZ and irbesartan, will resume tomorrow a.m. with holding parameters, Monitor BP and titrate medications accordingly If blood pressure remains soft then DC some medications and decrease the dose of Coreg. 10/29/2022: Blood pressure is controlled.    GERD (gastroesophageal reflux disease) Patient denies active symptoms and has not been taking medications. Started Pepcid twice daily   Body mass index is 42.43 kg/m.  Interventions:  Diet: Regular diet DVT Prophylaxis: Subcutaneous Lovenox and SCD before PTC/PBD insertion   Advance goals of care discussion: DNR  Family Communication: family was present at bedside, at the time of interview.  The pt provided permission to discuss medical  plan with the family. Opportunity was given to ask question and all questions were answered satisfactorily.   Disposition:  Pt is from Home, admitted with hepatobiliary mass, most likely  cholangiocarcinoma, needs PTC/PBD, liver biopsy which needs to be done by IR, patient will need oncology consult, which precludes a safe discharge. Discharge to Home, when stable.  Subjective: No significant events overnight, patient denies any abdominal pain, no chest pain or palpitation, no shortness of breath.  Physical Exam: General: Patient remains sleepy.  Patient is jaundiced.  Not in any obvious distress. HEENT: Patient is pale and jaundiced.   Neck: Supple.  JVD is difficult to assess.    Cardiovascular: S1 and S2   Respiratory: Clear to auscultation.   Abdomen: Obese, soft and nontender.   Extremities: No lower extremity edema.   Neurologic: Sleepy but arousable.  Vitals:   10/29/22 1520 10/29/22 1530 10/29/22 1540 10/29/22 1614  BP: 127/72 130/72 127/71 124/74  Pulse: (!) 57 (!) 57 (!) 58 (!) 59  Resp: (!) 28 (!) 27 (!) 25 (!) 22  Temp:      TempSrc:      SpO2: 94% 94% 93% 98%  Weight:      Height:        Intake/Output Summary (Last 24 hours) at 10/29/2022 1703 Last data filed at 10/29/2022 1424 Gross per 24 hour  Intake 1998.33 ml  Output 2100 ml  Net -101.67 ml   Filed Weights   10/27/22 1614 10/29/22 1232  Weight: 105.2 kg 105.2 kg     CBC: Recent Labs  Lab 10/25/22 1613 10/27/22 1641 10/28/22 0130  WBC 7.4 6.3 5.7  NEUTROABS 3.6  --   --   HGB 13.7 13.1 11.9*  HCT 43.2 40.0 35.7*  MCV 73.5* 71.3* 70.8*  PLT 230.0 241 215   Basic Metabolic Panel: Recent Labs  Lab 10/25/22 1613 10/27/22 1641 10/28/22 0130  NA 137 132*  --   K 3.3* 3.3*  --   CL 95* 94*  --   CO2 29 25  --   GLUCOSE 110* 116*  --   BUN 11 10  --   CREATININE 0.68 0.47 0.47  CALCIUM 9.8 9.4  --     Studies: DG C-Arm 1-60 Min-No Report  Result Date: 10/29/2022 Fluoroscopy was utilized by the requesting physician.  No radiographic interpretation.    Scheduled Meds:  amLODipine  5 mg Oral Daily   carvedilol  25 mg Oral BID   [START ON 10/30/2022] enoxaparin (LOVENOX)  injection  40 mg Subcutaneous QPM   famotidine  20 mg Oral BID   hydrochlorothiazide  25 mg Oral Daily   hydrOXYzine  10 mg Oral TID   irbesartan  300 mg Oral Daily   senna  1 tablet Oral BID   Continuous Infusions:  ciprofloxacin     dextrose 5 % and 0.45 % NaCl 75 mL/hr at 10/29/22 0400   PRN Meds: acetaminophen **OR** acetaminophen, traZODone  Time spent: 55 minutes  Author: Modena Morrow, MD  [facility] 10/29/2022 5:03 PM  To reach On-call, see care teams to locate the attending and reach out to them via [email or web address]. If 7PM-7AM, please contact night-coverage If you still have difficulty reaching the attending provider, please page the [facility] (Director on Call) for [facility] on amion for assistance.

## 2022-10-29 NOTE — Interval H&P Note (Signed)
History and Physical Interval Note:  10/29/2022 12:50 PM  Carolyn Fowler  has presented today for surgery, with the diagnosis of biliary obstruction.  The various methods of treatment have been discussed with the patient and family. After consideration of risks, benefits and other options for treatment, the patient has consented to  Procedure(s): ENDOSCOPIC RETROGRADE CHOLANGIOPANCREATOGRAPHY (ERCP) WITH PROPOFOL (N/A) as a surgical intervention.  The patient's history has been reviewed, patient examined, no change in status, stable for surgery.  I have reviewed the patient's chart and labs.  Questions were answered to the patient's satisfaction.     Stan Head

## 2022-10-29 NOTE — Anesthesia Procedure Notes (Signed)
Procedure Name: Intubation Date/Time: 10/29/2022 1:06 PM  Performed by: Doran Clay, CRNAPre-anesthesia Checklist: Patient identified, Emergency Drugs available, Suction available, Patient being monitored and Timeout performed Patient Re-evaluated:Patient Re-evaluated prior to induction Oxygen Delivery Method: Circle system utilized Preoxygenation: Pre-oxygenation with 100% oxygen Induction Type: IV induction Ventilation: Mask ventilation without difficulty and Oral airway inserted - appropriate to patient size Laryngoscope Size: Mac and 4 Grade View: Grade I Tube type: Oral Tube size: 7.0 mm Number of attempts: 1 Airway Equipment and Method: Stylet Placement Confirmation: ETT inserted through vocal cords under direct vision, positive ETCO2 and breath sounds checked- equal and bilateral Secured at: 21 cm Tube secured with: Tape Dental Injury: Teeth and Oropharynx as per pre-operative assessment

## 2022-10-29 NOTE — Plan of Care (Signed)
  Problem: Health Behavior/Discharge Planning: Goal: Ability to manage health-related needs will improve Outcome: Progressing   Problem: Clinical Measurements: Goal: Ability to maintain clinical measurements within normal limits will improve Outcome: Progressing Goal: Will remain free from infection Outcome: Progressing   

## 2022-10-29 NOTE — Op Note (Signed)
[facility] Patient Name: Carolyn Fowler Procedure Date: 10/29/2022 MRN: [id number] Attending MD: Iva Boop , MD, [id number] Date of Birth: Apr 21, 1945 CSN: [id number] Age: 77 Admit Type: Inpatient Procedure:                ERCP Indications:              Malignant Bismuth type II stricture (involving the                            confluence of the right and left hepatic ducts),                            Jaundice Providers:                Iva Boop, MD, Fransisca Connors, Salley Scarlet, Technician, Albertina Senegal. Alday CRNA, CRNA Referring MD:              Medicines:                General Anesthesia + cipro 400 mg IV and diclofenac                            100 mg pr Complications:            No immediate complications. Estimated Blood Loss:     Estimated blood loss: none. Procedure:                Pre-Anesthesia Assessment:                           - Prior to the procedure, a History and Physical                            was performed, and patient medications and                            allergies were reviewed. The patient's tolerance of                            previous anesthesia was also reviewed. The risks                            and benefits of the procedure and the sedation                            options and risks were discussed with the patient.                            All questions were answered, and informed consent                            was obtained. Prior Anticoagulants: The patient  last took Lovenox (enoxaparin) 1 day prior to the                            procedure. ASA Grade Assessment: II - A patient                            with mild systemic disease. After reviewing the                            risks and benefits, the patient was deemed in                            satisfactory condition to undergo the procedure.                           After obtaining  informed consent, the scope was                            passed under direct vision. Throughout the                            procedure, the patient's blood pressure, pulse, and                            oxygen saturations were monitored continuously. The                            [employer] single use                            duodenoscope was introduced through the mouth, and                            used to inject contrast into and used to inject                            contrast into the bile duct. The ERCP was                            technically difficult and complex due to difficulty                            passing guidewires through biliary ductal stenosis.                            The patient tolerated the procedure well. Scope In: Scope Out: Findings:      The scout film revealed lumbar fusion hardware , [place] not seen       well, stomach and duodenum grossly normal. the papilla was normal and       cannulated deeply w/ 035 wire via sphincterotome. There was a stricture       in the common hepatic duct atthe hilum. I was able to get contrast into  a markedly dilated right intrahepatic system. An 025 wire was able to       pass into this area and through a strictre that was 2-3 cm long, I       think. Balloon inserted and contrast injected but could not fill out the       left intrahepatics. Cytology brushing of the stricture performed x 1. To       facilitate stent passage, I then performed a biliary sphincterotomy over       a wire. Then a 7 Fr 12 cm stent was placed into right system but was too       long so it was removed and I placed a 7 Fr 9 cm platic stent and green       bile drained. I then tried to pass an 025 wire into the left system but       the wire impacted and coiled and would not pass in the area of the       hilum. Procedure terminated, no pancreatic entry. Impression:               - A single localized biliary  stricture was found in                            the hepatic duct system (Bismuth II). The stricture                            was malignant appearing. This was sampled by                            brushing. This stricture was treated with stent                            placement.                           NOTE THAT REVIEW OF IMAGING WITH IR RADILOGIST                            INDICATES THAT PROBLEM IS MORE OF LYMPH NODES VS                            LIVER OR BILE DUCT MASS AND THERE IS ? OF                            METASTASES FROM A PRIMARY ELSEWHERE Moderate Sedation:      Not Applicable - Patient had care per Anesthesia. Recommendation:           - return to floor and follow labs and pruritis.                            This may be adequate to palliate though probably                            not. I suspect next step would be PTC of left  system if needed.                           If cytology not + (likely) will need lymph node bx                            - there is a para-aortic node that was reviewed w/                            Ir and possible vs EUS-FNA but EUS availability                            very limited.                           Clear liquids only today and advance diet tomorrow                            if ok.                           Will follow Procedure Code(s):        --- Professional ---                           [phone], Endoscopic retrograde                            cholangiopancreatography (ERCP); with placement of                            endoscopic stent into biliary or pancreatic duct,                            including pre- and post-dilation and guide wire                            passage, when performed, including sphincterotomy,                            when performed, each stent                           43262, 59, Endoscopic retrograde                            cholangiopancreatography (ERCP); with                             sphincterotomy/papillotomy Diagnosis Code(s):        --- Professional ---                           K83.1, Obstruction of bile duct                           R17, Unspecified jaundice CPT copyright 2022 American  Medical Association. All rights reserved. The codes documented in this report are preliminary and upon coder review may  be revised to meet current compliance requirements. Iva Boop, MD 10/29/2022 2:45:01 PM This report has been signed electronically. Number of Addenda: 0

## 2022-10-29 NOTE — Progress Notes (Signed)
   10/29/22 1018  TOC Brief Assessment  Insurance and Status Reviewed  Patient has primary care physician Yes  Home environment has been reviewed home - children involved  Prior level of function: independent  Prior/Current Home Services No current home services  Social Determinants of Health Reivew SDOH reviewed no interventions necessary  Readmission risk has been reviewed Yes  Transition of care needs no transition of care needs at this time

## 2022-10-29 NOTE — Progress Notes (Signed)
Report given to bedside RN. Addressed respiratory rate being 25-30. Patient not in distress, still sleepy from anesthesia and breathing through mouth. Patient Alert and Oriented. Ate ice chips. Dr. Chaney Malling from anesthesia came by to see patient in endo recovery before taking patient back to the floor. He said patient was well enough for med-surg level of care and was appropriate for the floor. Baseline vitals taken in endo pre-procedure respiratory rate was 25. Bedside RN updated

## 2022-10-29 NOTE — Telephone Encounter (Signed)
Patients daughter wanted to inform you that her mom is currently at [facility] long and she has Stage 4 cancer

## 2022-10-29 NOTE — Progress Notes (Signed)
Mobility Specialist - Progress Note   10/29/22 0851  Mobility  Activity Ambulated with assistance in hallway  Level of Assistance Standby assist, set-up cues, supervision of patient - no hands on  Assistive Device Front wheel walker  Distance Ambulated (ft) 250 ft  Activity Response Tolerated well  Mobility Referral Yes  $Mobility charge 1 Mobility  Mobility Specialist Start Time (ACUTE ONLY) 0934  Mobility Specialist Stop Time (ACUTE ONLY) 0950  Mobility Specialist Time Calculation (min) (ACUTE ONLY) 16 min   Pt received in bed and agreeable to mobility. No complaints during session. Pt required minA with getting back to bed. Pt to bed after session with all needs met.    [facility]

## 2022-10-29 NOTE — Transfer of Care (Signed)
Immediate Anesthesia Transfer of Care Note  Patient: Carolyn Fowler  Procedure(s) Performed: ENDOSCOPIC RETROGRADE CHOLANGIOPANCREATOGRAPHY (ERCP) WITH PROPOFOL SPHINCTEROTOMY REMOVAL OF STONES BILIARY STENT PLACEMENT BILIARY BRUSHING  Patient Location: PACU  Anesthesia Type:General  Level of Consciousness: sedated  Airway & Oxygen Therapy: Patient Spontanous Breathing and Patient connected to face mask oxygen  Post-op Assessment: Report given to RN and Post -op Vital signs reviewed and stable  Post vital signs: Reviewed and stable  Last Vitals:  Vitals Value Taken Time  BP 120/64 10/29/22 1426  Temp    Pulse 56 10/29/22 1427  Resp 30 10/29/22 1427  SpO2 97 % 10/29/22 1427  Vitals shown include unfiled device data.  Last Pain:  Vitals:   10/29/22 1232  TempSrc: Temporal  PainSc: 0-No pain         Complications: No notable events documented.

## 2022-10-30 DIAGNOSIS — R160 Hepatomegaly, not elsewhere classified: Secondary | ICD-10-CM | POA: Diagnosis not present

## 2022-10-30 DIAGNOSIS — R17 Unspecified jaundice: Secondary | ICD-10-CM

## 2022-10-30 LAB — COMPREHENSIVE METABOLIC PANEL
ALT: 166 U/L — ABNORMAL HIGH (ref 0–44)
AST: 111 U/L — ABNORMAL HIGH (ref 15–41)
Albumin: 2.5 g/dL — ABNORMAL LOW (ref 3.5–5.0)
Alkaline Phosphatase: 263 U/L — ABNORMAL HIGH (ref 38–126)
Anion gap: 9 (ref 5–15)
BUN: 16 mg/dL (ref 8–23)
CO2: 26 mmol/L (ref 22–32)
Calcium: 8.5 mg/dL — ABNORMAL LOW (ref 8.9–10.3)
Chloride: 98 mmol/L (ref 98–111)
Creatinine, Ser: 0.87 mg/dL (ref 0.44–1.00)
GFR, Estimated: >60 mL/min (ref >60)
Glucose, Bld: 132 mg/dL — ABNORMAL HIGH (ref 70–99)
Potassium: 3.4 mmol/L — ABNORMAL LOW (ref 3.5–5.1)
Sodium: 133 mmol/L — ABNORMAL LOW (ref 135–145)
Total Bilirubin: 11.8 mg/dL — ABNORMAL HIGH (ref 0.3–1.2)
Total Protein: 6.2 g/dL — ABNORMAL LOW (ref 6.5–8.1)

## 2022-10-30 LAB — CBC
HCT: 35.4 % — ABNORMAL LOW (ref 36.0–46.0)
Hemoglobin: 11.5 g/dL — ABNORMAL LOW (ref 12.0–15.0)
MCH: 23.2 pg — ABNORMAL LOW (ref 26.0–34.0)
MCHC: 32.5 g/dL (ref 30.0–36.0)
MCV: 71.4 fL — ABNORMAL LOW (ref 80.0–100.0)
Platelets: 232 10*3/uL (ref 150–400)
RBC: 4.96 MIL/uL (ref 3.87–5.11)
RDW: 18.3 % — ABNORMAL HIGH (ref 11.5–15.5)
WBC: 6.4 10*3/uL (ref 4.0–10.5)
nRBC: 0 % (ref 0.0–0.2)

## 2022-10-30 NOTE — Progress Notes (Signed)
Mobility Specialist - Progress Note   10/30/22 0906  Mobility  Activity Ambulated with assistance in hallway;Ambulated with assistance to bathroom  Level of Assistance Standby assist, set-up cues, supervision of patient - no hands on  Assistive Device Front wheel walker  Distance Ambulated (ft) 400 ft  Activity Response Tolerated well  Mobility Referral Yes  $Mobility charge 1 Mobility  Mobility Specialist Start Time (ACUTE ONLY) [id number]  Mobility Specialist Stop Time (ACUTE ONLY) 0905  Mobility Specialist Time Calculation (min) (ACUTE ONLY) 32 min   Pt received in bed and agreeable to mobility. Prior to ambulating pt requested assistance to bathroom. No complaints during session. Pt to bed after session with all needs met & MD in room.    [facility]

## 2022-10-30 NOTE — Plan of Care (Signed)
  Problem: Safety: Goal: Ability to remain free from injury will improve Outcome: Progressing   

## 2022-10-30 NOTE — Progress Notes (Signed)
Patient Name: Carolyn Fowler Date of Encounter: 10/30/2022, 11:03 AM     Assessment and Plan  77 y.o. year old female with biliary obstruction - originally imaging read as suspected mass but review suggests porta hepatis adenopathy and no clear tumor mass, + other adenopathy as well so ? Metastatic disease from distant primary, lymphoma? vs a cholangiocarcinoma  ERCP 8/23 - hilar obstruction - able to stent right system 7 Fr 9 cm stent and cytology taken   Improving bilirubin and pruritus w/ one stent - await cytology return and determine next steps - if cytology not diagnostic (likely) then I think she will need an IR biopsy - paraaortic node identified by IR when I reviewed images. So far a nice response to only right-side drainage - could still need left side drained but hopefully not as that would need to be percutaneous   I will check a CA 19-9 though unlikely to change approach outlined above could come into play  We will f/u 8/26 unless other problems arise and we are needed   Cholelithiasis. MRI showing distended gallbladder with gallbladder wall thickening and edema - from biliary obstruction I think   Chronic microcytosis, slight anemia - stable   ? 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 seen on MRI   History of adenomatous polyps   Additional co-morbidities include : HTN, LVH, chronic low back pain   Subjective  Feels wll, less itching and no pain, hungry for first time in 2 weeks   Objective  BP 129/74 (BP Location: Right Arm)   Pulse (!) 55   Temp 98.3 F (36.8 C) (Oral)   Resp 20   Ht 5\' 2"  (1.575 m)   Wt 105.2 kg   SpO2 97%   BMI 42.43 kg/m  NAD Icteric Obese abd soft and nontender  Recent Labs  Lab 10/25/22 1613 10/27/22 1530 10/27/22 1641 10/27/22 1817 10/30/22 0527  AST 174* 165* 175*  --  111*  ALT 278* 236* 245*  --  166*  ALKPHOS 290* 286* 257*  --  263*  BILITOT 18.9* 19.2* 19.5*  --  11.8*  PROT 7.7 6.9 7.2   --  6.2*  ALBUMIN 4.0 3.6 3.0*  --  2.5*  INR 1.2*  --   --  1.1  --    Recent Labs  Lab 10/25/22 1613 10/27/22 1641 10/28/22 0130 10/30/22 0527  NA 137 132*  --  133*  K 3.3* 3.3*  --  3.4*  CL 95* 94*  --  98  CO2 29 25  --  26  GLUCOSE 110* 116*  --  132*  BUN 11 10  --  16  CREATININE 0.68 0.47 0.47 0.87  CALCIUM 9.8 9.4  --  8.5*   Recent Labs  Lab 10/27/22 1641 10/28/22 0130 10/30/22 0527  HGB 13.1 11.9* 11.5*  HCT 40.0 35.7* 35.4*  WBC 6.3 5.7 6.4  PLT 241 215 232   ERCP 10/29/22    - A single localized biliary stricture was found in                            the hepatic duct system (Bismuth II). The stricture                            was malignant appearing. This was sampled by  brushing. This stricture was treated with stent                            placement.                           NOTE THAT REVIEW OF IMAGING WITH IR RADILOGIST     INDICATES THAT PROBLEM IS MORE OF LYMPH NODES VS                            LIVER OR BILE DUCT MASS AND THERE IS ? OF                            METASTASES FROM A PRIMARY ELSEWHERE  Iva Boop, MD, [facility] Gastroenterology See Loretha Stapler on call - gastroenterology for best contact person 10/30/2022 11:03 AM

## 2022-10-30 NOTE — Progress Notes (Signed)
[facility] Progress Note  Patient: Carolyn Fowler    LKG:[id number]  DOA: 10/27/2022     Date of Service: the patient was seen and examined on 10/30/2022  Chief Complaint  Patient presents with   Liver problem   Brief hospital course: Carolyn Fowler is a 77 y.o. with medical h/o HTN, LVH, chronic low back pain reports that over the past 10 days she has been more lethargic and noticed progressive symptoms of symptoms of dark urine, generalized pruritus, eyes and skin yellowing, bowel urgency then pale stools with some near incontinence, loss of appetite, and minor sharp pains in the RUQ mild intermittent without radiation or fever. She denies worsening reflux,other abd pain, dysphagia, n/v, or blood in stool. She has no prior h/o liver disease or pancreatic disease. She presented to [facility] and saw Dr. Jonny Ruiz. She was diagnosed with painless jaundice. Appropriate labs and imaging were ordered revealing abnormal liver functions with elevated total and direct bilirubin, CT revealing ill defined hypdense porta hepatic hypodense mass 5.8 x 4.7 cm with encasement of hepatic portal veings, hepatic arteries, common bile duct with assoicated cholecystitis. Also revealed were large necrotic porta hepatic lymph nodes. She was instructed to present to [facility] for further evaluation.      ED Course: 98.4  119/81  HR 66  18. Patient in no distress but concerned. Denies discomfort. Lab: K 3.3, Alk phos 257, AST 175 ALT 245, NH3 38 T Bili 19.5, D. Bili 12.8, INR 1.1. CT as per HPI. [facility] called to admit for continued evaluation of hepatic mass and management of medical problems  10/29/2022: Patient seen alongside patient's 2 daughters and some family friends.  Patient underwent ERCP earlier today.  Stricture of the common hepatic duct was noted at the hilum.  Stent was placed to the right hepatic duct stenosis.  However, on the left side, the wire could not past the area of the  hilum.  GI input is appreciated.  Patient remains sleepy.  No significant history from patient.  10/30/2022: Patient seen alongside patient's daughter, patient's friend and daughter as best friend.  Patient looks a lot better today.  Patient is quite communicative today.  Vitals are stable.  Renal panel reveals sodium of 133, potassium of 3.4, alkaline phosphatase of 263, AST of 111, ALT of 166, total bili of 11.8 (down from 19.5).  AST and ALT are on downward trend.  Hemoglobin is 11.5 g/dL.    Assessment and Plan: Hepatobiliary mass most likely cholangiocarcinoma  Patient with newly diagnosed hypodense mass at the porta- hepatics with encasement of major vessels, with necrotic lymph nodes. This is highly suspicious for a malignant lesion.  MR/MRCP reviewed.  General surgery recommended GI consult for ERCP. Discussed with GI, recommended IR consult for PTC/PBD Keep n.p.o. after midnight IR for percutaneous transhepatic cholangiogram/percutaneous biliary drain and liver biopsy. Patient will need oncology consult after biopsy report for further management  For pruritus, use hydroxyzine plus H2 blocker  10/29/2022: See above documentation.  Follow cytology.  If cytology is not diagnostic, further workup as documented by the GI team. 10/30/2022: Bilirubin is down.  AST and ALT are down.  Alkaline phosphatase is down.  Follow cytology.  GI input is appreciated.  According to the daughter, patient may not be interested in chemotherapy or any form of cancer treatment.  Will continue to discuss with patient.   Essential hypertension Blood pressure is soft Home meds amlodipine, Coreg, HCTZ, irbesartan Continued Coreg today  with holding parameters Skip the dose of amlodipine, HCTZ and irbesartan, will resume tomorrow a.m. with holding parameters, Monitor BP and titrate medications accordingly If blood pressure remains soft then DC some medications and decrease the dose of Coreg. 10/30/2022: Blood pressure  is controlled.    GERD (gastroesophageal reflux disease) Patient denies active symptoms and has not been taking medications. Started Pepcid twice daily   Body mass index is 42.43 kg/m.  Interventions:  Diet: Regular diet DVT Prophylaxis: Subcutaneous Lovenox and SCD before PTC/PBD insertion   Advance goals of care discussion: DNR  Family Communication: family was present at bedside, at the time of interview.  The pt provided permission to discuss medical plan with the family. Opportunity was given to ask question and all questions were answered satisfactorily.   Disposition:  Pt is from Home, admitted with hepatobiliary mass, most likely cholangiocarcinoma, needs PTC/PBD, liver biopsy which needs to be done by IR, patient will need oncology consult, which precludes a safe discharge. Discharge to Home, when stable.  Subjective:  -Patient looks a lot better today. -Appetite has improved. -No itching. -Patient is quite communicative/talkative today.    Physical Exam: General: Patient is awake and alert.  Not in any distress.   HEENT: Patient is pale and jaundiced.   Neck: Supple.  JVD is difficult to assess.    Cardiovascular: S1 and S2   Respiratory: Clear to auscultation.   Abdomen: Obese, soft and nontender.   Extremities: No lower extremity edema.   Neurologic: Awake and alert.  Patient moves all extremities. Vitals:   10/29/22 1929 10/30/22 0020 10/30/22 0620 10/30/22 0936  BP: 114/70 (!) 104/52 131/67 129/74  Pulse: 69 (!) 56 (!) 52 (!) 55  Resp: 17 18 18 20   Temp: 98.4 F (36.9 C) 98.2 F (36.8 C) 97.8 F (36.6 C) 98.3 F (36.8 C)  TempSrc: Oral Oral Oral Oral  SpO2: 93% 95% 96% 97%  Weight:      Height:        Intake/Output Summary (Last 24 hours) at 10/30/2022 1328 Last data filed at 10/30/2022 0935 Gross per 24 hour  Intake 1682.3 ml  Output 1200 ml  Net 482.3 ml   Filed Weights   10/27/22 1614 10/29/22 1232  Weight: 105.2 kg 105.2 kg      CBC: Recent Labs  Lab 10/25/22 1613 10/27/22 1641 10/28/22 0130 10/30/22 0527  WBC 7.4 6.3 5.7 6.4  NEUTROABS 3.6  --   --   --   HGB 13.7 13.1 11.9* 11.5*  HCT 43.2 40.0 35.7* 35.4*  MCV 73.5* 71.3* 70.8* 71.4*  PLT 230.0 241 215 232   Basic Metabolic Panel: Recent Labs  Lab 10/25/22 1613 10/27/22 1641 10/28/22 0130 10/30/22 0527  NA 137 132*  --  133*  K 3.3* 3.3*  --  3.4*  CL 95* 94*  --  98  CO2 29 25  --  26  GLUCOSE 110* 116*  --  132*  BUN 11 10  --  16  CREATININE 0.68 0.47 0.47 0.87  CALCIUM 9.8 9.4  --  8.5*    Studies: DG ERCP  Result Date: 10/30/2022 CLINICAL DATA:  ERCP for porta hepatis mass. EXAM: ERCP TECHNIQUE: Multiple spot images obtained with the fluoroscopic device and submitted for interpretation post-procedure. FLUOROSCOPY TIME: FLUOROSCOPY TIME 9 minutes, 17 seconds (401 mGy) COMPARISON:  MRCP-10/27/2022 FINDINGS: 9 spot intraoperative fluoroscopic images of the right upper abdominal quadrant during ERCP are provided for review. Initial image demonstrates an ERCP probe  overlying the right upper abdominal quadrant. Sequela of previous lower lumbar paraspinal fusion, incompletely evaluated. Subsequent images demonstrate selective cannulation and opacification of the common bile duct. The common bile duct appears of normal caliber however there appears to be a tapered narrowing at the level of the biliary hilum with associated upstream intrahepatic biliary ductal dilatation. Subsequent images demonstrate placement of an internal biliary stent traversing the location of narrowing at the level of the hilum. IMPRESSION: ERCP with biliary stent placement as above. These images were submitted for radiologic interpretation only. Please see the procedural report for the amount of contrast and the fluoroscopy time utilized. Electronically Signed   By: Simonne Come M.D.   On: 10/30/2022 09:42   DG C-Arm 1-60 Min-No Report  Result Date: 10/29/2022 Fluoroscopy  was utilized by the requesting physician.  No radiographic interpretation.    Scheduled Meds:  amLODipine  5 mg Oral Daily   carvedilol  25 mg Oral BID   enoxaparin (LOVENOX) injection  40 mg Subcutaneous QPM   famotidine  20 mg Oral BID   hydrochlorothiazide  25 mg Oral Daily   hydrOXYzine  10 mg Oral TID   irbesartan  300 mg Oral Daily   senna  1 tablet Oral BID   Continuous Infusions:  ciprofloxacin     dextrose 5 % and 0.45 % NaCl 75 mL/hr at 10/30/22 0935   PRN Meds: acetaminophen **OR** acetaminophen, traZODone  Time spent: 35 minutes  Author: Modena Morrow, MD  [facility] 10/30/2022 1:28 PM  To reach On-call, see care teams to locate the attending and reach out to them via [email or web address]. If 7PM-7AM, please contact night-coverage If you still have difficulty reaching the attending provider, please page the [facility] (Director on Call) for [facility] on amion for assistance.

## 2022-10-31 ENCOUNTER — Encounter (HOSPITAL_COMMUNITY): Payer: Self-pay | Admitting: Internal Medicine

## 2022-10-31 DIAGNOSIS — R160 Hepatomegaly, not elsewhere classified: Secondary | ICD-10-CM | POA: Diagnosis not present

## 2022-10-31 LAB — CBC WITH DIFFERENTIAL/PLATELET
Abs Immature Granulocytes: 0.06 10*3/uL (ref 0.00–0.07)
Basophils Absolute: 0 10*3/uL (ref 0.0–0.1)
Basophils Relative: 1 %
Eosinophils Absolute: 0.2 10*3/uL (ref 0.0–0.5)
Eosinophils Relative: 3 %
HCT: 34.9 % — ABNORMAL LOW (ref 36.0–46.0)
Hemoglobin: 11.3 g/dL — ABNORMAL LOW (ref 12.0–15.0)
Immature Granulocytes: 1 %
Lymphocytes Relative: 29 %
Lymphs Abs: 2.3 10*3/uL (ref 0.7–4.0)
MCH: 23.3 pg — ABNORMAL LOW (ref 26.0–34.0)
MCHC: 32.4 g/dL (ref 30.0–36.0)
MCV: 72.1 fL — ABNORMAL LOW (ref 80.0–100.0)
Monocytes Absolute: 0.6 10*3/uL (ref 0.1–1.0)
Monocytes Relative: 8 %
Neutro Abs: 4.7 10*3/uL (ref 1.7–7.7)
Neutrophils Relative %: 58 %
Platelets: 257 10*3/uL (ref 150–400)
RBC: 4.84 MIL/uL (ref 3.87–5.11)
RDW: 18.3 % — ABNORMAL HIGH (ref 11.5–15.5)
WBC: 8 10*3/uL (ref 4.0–10.5)
nRBC: 0 % (ref 0.0–0.2)

## 2022-10-31 LAB — COMPREHENSIVE METABOLIC PANEL
ALT: 122 U/L — ABNORMAL HIGH (ref 0–44)
AST: 75 U/L — ABNORMAL HIGH (ref 15–41)
Albumin: 2.5 g/dL — ABNORMAL LOW (ref 3.5–5.0)
Alkaline Phosphatase: 244 U/L — ABNORMAL HIGH (ref 38–126)
Anion gap: 12 (ref 5–15)
BUN: 15 mg/dL (ref 8–23)
CO2: 26 mmol/L (ref 22–32)
Calcium: 8.4 mg/dL — ABNORMAL LOW (ref 8.9–10.3)
Chloride: 98 mmol/L (ref 98–111)
Creatinine, Ser: 0.54 mg/dL (ref 0.44–1.00)
GFR, Estimated: >60 mL/min (ref >60)
Glucose, Bld: 92 mg/dL (ref 70–99)
Potassium: 3.1 mmol/L — ABNORMAL LOW (ref 3.5–5.1)
Sodium: 136 mmol/L (ref 135–145)
Total Bilirubin: 8.4 mg/dL — ABNORMAL HIGH (ref 0.3–1.2)
Total Protein: 6.1 g/dL — ABNORMAL LOW (ref 6.5–8.1)

## 2022-10-31 LAB — PHOSPHORUS: Phosphorus: 2.9 mg/dL (ref 2.5–4.6)

## 2022-10-31 LAB — MAGNESIUM: Magnesium: 2 mg/dL (ref 1.7–2.4)

## 2022-10-31 MED ORDER — POTASSIUM CHLORIDE 20 MEQ PO PACK
40.0000 meq | PACK | ORAL | Status: AC
  Administered 2022-10-31 (×2): 40 meq via ORAL
  Filled 2022-10-31 (×2): qty 2

## 2022-10-31 NOTE — Plan of Care (Signed)
  Problem: Skin Integrity: Goal: Risk for impaired skin integrity will decrease Outcome: Progressing   Problem: Skin Integrity: Goal: Risk for impaired skin integrity will decrease Outcome: Progressing

## 2022-10-31 NOTE — Progress Notes (Signed)
[facility] Progress Note  Patient: Carolyn Fowler    VPX:[id number]  DOA: 10/27/2022     Date of Service: the patient was seen and examined on 10/31/2022  Chief Complaint  Patient presents with   Liver problem   Brief hospital course: Patient is a 77 year old female with past medical history significant for hypertension, LVH and chronic low back pain.  Patient was admitted with 1 month history of itching, 10-day history of jaundice, worsening lethargy and poor appetite.  No prior h/o liver disease or pancreatic disease.  Patient was referred to the hospital for further evaluation and management of painless jaundice.  Abnormal liver functions with elevated total and direct bilirubin, CT scan revealed ill defined hypdense porta hepatic hypodense mass 5.8 x 4.7 cm with encasement of hepatic portal veings, hepatic arteries, common bile duct with assoicated cholecystitis.  CT also revealed large necrotic porta hepatic lymph nodes.  Initial lab work revealed potassium of 3.3, Alk phos 257, AST 175, ALT 245, NH3 38 T Bili 19.5, D. Bili 12.8, INR 1.1.  Findings were concerning for cholangiocarcinoma.  Patient is undergoing ERCP.  Stricture of the common hepatic duct was noted at the hilum.  Stent was placed to the right hepatic duct stenosis.  However, on the left side, the wire could not pass the area of the hilum.  GI input is appreciated.  Subsequent lab work has revealed improving LFTs, and lowering of the bilirubin.  Cytology result is pending.  Further management will depend on hospital course.  Patient is not particularly keen on chemotherapy i.e. if cytology comes back as malignancy.  10/31/2022: Vitals are stable.  No new labs visualized.  Will order new labs.  Patient reports poor appetite today.  Seen alongside 2 daughters and sons, as well as, other family members.    Assessment and Plan: Hepatobiliary mass: -Concerning for cholangiocarcinoma.   -Patient with newly diagnosed  hypodense mass at the porta- hepatics with encasement of major vessels, with necrotic lymph nodes. This is highly suspicious for a malignant lesion.  MR/MRCP reviewed.  General surgery recommended GI consult for ERCP. Discussed with GI, recommended IR consult for PTC/PBD Keep n.p.o. after midnight IR for percutaneous transhepatic cholangiogram/percutaneous biliary drain and liver biopsy. Patient will need oncology consult after biopsy report for further management  For pruritus, use hydroxyzine plus H2 blocker  10/29/2022: See above documentation.  Follow cytology.  If cytology is not diagnostic, further workup as documented by the GI team. 10/30/2022: Bilirubin is down.  AST and ALT are down.  Alkaline phosphatase is down.  Follow cytology.  GI input is appreciated.  According to the daughter, patient may not be interested in chemotherapy or any form of cancer treatment.  Will continue to discuss with patient. 10/31/2022: Further management as per GI team.  Repeat lab work sent.  Supportive care.   Essential hypertension Blood pressure is soft Home meds amlodipine, Coreg, HCTZ, irbesartan Continued Coreg today with holding parameters Skip the dose of amlodipine, HCTZ and irbesartan, will resume tomorrow a.m. with holding parameters, Monitor BP and titrate medications accordingly If blood pressure remains soft then DC some medications and decrease the dose of Coreg. 10/31/2022: Blood pressure is controlled.    GERD (gastroesophageal reflux disease) Patient denies active symptoms and has not been taking medications. Started Pepcid twice daily  Hypokalemia: -KCl 40 mEq every 4 hours x 2 doses.   Body mass index is 42.43 kg/m.  Interventions:  Diet: Regular diet DVT Prophylaxis: Subcutaneous Lovenox and  SCD before PTC/PBD insertion   Advance goals of care discussion: DNR  Family Communication: family was present at bedside, at the time of interview.  The pt provided permission to  discuss medical plan with the family. Opportunity was given to ask question and all questions were answered satisfactorily.   Disposition:  Pt is from Home, admitted with hepatobiliary mass, most likely cholangiocarcinoma, needs PTC/PBD, liver biopsy which needs to be done by IR, patient will need oncology consult, which precludes a safe discharge. Discharge to Home, when stable.  Subjective:  -Reports poor appetite today. -No itching.  Physical Exam: General: Patient is awake and alert.  Not in any distress.   HEENT: Patient is pale and jaundiced.   Neck: Supple.  JVD is difficult to assess.    Cardiovascular: S1 and S2   Respiratory: Clear to auscultation.   Abdomen: Obese, soft and nontender.   Extremities: No lower extremity edema.   Neurologic: Awake and alert.  Patient moves all extremities. Vitals:   10/30/22 0620 10/30/22 0936 10/30/22 2135 10/31/22 0534  BP: 131/67 129/74 137/67 (!) 142/80  Pulse: (!) 52 (!) 55 (!) 58 62  Resp: 18 20 20 20   Temp: 97.8 F (36.6 C) 98.3 F (36.8 C) 97.8 F (36.6 C) 98.7 F (37.1 C)  TempSrc: Oral Oral Oral Oral  SpO2: 96% 97% 98% 96%  Weight:      Height:        Intake/Output Summary (Last 24 hours) at 10/31/2022 1251 Last data filed at 10/31/2022 0851 Gross per 24 hour  Intake 1825.32 ml  Output 2100 ml  Net -274.68 ml   Filed Weights   10/27/22 1614 10/29/22 1232  Weight: 105.2 kg 105.2 kg     CBC: Recent Labs  Lab 10/25/22 1613 10/27/22 1641 10/28/22 0130 10/30/22 0527  WBC 7.4 6.3 5.7 6.4  NEUTROABS 3.6  --   --   --   HGB 13.7 13.1 11.9* 11.5*  HCT 43.2 40.0 35.7* 35.4*  MCV 73.5* 71.3* 70.8* 71.4*  PLT 230.0 241 215 232   Basic Metabolic Panel: Recent Labs  Lab 10/25/22 1613 10/27/22 1641 10/28/22 0130 10/30/22 0527  NA 137 132*  --  133*  K 3.3* 3.3*  --  3.4*  CL 95* 94*  --  98  CO2 29 25  --  26  GLUCOSE 110* 116*  --  132*  BUN 11 10  --  16  CREATININE 0.68 0.47 0.47 0.87  CALCIUM 9.8 9.4   --  8.5*    Studies: No results found.  Scheduled Meds:  amLODipine  5 mg Oral Daily   carvedilol  25 mg Oral BID   enoxaparin (LOVENOX) injection  40 mg Subcutaneous QPM   famotidine  20 mg Oral BID   hydrochlorothiazide  25 mg Oral Daily   hydrOXYzine  10 mg Oral TID   irbesartan  300 mg Oral Daily   senna  1 tablet Oral BID   Continuous Infusions:  dextrose 5 % and 0.45 % NaCl 75 mL/hr at 10/30/22 2310   PRN Meds: acetaminophen **OR** acetaminophen, traZODone  Time spent: 35 minutes  Author: Modena Morrow, MD  [facility] 10/31/2022 12:51 PM  To reach On-call, see care teams to locate the attending and reach out to them via [email or web address]. If 7PM-7AM, please contact night-coverage If you still have difficulty reaching the attending provider, please page the [facility] (Director on Call) for [facility] on amion for assistance.

## 2022-11-01 DIAGNOSIS — R935 Abnormal findings on diagnostic imaging of other abdominal regions, including retroperitoneum: Secondary | ICD-10-CM

## 2022-11-01 DIAGNOSIS — R7989 Other specified abnormal findings of blood chemistry: Secondary | ICD-10-CM

## 2022-11-01 DIAGNOSIS — K831 Obstruction of bile duct: Secondary | ICD-10-CM

## 2022-11-01 DIAGNOSIS — R160 Hepatomegaly, not elsewhere classified: Secondary | ICD-10-CM | POA: Diagnosis not present

## 2022-11-01 LAB — CBC WITH DIFFERENTIAL/PLATELET
Abs Immature Granulocytes: 0.07 10*3/uL (ref 0.00–0.07)
Basophils Absolute: 0 10*3/uL (ref 0.0–0.1)
Basophils Relative: 0 %
Eosinophils Absolute: 0.2 10*3/uL (ref 0.0–0.5)
Eosinophils Relative: 3 %
HCT: 35 % — ABNORMAL LOW (ref 36.0–46.0)
Hemoglobin: 11.4 g/dL — ABNORMAL LOW (ref 12.0–15.0)
Immature Granulocytes: 1 %
Lymphocytes Relative: 31 %
Lymphs Abs: 2.4 10*3/uL (ref 0.7–4.0)
MCH: 23.3 pg — ABNORMAL LOW (ref 26.0–34.0)
MCHC: 32.6 g/dL (ref 30.0–36.0)
MCV: 71.6 fL — ABNORMAL LOW (ref 80.0–100.0)
Monocytes Absolute: 0.7 10*3/uL (ref 0.1–1.0)
Monocytes Relative: 9 %
Neutro Abs: 4.3 10*3/uL (ref 1.7–7.7)
Neutrophils Relative %: 56 %
Platelets: 277 10*3/uL (ref 150–400)
RBC: 4.89 MIL/uL (ref 3.87–5.11)
RDW: 18.6 % — ABNORMAL HIGH (ref 11.5–15.5)
WBC: 7.8 10*3/uL (ref 4.0–10.5)
nRBC: 0 % (ref 0.0–0.2)

## 2022-11-01 LAB — COMPREHENSIVE METABOLIC PANEL
ALT: 118 U/L — ABNORMAL HIGH (ref 0–44)
AST: 80 U/L — ABNORMAL HIGH (ref 15–41)
Albumin: 2.7 g/dL — ABNORMAL LOW (ref 3.5–5.0)
Alkaline Phosphatase: 253 U/L — ABNORMAL HIGH (ref 38–126)
Anion gap: 8 (ref 5–15)
BUN: 10 mg/dL (ref 8–23)
CO2: 27 mmol/L (ref 22–32)
Calcium: 8.6 mg/dL — ABNORMAL LOW (ref 8.9–10.3)
Chloride: 97 mmol/L — ABNORMAL LOW (ref 98–111)
Creatinine, Ser: 0.69 mg/dL (ref 0.44–1.00)
GFR, Estimated: >60 mL/min (ref >60)
Glucose, Bld: 103 mg/dL — ABNORMAL HIGH (ref 70–99)
Potassium: 3.7 mmol/L (ref 3.5–5.1)
Sodium: 132 mmol/L — ABNORMAL LOW (ref 135–145)
Total Bilirubin: 8.7 mg/dL — ABNORMAL HIGH (ref 0.3–1.2)
Total Protein: 6.4 g/dL — ABNORMAL LOW (ref 6.5–8.1)

## 2022-11-01 LAB — MAGNESIUM: Magnesium: 2 mg/dL (ref 1.7–2.4)

## 2022-11-01 LAB — PHOSPHORUS: Phosphorus: 3 mg/dL (ref 2.5–4.6)

## 2022-11-01 LAB — CANCER ANTIGEN 19-9: CA 19-9: 4527 U/mL — ABNORMAL HIGH (ref 0–35)

## 2022-11-01 NOTE — Anesthesia Postprocedure Evaluation (Signed)
Anesthesia Post Note  Patient: Carolyn Fowler  Procedure(s) Performed: ENDOSCOPIC RETROGRADE CHOLANGIOPANCREATOGRAPHY (ERCP) WITH PROPOFOL SPHINCTEROTOMY BILIARY STENT PLACEMENT BILIARY BRUSHING     Patient location during evaluation: PACU Anesthesia Type: General Level of consciousness: awake and alert Pain management: pain level controlled Vital Signs Assessment: post-procedure vital signs reviewed and stable Respiratory status: spontaneous breathing, nonlabored ventilation, respiratory function stable and patient connected to nasal cannula oxygen Cardiovascular status: blood pressure returned to baseline and stable Postop Assessment: no apparent nausea or vomiting Anesthetic complications: no   No notable events documented.  Last Vitals:  Vitals:   11/01/22 0639 11/01/22 1356  BP: 126/74 124/73  Pulse: 65 66  Resp: 16 17  Temp: 37.3 C 36.8 C  SpO2: 93% 96%    Last Pain:  Vitals:   11/01/22 0857  TempSrc:   PainSc: 0-No pain                 [name] S

## 2022-11-01 NOTE — Progress Notes (Signed)
[facility] Progress Note  Patient: Carolyn Fowler    ZOX:[id number]  DOA: 10/27/2022     Date of Service: the patient was seen and examined on 11/01/2022  Chief Complaint  Patient presents with   Liver problem   Brief hospital course: Patient is a 77 year old female with past medical history significant for hypertension, LVH and chronic low back pain.  Patient was admitted with 1 month history of itching, 10-day history of jaundice, worsening lethargy and poor appetite.  No prior h/o liver disease or pancreatic disease.  Patient was referred to the hospital for further evaluation and management of painless jaundice.  Abnormal liver functions with elevated total and direct bilirubin, CT scan revealed ill defined hypdense porta hepatic hypodense mass 5.8 x 4.7 cm with encasement of hepatic portal veings, hepatic arteries, common bile duct with assoicated cholecystitis.  CT also revealed large necrotic porta hepatic lymph nodes.  Initial lab work revealed potassium of 3.3, Alk phos 257, AST 175, ALT 245, NH3 38 T Bili 19.5, D. Bili 12.8, INR 1.1.  Findings were concerning for cholangiocarcinoma.  Patient is undergoing ERCP.  Stricture of the common hepatic duct was noted at the hilum.  Stent was placed to the right hepatic duct stenosis.  However, on the left side, the wire could not pass the area of the hilum.  GI input is appreciated.  Subsequent lab work has revealed improving LFTs, and lowering of the bilirubin.  Cytology result is pending.  Further management will depend on hospital course.  Patient is not particularly keen on chemotherapy i.e. if cytology comes back as malignancy.  10/31/2022: Vitals are stable.  No new labs visualized.  Will order new labs.  Patient reports poor appetite today.  Seen alongside 2 daughters and sons, as well as, other family members.  11/01/2022: Patient seen alongside 2 daughters.  Cytology is positive for malignancy (adenocarcinoma).  Patient remains  adamant that she would not pursue treatment.  After discussing with patient and 2 daughters, will consult oncology team to educate patient better.  I have discussed this with Dr. Arlis Porta, local oncologist.    Assessment and Plan: Hepatobiliary mass: -Concerning for cholangiocarcinoma.   -Patient with newly diagnosed hypodense mass at the porta- hepatics with encasement of major vessels, with necrotic lymph nodes. This is highly suspicious for a malignant lesion.  MR/MRCP reviewed.  General surgery recommended GI consult for ERCP. Discussed with GI, recommended IR consult for PTC/PBD Keep n.p.o. after midnight IR for percutaneous transhepatic cholangiogram/percutaneous biliary drain and liver biopsy. Patient will need oncology consult after biopsy report for further management  For pruritus, use hydroxyzine plus H2 blocker  10/29/2022: See above documentation.  Follow cytology.  If cytology is not diagnostic, further workup as documented by the GI team. 10/30/2022: Bilirubin is down.  AST and ALT are down.  Alkaline phosphatase is down.  Follow cytology.  GI input is appreciated.  According to the daughter, patient may not be interested in chemotherapy or any form of cancer treatment.  Will continue to discuss with patient. 10/31/2022: Further management as per GI team.  Repeat lab work sent.  11/01/2022: Likely cholangiocarcinoma.  Oncology team consulted.  Cytology was positive for malignancy (adenocarcinoma).   Essential hypertension Blood pressure is soft Home meds amlodipine, Coreg, HCTZ, irbesartan Continued Coreg today with holding parameters Skip the dose of amlodipine, HCTZ and irbesartan, will resume tomorrow a.m. with holding parameters, Monitor BP and titrate medications accordingly If blood pressure remains soft then DC some  medications and decrease the dose of Coreg. 11/01/2022: Blood pressure is controlled.    GERD (gastroesophageal reflux disease) Patient denies  active symptoms and has not been taking medications. Started Pepcid twice daily  Hypokalemia: -Corrected. -Potassium is 3.7 today.   Body mass index is 42.43 kg/m.  Interventions:  Diet: Regular diet DVT Prophylaxis: Subcutaneous Lovenox and SCD before PTC/PBD insertion   Advance goals of care discussion: DNR  Family Communication: family was present at bedside, at the time of interview.  The pt provided permission to discuss medical plan with the family. Opportunity was given to ask question and all questions were answered satisfactorily.   Disposition:  Pt is from Home, admitted with hepatobiliary mass, most likely cholangiocarcinoma, needs PTC/PBD, liver biopsy which needs to be done by IR, patient will need oncology consult, which precludes a safe discharge. Discharge to Home, when stable.  Subjective:  -No new complaints. -No itching.  Physical Exam: General: Patient is awake and alert.  Not in any distress.   HEENT: Patient is pale and jaundiced.   Neck: Supple.  JVD is difficult to assess.    Cardiovascular: S1 and S2   Respiratory: Clear to auscultation.   Abdomen: Obese, soft and nontender.   Extremities: No lower extremity edema.   Neurologic: Awake and alert.  Patient moves all extremities. Vitals:   10/31/22 1320 10/31/22 2029 11/01/22 0639 11/01/22 1356  BP: 137/73 124/73 126/74 124/73  Pulse: 61 69 65 66  Resp:  17 16 17   Temp: 98.6 F (37 C) 99.2 F (37.3 C) 99.2 F (37.3 C) 98.3 F (36.8 C)  TempSrc: Oral Oral Oral   SpO2: 96% 100% 93% 96%  Weight:      Height:        Intake/Output Summary (Last 24 hours) at 11/01/2022 1658 Last data filed at 11/01/2022 1400 Gross per 24 hour  Intake 2590.27 ml  Output 1100 ml  Net 1490.27 ml   Filed Weights   10/27/22 1614 10/29/22 1232  Weight: 105.2 kg 105.2 kg     CBC: Recent Labs  Lab 10/27/22 1641 10/28/22 0130 10/30/22 0527 10/31/22 1311 11/01/22 0518  WBC 6.3 5.7 6.4 8.0 7.8  NEUTROABS   --   --   --  4.7 4.3  HGB 13.1 11.9* 11.5* 11.3* 11.4*  HCT 40.0 35.7* 35.4* 34.9* 35.0*  MCV 71.3* 70.8* 71.4* 72.1* 71.6*  PLT 241 215 232 257 277   Basic Metabolic Panel: Recent Labs  Lab 10/27/22 1641 10/28/22 0130 10/30/22 0527 10/31/22 1311 11/01/22 0518  NA 132*  --  133* 136 132*  K 3.3*  --  3.4* 3.1* 3.7  CL 94*  --  98 98 97*  CO2 25  --  26 26 27   GLUCOSE 116*  --  132* 92 103*  BUN 10  --  16 15 10   CREATININE 0.47 0.47 0.87 0.54 0.69  CALCIUM 9.4  --  8.5* 8.4* 8.6*  MG  --   --   --  2.0 2.0  PHOS  --   --   --  2.9 3.0    Studies: No results found.  Scheduled Meds:  amLODipine  5 mg Oral Daily   carvedilol  25 mg Oral BID   enoxaparin (LOVENOX) injection  40 mg Subcutaneous QPM   famotidine  20 mg Oral BID   hydrochlorothiazide  25 mg Oral Daily   hydrOXYzine  10 mg Oral TID   irbesartan  300 mg Oral Daily  senna  1 tablet Oral BID   Continuous Infusions:  dextrose 5 % and 0.45 % NaCl 75 mL/hr at 11/01/22 1610   PRN Meds: acetaminophen **OR** acetaminophen, traZODone  Time spent: 35 minutes  Author: Modena Morrow, MD  [facility] 11/01/2022 4:58 PM  To reach On-call, see care teams to locate the attending and reach out to them via [email or web address]. If 7PM-7AM, please contact night-coverage If you still have difficulty reaching the attending provider, please page the [facility] (Director on Call) for [facility] on amion for assistance.

## 2022-11-01 NOTE — Progress Notes (Addendum)
Patient ID: Carolyn Fowler, female   DOB: Aug 05, 1945, 77 y.o.   MRN: [id number]    Progress Note   Subjective  Day # 5 CC;Jaundice biliary obstruction  MRI/MRCP-poorly defined infiltrative lesion in the hepatic hilum highly concerning for probable cholangiocarcinoma, estimated to measure 4.6 x 4.5 x 3.1 cm and encases hilar structures causing severe narrowing of the proximal portal vein which remains patent at present, gallbladder is distended gallbladder wall appears thickened and edematous with gallstones, no pancreatic mass identified-there is also metastatic lymphadenopathy noted in the upper abdomen and retroperitoneum and 2 tiny lesions in the liver which likely represent intrahepatic metastases.  ERCP 10/29/2022-hilar obstruction 7 French 9 cm plastic stent placed to right system, cytology done  Cytology pending CA 19-9 pending  WBC 8.0/hemoglobin 11.3/hematocrit 34.9 Potassium 3.7 T. bili 8.7/alk phos 253/AST 80/ALT 118-stable  Patient is resting comfortably, her son is at bedside, she denies any abdominal pain and says she has not had pain through her entire course, no nausea or vomiting appetite is improving, pruritus has decreased   Objective   Vital signs in last 24 hours: Temp:  [98.6 F (37 C)-99.2 F (37.3 C)] 99.2 F (37.3 C) (08/26 0639) Pulse Rate:  [61-69] 65 (08/26 0639) Resp:  [16-17] 16 (08/26 0639) BP: (124-137)/(73-74) 126/74 (08/26 0639) SpO2:  [93 %-100 %] 93 % (08/26 0639) Last BM Date : 10/30/22 General: Elderly African-American female in NAD-icteric Heart:  Regular rate and rhythm; no murmurs Lungs: Respirations even and unlabored, lungs CTA bilaterally Abdomen: Obese, soft, nontender no palpable mass or hepatosplenomegaly. Normal bowel sounds. Extremities:  Without edema. Neurologic:  Alert and oriented,  grossly normal neurologically. Psych:  Cooperative. Normal mood and affect.  Intake/Output from previous day: 08/25 0701 - 08/26 0700 In:  2545.7 [P.O.:1010; I.V.:1535.7] Out: 1100 [Urine:1100] Intake/Output this shift: No intake/output data recorded.  Lab Results: Recent Labs    10/30/22 0527 10/31/22 1311 11/01/22 0518  WBC 6.4 8.0 7.8  HGB 11.5* 11.3* 11.4*  HCT 35.4* 34.9* 35.0*  PLT 232 257 277   BMET Recent Labs    10/30/22 0527 10/31/22 1311 11/01/22 0518  NA 133* 136 132*  K 3.4* 3.1* 3.7  CL 98 98 97*  CO2 26 26 27   GLUCOSE 132* 92 103*  BUN 16 15 10   CREATININE 0.87 0.54 0.69  CALCIUM 8.5* 8.4* 8.6*   LFT Recent Labs    11/01/22 0518  PROT 6.4*  ALBUMIN 2.7*  AST 80*  ALT 118*  ALKPHOS 253*  BILITOT 8.7*   PT/INR No results for input(s): "LABPROT", "INR" in the last 72 hours.       Assessment / Plan:    #6 77 year old African-American female presenting with jaundice, anorexia, pruritus, with imaging as outlined above very concerning for cholangiocarcinoma, metastatic  She underwent ERCP with stent placement to the right system on 10/29/2022.  Brushings were done  She has had significant improvement in bilirubin, which now seems to be plateauing  Cytology is pending  #2 hypertension #3.  GERD   Plan; we will communicate with pathology this morning, try to expedite her cytology Repeat LFTs in a.m. If cytology is negative, will need to consider EUS with biopsy, versus IR biopsy.  Likewise if LFTs plateau, she may need percutaneous drain to the left system.  I discussed the findings thus far and potential plans as above, patient is appreciative and in agreement.     Principal Problem:   Hypodense mass of liver Active Problems:  Essential hypertension   GERD (gastroesophageal reflux disease)   Obstructive jaundice     LOS: 5 days   Amy EsterwoodPA-C  11/01/2022, 8:40 AM  GI ATTENDING  Interval history and data reviewed.  Case reviewed with Dr. Leone Payor.  Agree with interval progress note as outlined above.  The patient is comfortable this afternoon.  Her  daughter is in the room.  Her pruritus has resolved.  Still with dark urine.  Eating without issues.  Ambulating.  Cytology pending.  I reviewed with them the nature of her problem and the plan as outlined above.  I also spoke with our advanced therapeutic endoscopist regarding this case.  Will continue to follow and monitor liver tests.  Wilhemina Bonito. Eda Keys., M.D. [facility] Division of Gastroenterology

## 2022-11-01 NOTE — Progress Notes (Signed)
Mobility Specialist - Progress Note   11/01/22 1223  Mobility  Activity Ambulated with assistance in hallway  Level of Assistance Standby assist, set-up cues, supervision of patient - no hands on  Assistive Device Front wheel walker  Distance Ambulated (ft) 340 ft  Range of Motion/Exercises Active  Activity Response Tolerated well  Mobility Referral Yes  $Mobility charge 1 Mobility  Mobility Specialist Start Time (ACUTE ONLY) 1210  Mobility Specialist Stop Time (ACUTE ONLY) 1223  Mobility Specialist Time Calculation (min) (ACUTE ONLY) 13 min   Pt received in bed and agreed to mobility, had no issues throughout session, returned to bed with all needs met.  Marilynne Halsted Mobility Specialist

## 2022-11-01 NOTE — Plan of Care (Signed)
  Problem: Education: Goal: Knowledge of General Education information will improve Description: Including pain rating scale, medication(s)/side effects and non-pharmacologic comfort measures Outcome: Progressing   Problem: Coping: Goal: Level of anxiety will decrease Outcome: Progressing   

## 2022-11-01 NOTE — Care Management Important Message (Signed)
Important Message  Patient Details IM Letter given. Name: Carolyn Fowler MRN: [id number] Date of Birth: [birth year]   Medicare Important Message Given:  Yes     Caren Macadam 11/01/2022, 2:22 PM

## 2022-11-02 ENCOUNTER — Other Ambulatory Visit: Payer: Self-pay | Admitting: *Deleted

## 2022-11-02 DIAGNOSIS — R7989 Other specified abnormal findings of blood chemistry: Secondary | ICD-10-CM | POA: Diagnosis not present

## 2022-11-02 DIAGNOSIS — R160 Hepatomegaly, not elsewhere classified: Secondary | ICD-10-CM

## 2022-11-02 DIAGNOSIS — R935 Abnormal findings on diagnostic imaging of other abdominal regions, including retroperitoneum: Secondary | ICD-10-CM | POA: Diagnosis not present

## 2022-11-02 DIAGNOSIS — K831 Obstruction of bile duct: Secondary | ICD-10-CM | POA: Diagnosis not present

## 2022-11-02 LAB — COMPREHENSIVE METABOLIC PANEL
ALT: 110 U/L — ABNORMAL HIGH (ref 0–44)
AST: 91 U/L — ABNORMAL HIGH (ref 15–41)
Albumin: 2.5 g/dL — ABNORMAL LOW (ref 3.5–5.0)
Alkaline Phosphatase: 283 U/L — ABNORMAL HIGH (ref 38–126)
Anion gap: 8 (ref 5–15)
BUN: 9 mg/dL (ref 8–23)
CO2: 28 mmol/L (ref 22–32)
Calcium: 8.6 mg/dL — ABNORMAL LOW (ref 8.9–10.3)
Chloride: 98 mmol/L (ref 98–111)
Creatinine, Ser: 0.57 mg/dL (ref 0.44–1.00)
GFR, Estimated: >60 mL/min (ref >60)
Glucose, Bld: 100 mg/dL — ABNORMAL HIGH (ref 70–99)
Potassium: 3.7 mmol/L (ref 3.5–5.1)
Sodium: 134 mmol/L — ABNORMAL LOW (ref 135–145)
Total Bilirubin: 8.1 mg/dL — ABNORMAL HIGH (ref 0.3–1.2)
Total Protein: 6.3 g/dL — ABNORMAL LOW (ref 6.5–8.1)

## 2022-11-02 LAB — CBC WITH DIFFERENTIAL/PLATELET
Abs Immature Granulocytes: 0.08 10*3/uL — ABNORMAL HIGH (ref 0.00–0.07)
Basophils Absolute: 0.1 10*3/uL (ref 0.0–0.1)
Basophils Relative: 1 %
Eosinophils Absolute: 0.3 10*3/uL (ref 0.0–0.5)
Eosinophils Relative: 4 %
HCT: 35.7 % — ABNORMAL LOW (ref 36.0–46.0)
Hemoglobin: 11.5 g/dL — ABNORMAL LOW (ref 12.0–15.0)
Immature Granulocytes: 1 %
Lymphocytes Relative: 34 %
Lymphs Abs: 2.6 10*3/uL (ref 0.7–4.0)
MCH: 23.2 pg — ABNORMAL LOW (ref 26.0–34.0)
MCHC: 32.2 g/dL (ref 30.0–36.0)
MCV: 72.1 fL — ABNORMAL LOW (ref 80.0–100.0)
Monocytes Absolute: 0.7 10*3/uL (ref 0.1–1.0)
Monocytes Relative: 9 %
Neutro Abs: 4 10*3/uL (ref 1.7–7.7)
Neutrophils Relative %: 51 %
Platelets: 264 10*3/uL (ref 150–400)
RBC: 4.95 MIL/uL (ref 3.87–5.11)
RDW: 18.6 % — ABNORMAL HIGH (ref 11.5–15.5)
WBC: 7.6 10*3/uL (ref 4.0–10.5)
nRBC: 0 % (ref 0.0–0.2)

## 2022-11-02 LAB — MAGNESIUM: Magnesium: 2 mg/dL (ref 1.7–2.4)

## 2022-11-02 NOTE — Progress Notes (Addendum)
Patient ID: Carolyn Fowler, female   DOB: Nov 14, 1945, 77 y.o.   MRN: [id number]    Progress Note   Subjective  Day #6 CC; jaundice, biliary obstruction  Cytology returned yesterday consistent with adenocarcinoma CA 19-9- 4527  Today-WBC 7.6/hemoglobin 11.5 Potassium 3.7 T. bili 8.1/alk phos 283/AST 91/ALT 110-trending down  Patient relates that Dr.Sherrill came to see her earlier this morning and plans to return for full consult tomorrow She is comfortable, denies any abdominal pain, has been able to eat, pruritus much better Says her urine looked a little darker today She relates that she is not inclined to pursue any chemotherapy, but would like to have medicines available for pain and anxiety etc. if she needs them.    Objective   Vital signs in last 24 hours: Temp:  [98.3 F (36.8 C)-99.8 F (37.7 C)] 99.6 F (37.6 C) (08/27 0637) Pulse Rate:  [66-71] 71 (08/27 0637) Resp:  [17-18] 18 (08/27 0637) BP: (103-178)/(73-101) 178/101 (08/27 0637) SpO2:  [96 %-99 %] 99 % (08/27 0637) Last BM Date : 10/31/22 General:    Elderly African-American female in NAD, icteric Heart:  Regular rate and rhythm; no murmurs Lungs: Respirations even and unlabored, lungs CTA bilaterally Abdomen:  Soft, obese, nontender , nondistended. Normal bowel sounds. Extremities:  Without edema. Neurologic:  Alert and oriented,  grossly normal neurologically. Psych:  Cooperative. Normal mood and affect.  Intake/Output from previous day: 08/26 0701 - 08/27 0700 In: 3133.1 [P.O.:1440; I.V.:1693.1] Out: 1600 [Urine:1600] Intake/Output this shift: Total I/O In: 240 [P.O.:240] Out: -   Lab Results: Recent Labs    10/31/22 1311 11/01/22 0518 11/02/22 0435  WBC 8.0 7.8 7.6  HGB 11.3* 11.4* 11.5*  HCT 34.9* 35.0* 35.7*  PLT 257 277 264   BMET Recent Labs    10/31/22 1311 11/01/22 0518 11/02/22 0435  NA 136 132* 134*  K 3.1* 3.7 3.7  CL 98 97* 98  CO2 26 27 28   GLUCOSE 92 103* 100*   BUN 15 10 9   CREATININE 0.54 0.69 0.57  CALCIUM 8.4* 8.6* 8.6*   LFT Recent Labs    11/02/22 0435  PROT 6.3*  ALBUMIN 2.5*  AST 91*  ALT 110*  ALKPHOS 283*  BILITOT 8.1*   PT/INR No results for input(s): "LABPROT", "INR" in the last 72 hours.      Assessment / Plan:    #76 77 year old African-American female with new diagnosis of a cholangiocarcinoma, metastatic by MRI with lymphadenopathy in the upper abdomen and retroperitoneum severe narrowing of numerous vascular structures possible tiny intrahepatic metastases.  She is stable status post ERCP with biliary stent placement to the right intrahepatic system Bilirubin is gradually continuing to trend down  Plan; Oncology consult has been initiated, Dr. Truett Perna will meet with her formally tomorrow. Patient was encouraged to hear all of her options prior to making definite decisions, but she is leaning toward more of a palliative approach which is certainly reasonable  Continue to trend LFTs-no plans for any further procedures at present, would follow LFTs over the next couple of weeks, PTC per IR can be done in the future if needed.     Principal Problem:   Hypodense mass of liver Active Problems:   Essential hypertension   GERD (gastroesophageal reflux disease)   Obstructive jaundice     LOS: 6 days   Amy EsterwoodPA-C  11/02/2022, 10:57 AM  GI ATTENDING  Interval history data reviewed.  Patient seen and examined.  Agree with interval progress  note as outlined above.  Clinically stable.  Liver test continue to improve.  Seen by oncology.  Agree with ongoing monitoring of liver test.  It may take a bit of time for the bilirubin to normalize, given the starting point of 19.  Nothing further to add from a GI perspective.  Will sign off.  Wilhemina Bonito. Eda Keys., M.D. [facility] Division of Gastroenterology

## 2022-11-02 NOTE — Progress Notes (Signed)
Mobility Specialist - Progress Note   11/02/22 0901  Mobility  Activity Ambulated with assistance in hallway  Level of Assistance Standby assist, set-up cues, supervision of patient - no hands on  Assistive Device Front wheel walker  Distance Ambulated (ft) 250 ft  Activity Response Tolerated well  Mobility Referral Yes  $Mobility charge 1 Mobility  Mobility Specialist Start Time (ACUTE ONLY) 0847  Mobility Specialist Stop Time (ACUTE ONLY) 0900  Mobility Specialist Time Calculation (min) (ACUTE ONLY) 13 min   Pt received in bed and agreeable to mobility. No complaints during session. Pt to EOB after session with all needs met.     [facility]

## 2022-11-02 NOTE — Plan of Care (Signed)
  Problem: Education: Goal: Knowledge of General Education information will improve Description Including pain rating scale, medication(s)/side effects and non-pharmacologic comfort measures Outcome: Progressing   Problem: Health Behavior/Discharge Planning: Goal: Ability to manage health-related needs will improve Outcome: Progressing   

## 2022-11-02 NOTE — Progress Notes (Signed)
[facility] Progress Note  Patient: Carolyn Fowler    AVW:[id number]  DOA: 10/27/2022     Date of Service: the patient was seen and examined on 11/02/2022  Chief Complaint  Patient presents with   Liver problem   Brief hospital course: Patient is a 77 year old female with past medical history significant for hypertension, LVH and chronic low back pain.  Patient was admitted with 1 month history of itching, 10-day history of jaundice, worsening lethargy and poor appetite.  No prior h/o liver disease or pancreatic disease.  Patient was referred to the hospital for further evaluation and management of painless jaundice.  Abnormal liver functions with elevated total and direct bilirubin, CT scan revealed ill defined hypdense porta hepatic hypodense mass 5.8 x 4.7 cm with encasement of hepatic portal veings, hepatic arteries, common bile duct with assoicated cholecystitis.  CT also revealed large necrotic porta hepatic lymph nodes.  Initial lab work revealed potassium of 3.3, Alk phos 257, AST 175, ALT 245, NH3 38 T Bili 19.5, D. Bili 12.8, INR 1.1.  Findings were concerning for cholangiocarcinoma.  Patient underwent ERCP, stricture of the common hepatic duct was noted at the hilum.  Stent was placed at the right hepatic duct.  However, stents could not be placed on the left hepatic duct.  GI input is appreciated.  Subsequent lab work has revealed improving LFTs, and lowering of the bilirubin, bili decrease has plateaued.  Cytology revealed adenocarcinoma (likely cholangiocarcinoma).  Oncology team has been consulted.  Patient is not particularly keen on chemotherapy or any aggressive management.  Patient and patient's daughter told me that oncology team plans to follow-up with patient tomorrow.  Palliative care has also been consulted.  Pursue disposition.      Assessment and Plan: Hepatobiliary mass: -Patient with newly diagnosed hypodense mass at the porta- hepatics with encasement of major  vessels, with necrotic lymph nodes. This is highly suspicious for a malignant lesion.  -MR/MRCP reviewed.  General surgery recommended GI consult for ERCP. -Patient  underwent ERCP, with stent placement to the right hepatic duct.  Left side could not be stented.  Biopsy/cytology revealed adenocarcinoma, likely cholangiocarcinoma.   -Patient is not particularly keen on aggressive management. -Palliative care team consulted. -Oncology team consulted. -Pursue disposition if no further GI workup.    Essential hypertension Blood pressure is controlled   GERD (gastroesophageal reflux disease) Patient denies active symptoms and has not been taking medications. Started Pepcid twice daily  Hypokalemia: -Corrected. -Potassium is 3.7 today.   Body mass index is 42.43 kg/m.  Interventions:  Diet: Regular diet DVT Prophylaxis: Subcutaneous Lovenox    Advance goals of care discussion: DNR  Family Communication: Daughter.     Disposition:  Pursue disposition. -Query discharge with hospice versus palliative care team follow-up.  Subjective:  -No new complaints. -No itching.  Physical Exam: General: Patient is awake and alert.  Not in any distress.   HEENT: Patient is pale and jaundiced.   Neck: Supple.  JVD is difficult to assess.    Cardiovascular: S1 and S2   Respiratory: Clear to auscultation.   Abdomen: Obese, soft and nontender.   Extremities: No lower extremity edema.   Neurologic: Awake and alert.  Patient moves all extremities. Vitals:   11/01/22 1356 11/01/22 2043 11/02/22 0637 11/02/22 1433  BP: 124/73 (!) 103/91 (!) 178/101 (!) 112/57  Pulse: 66 68 71 68  Resp: 17 18 18 16   Temp: 98.3 F (36.8 C) 99.8 F (37.7 C) 99.6 F (37.6  C) 99.2 F (37.3 C)  TempSrc:  Oral Oral Oral  SpO2: 96% 96% 99% 98%  Weight:      Height:        Intake/Output Summary (Last 24 hours) at 11/02/2022 1708 Last data filed at 11/02/2022 1530 Gross per 24 hour  Intake 3149.65 ml   Output 1900 ml  Net 1249.65 ml   Filed Weights   10/27/22 1614 10/29/22 1232  Weight: 105.2 kg 105.2 kg     CBC: Recent Labs  Lab 10/28/22 0130 10/30/22 0527 10/31/22 1311 11/01/22 0518 11/02/22 0435  WBC 5.7 6.4 8.0 7.8 7.6  NEUTROABS  --   --  4.7 4.3 4.0  HGB 11.9* 11.5* 11.3* 11.4* 11.5*  HCT 35.7* 35.4* 34.9* 35.0* 35.7*  MCV 70.8* 71.4* 72.1* 71.6* 72.1*  PLT 215 232 257 277 264   Basic Metabolic Panel: Recent Labs  Lab 10/27/22 1641 10/28/22 0130 10/30/22 0527 10/31/22 1311 11/01/22 0518 11/02/22 0435  NA 132*  --  133* 136 132* 134*  K 3.3*  --  3.4* 3.1* 3.7 3.7  CL 94*  --  98 98 97* 98  CO2 25  --  26 26 27 28   GLUCOSE 116*  --  132* 92 103* 100*  BUN 10  --  16 15 10 9   CREATININE 0.47 0.47 0.87 0.54 0.69 0.57  CALCIUM 9.4  --  8.5* 8.4* 8.6* 8.6*  MG  --   --   --  2.0 2.0 2.0  PHOS  --   --   --  2.9 3.0  --     Studies: No results found.  Scheduled Meds:  amLODipine  5 mg Oral Daily   carvedilol  25 mg Oral BID   enoxaparin (LOVENOX) injection  40 mg Subcutaneous QPM   famotidine  20 mg Oral BID   hydrochlorothiazide  25 mg Oral Daily   hydrOXYzine  10 mg Oral TID   irbesartan  300 mg Oral Daily   senna  1 tablet Oral BID   Continuous Infusions:  dextrose 5 % and 0.45 % NaCl 75 mL/hr at 11/02/22 0745   PRN Meds: acetaminophen **OR** acetaminophen, traZODone  Time spent: 35 minutes  Author: Modena Morrow, MD  [facility] 11/02/2022 5:08 PM  To reach On-call, see care teams to locate the attending and reach out to them via [email or web address]. If 7PM-7AM, please contact night-coverage If you still have difficulty reaching the attending provider, please page the [facility] (Director on Call) for [facility] on amion for assistance.

## 2022-11-02 NOTE — Progress Notes (Signed)
Brief discussion with the patient and her daughter.  Outpatient follow-up is scheduled at the Cancer center on 11/10/2022.  Please call oncology as needed.  I will check on her later this week if she remains in the hospital.

## 2022-11-02 NOTE — [facility] Oncology Navigator Note (Signed)
Oncology Discharge Planning Note  [facility] Address: [address], [place], [place] [address] Hours of Operation:  Lewayne Bunting, Monday - Friday  Clinic Contact Information:  [phone]) [phone]  Oncology Care Team: Medical Oncologist:  Truett Perna  Patient Details: Name:  Carolyn Fowler, Carolyn Fowler MRN:   [id number] DOB:   Jul 25, 1945 Reason for Current Admission: Hypodense mass of liver  Discharge Planning Narrative: Notification of admission received by inpatient team for Alyson Reedy.  Discharge follow-up appointments for oncology are current and available on the AVS and MyChart.   Upon discharge from the hospital, hematology/oncology's post discharge plan of care for the outpatient setting is: September 4,2024 at 1:25pm  [facility] [address] [place], [place] [address]  [phone]    Anishka Alwan will be called within two business days after discharge to review hematology/oncology's plan of care for full understanding.    Outpatient Oncology Specific Care Only: Oncology appointment transportation needs addressed?:  no Oncology medication management for symptom management addressed?:  no Chemo Alert Card reviewed?:  not applicable Immunotherapy Alert Card reviewed?:  not applicable

## 2022-11-03 ENCOUNTER — Inpatient Hospital Stay (HOSPITAL_COMMUNITY): Payer: Medicare PPO

## 2022-11-03 DIAGNOSIS — R160 Hepatomegaly, not elsewhere classified: Secondary | ICD-10-CM | POA: Diagnosis not present

## 2022-11-03 DIAGNOSIS — Z7189 Other specified counseling: Secondary | ICD-10-CM

## 2022-11-03 DIAGNOSIS — C221 Intrahepatic bile duct carcinoma: Secondary | ICD-10-CM | POA: Diagnosis not present

## 2022-11-03 DIAGNOSIS — C801 Malignant (primary) neoplasm, unspecified: Secondary | ICD-10-CM | POA: Diagnosis not present

## 2022-11-03 DIAGNOSIS — I10 Essential (primary) hypertension: Secondary | ICD-10-CM | POA: Diagnosis not present

## 2022-11-03 DIAGNOSIS — K831 Obstruction of bile duct: Secondary | ICD-10-CM | POA: Diagnosis not present

## 2022-11-03 DIAGNOSIS — Z515 Encounter for palliative care: Secondary | ICD-10-CM

## 2022-11-03 LAB — COMPREHENSIVE METABOLIC PANEL
ALT: 113 U/L — ABNORMAL HIGH (ref 0–44)
AST: 100 U/L — ABNORMAL HIGH (ref 15–41)
Albumin: 2.4 g/dL — ABNORMAL LOW (ref 3.5–5.0)
Alkaline Phosphatase: 286 U/L — ABNORMAL HIGH (ref 38–126)
Anion gap: 9 (ref 5–15)
BUN: 8 mg/dL (ref 8–23)
CO2: 28 mmol/L (ref 22–32)
Calcium: 8.9 mg/dL (ref 8.9–10.3)
Chloride: 101 mmol/L (ref 98–111)
Creatinine, Ser: 0.63 mg/dL (ref 0.44–1.00)
GFR, Estimated: >60 mL/min (ref >60)
Glucose, Bld: 109 mg/dL — ABNORMAL HIGH (ref 70–99)
Potassium: 3.5 mmol/L (ref 3.5–5.1)
Sodium: 138 mmol/L (ref 135–145)
Total Bilirubin: 8.3 mg/dL — ABNORMAL HIGH (ref 0.3–1.2)
Total Protein: 6 g/dL — ABNORMAL LOW (ref 6.5–8.1)

## 2022-11-03 LAB — CYTOLOGY - NON PAP

## 2022-11-03 NOTE — Plan of Care (Signed)

## 2022-11-03 NOTE — Progress Notes (Addendum)
PROGRESS NOTE    Carolyn Fowler  QIO:[id number] DOB: [birth year] DOA: 10/27/2022 PCP: Myrlene Broker, MD   Brief Narrative: Carolyn Fowler is a 77 y.o. female with a history of hypertension, left ventricular hypertrophy, chronic low back pain.  Patient presented secondary to progressive itching, jaundice, worsening lethargy and poor appetite.  CT imaging revealed an ill-defined hypodense porta hepatic hypodense mass measuring 5.8 x 4.7 cm with encasement of the hepatic portal veins, hepatic arteries, common bile duct with associated evidence of cholecystitis.  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  GI consulted and performed an ERCP and placement of a right hepatic duct stent.  Hepatic duct brushing revealed adenocarcinoma, likely cholangiocarcinoma.  Oncology consulted.  Patient deciding on more palliative/comfort measures approach but has ongoing discussions with oncology and palliative care.   Assessment and Plan:  Hepatobiliary adenocarcinoma Resulting biliary obstruction.  Gastroenterology was consulted and performed ERCP on 8/23 with placement of right hepatic duct stent.  Hepatic duct brushings revealed evidence of adenocarcinoma with concern for possible cholangiocarcinoma.  Medical oncology was consulted with discussions leaning more towards comfort measures/palliative care.  Palliative care is consulted and is following.  Obstruction improved with stent placement with LFTs appearing to have stabilized.  GI recommending consideration of PTC by IR at some point if necessary. -Follow-up medical oncology/palliative care discussions  Biliary obstruction Symptoms have improved since stent placement.  Fever Likely related to above. No evidence of acute infection, however. -Trend fever curve  Primary hypertension Patient is on carvedilol, amlodipine, and valsartan as an outpatient. -Continue amlodipine and carvedilol -Continue irbesartan (substituted for home  valsartan)  GERD -Continue Pepcid twice daily  Hypokalemia Mild.  Resolved with potassium supplementation.  Morbid obesity Estimated body mass index is 42.43 kg/m as calculated from the following:   Height as of this encounter: 5\' 2"  (1.575 m).   Weight as of this encounter: 105.2 kg.   DVT prophylaxis: Lovenox Code Status:   Code Status: DNR Family Communication: Two daughters at bedside Disposition Plan: Discharge home likely in 24 hours pending continued goals of care discussions.   Consultants:  [facility] Medical oncology Palliative care medicine  Procedures:  8/23: ERCP  Antimicrobials: Ciprofloxacin   Subjective: Patient reports her itching is significantly improved.  No abdominal pain.  Patient with a fever overnight with Tmax of 101.9 F.  Objective: BP 106/69 (BP Location: Right Arm)   Pulse 64   Temp 99.6 F (37.6 C) (Oral)   Resp 17   Ht 5\' 2"  (1.575 m)   Wt 105.2 kg   SpO2 98%   BMI 42.43 kg/m   Examination:  General exam: Appears calm and comfortable HEENT: No scleral icterus noted Respiratory system: Clear to auscultation. Respiratory effort normal. Cardiovascular system: S1 & S2 heard, RRR. No murmurs. Gastrointestinal system: Abdomen is nondistended, soft and nontender. Normal bowel sounds heard. Central nervous system: Alert and oriented. No focal neurological deficits. Musculoskeletal: No edema. No calf tenderness Psychiatry: Judgement and insight appear normal. Mood & affect appropriate.    Data Reviewed: I have personally reviewed following labs and imaging studies  CBC Lab Results  Component Value Date   WBC 7.6 11/02/2022   RBC 4.95 11/02/2022   HGB 11.5 (L) 11/02/2022   HCT 35.7 (L) 11/02/2022   MCV 72.1 (L) 11/02/2022   MCH 23.2 (L) 11/02/2022   PLT 264 11/02/2022   MCHC 32.2 11/02/2022   RDW 18.6 (H) 11/02/2022   LYMPHSABS 2.6 11/02/2022   MONOABS 0.7 11/02/2022  EOSABS 0.3 11/02/2022   BASOSABS 0.1  11/02/2022     Last metabolic panel Lab Results  Component Value Date   NA 138 11/03/2022   K 3.5 11/03/2022   CL 101 11/03/2022   CO2 28 11/03/2022   BUN 8 11/03/2022   CREATININE 0.63 11/03/2022   GLUCOSE 109 (H) 11/03/2022   GFRNONAA >60 11/03/2022   GFRAA >90 10/21/2011   CALCIUM 8.9 11/03/2022   PHOS 3.0 11/01/2022   PROT 6.0 (L) 11/03/2022   ALBUMIN 2.4 (L) 11/03/2022   BILITOT 8.3 (H) 11/03/2022   ALKPHOS 286 (H) 11/03/2022   AST 100 (H) 11/03/2022   ALT 113 (H) 11/03/2022   ANIONGAP 9 11/03/2022    GFR: Estimated Creatinine Clearance: 68.1 mL/min (by C-G formula based on SCr of 0.63 mg/dL).  No results found for this or any previous visit (from the past 240 hour(s)).    Radiology Studies: No results found.    LOS: 7 days    Jacquelin Hawking, MD [facility] 11/03/2022, 4:18 PM   If 7PM-7AM, please contact night-coverage [email or web address]

## 2022-11-03 NOTE — Consult Note (Signed)
Consultation Note Date: 11/03/2022   Patient Name: Carolyn Fowler  DOB: Jul 23, 1945  MRN: [id number]  Age / Sex: 77 y.o., female  PCP: Myrlene Broker, MD Referring Physician: Berton Mount, MD  Reason for Consultation:  symptom management, "Goals of caer. Cholangiocarcinoma. Patient seems not to be keen on Chemo etc. Oncology team consulted"  HPI/Patient Profile: 77 y.o. female  with past medical history of HTN admitted on 10/27/2022 with weight loss and elevated LFTs, progressively worsening jaundice, weakness- sent to ED by primary d/t elevated LFTs. CT revealed ill defined hypodense porta hepatic mass 5.8x4.7cm, encasement of hepatic portal vein, common bile duct, hepatic artery; partially necrotic hepatic lymph nodes, distended gall bladder. Concerns for cholangiocarcinoma. S/p ERCP with stent placement which has resulted in improvement of LFTs. Biopsy resulted in adenocarcinoma likely cholangiocarcinoma. Palliative consulted due to patient has expressed she is not interested in pursuing chemotherapy or other aggressive interventions. Oncology consulted and had brief discussion with patient and daughter with plans for followup at cancer center.   Primary Decision Maker PATIENT  Discussion: Chart reviewed including labs, progress notes, imaging from this and previous encounters.  Evaluated patient. She was awake and alert.  Introduced Palliative medicine.  Palliative medicine is specialized medical care for people living with serious illness. It focuses on providing relief from the symptoms and stress of a serious illness. The goal is to improve quality of life for both the patient and the family. Patient was visiting with her cousins and close friends- she wished to meet at a later time with her daughter present.     SUMMARY OF RECOMMENDATIONS -Continue current interventions -Plan to meet  with patient and her daughter tomorrow at 10am    Code Status/Advance Care Planning: DNR   Prognosis:   Unable to determine  Discharge Planning: To Be Determined  Primary Diagnoses: Present on Admission:  Essential hypertension  GERD (gastroesophageal reflux disease)  Hypodense mass of liver   Review of Systems  Physical Exam  Vital Signs: BP 106/69 (BP Location: Right Arm)   Pulse 64   Temp 99.6 F (37.6 C) (Oral)   Resp 17   Ht 5\' 2"  (1.575 m)   Wt 105.2 kg   SpO2 98%   BMI 42.43 kg/m  Pain Scale: 0-10   Pain Score: 0-No pain   SpO2: SpO2: 98 % O2 Device:SpO2: 98 % O2 Flow Rate: .O2 Flow Rate (L/min): 3 L/min  IO: Intake/output summary:  Intake/Output Summary (Last 24 hours) at 11/03/2022 1513 Last data filed at 11/03/2022 1458 Gross per 24 hour  Intake 3132.1 ml  Output 4000 ml  Net -867.9 ml    LBM: Last BM Date : 10/31/22 Baseline Weight: Weight: 105.2 kg Most recent weight: Weight: 105.2 kg       Thank you for this consult. Palliative medicine will continue to follow and assist as needed.  Time Total: 45 minutes Greater than 50%  of this time was spent counseling and coordinating care related to the above assessment and plan.  Signed  by: Ocie Bob, AGNP-C Palliative Medicine    Please contact Palliative Medicine Team phone at [phone] for questions and concerns.  For individual provider: See Loretha Stapler

## 2022-11-03 NOTE — Progress Notes (Signed)
   11/03/22 2328  Assess: MEWS Score  Temp (!) 101.6 F (38.7 C)  BP 131/68  MAP (mmHg) 85  Pulse Rate 74  Resp 20  SpO2 100 %  O2 Device Room Air  Assess: MEWS Score  MEWS Temp 2  MEWS Systolic 0  MEWS Pulse 0  MEWS RR 0  MEWS LOC 0  MEWS Score 2  MEWS Score Color Yellow  Assess: if the MEWS score is Yellow or Red  Were vital signs accurate and taken at a resting state? Yes  Does the patient meet 2 or more of the SIRS criteria? No  MEWS guidelines implemented  Yes, yellow  Treat  MEWS Interventions Considered administering scheduled or prn medications/treatments as ordered  Take Vital Signs  Increase Vital Sign Frequency  Yellow: Q2hr x1, continue Q4hrs until patient remains green for 12hrs  Escalate  MEWS: Escalate Yellow: Discuss with charge nurse and consider notifying provider and/or RRT  Notify: Charge Nurse/RN  Name of Charge Nurse/RN Notified Adrianna, RN  Provider Notification  Provider Name/Title Anthoney Harada, NP  Date Provider Notified 11/03/22  Time Provider Notified 2330  Method of Notification Page  Notification Reason Change in status  Provider response Evaluate remotely  Date of Provider Response 11/03/22  Time of Provider Response 2330  Assess: SIRS CRITERIA  SIRS Temperature  1  SIRS Pulse 0  SIRS Respirations  0  SIRS WBC 0  SIRS Score Sum  1

## 2022-11-03 NOTE — Hospital Course (Addendum)
Carolyn Fowler is a 77 y.o. female with a history of hypertension, left ventricular hypertrophy, chronic low back pain.  Patient presented secondary to progressive itching, jaundice, worsening lethargy and poor appetite.  CT imaging revealed an ill-defined hypodense porta hepatic hypodense mass measuring 5.8 x 4.7 cm with encasement of the hepatic portal veins, hepatic arteries, common bile duct with associated evidence of cholecystitis.  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  GI consulted and performed an ERCP and placement of a right hepatic duct stent.  Hepatic duct brushing revealed adenocarcinoma, likely cholangiocarcinoma.  Oncology consulted.  Patient decided on home with hospice care on discharge. Urine culture significant for E. Coli. No fevers prior to discharge. Home with antibiotics.

## 2022-11-03 NOTE — Progress Notes (Signed)
Mobility Specialist - Progress Note   11/03/22 1008  Mobility  Activity Ambulated with assistance in hallway;Ambulated with assistance to bathroom  Level of Assistance Standby assist, set-up cues, supervision of patient - no hands on  Assistive Device Front wheel walker  Distance Ambulated (ft) 500 ft  Activity Response Tolerated well  Mobility Referral Yes  $Mobility charge 1 Mobility  Mobility Specialist Start Time (ACUTE ONLY) [id number]  Mobility Specialist Stop Time (ACUTE ONLY) 1009  Mobility Specialist Time Calculation (min) (ACUTE ONLY) 33 min   Pt received in bed and agreeable to mobility. Prior to ambulating, pt requested assistance to bathroom. No complaints during session.  Pt to EOB after session with all needs met.    [facility]

## 2022-11-04 DIAGNOSIS — R160 Hepatomegaly, not elsewhere classified: Secondary | ICD-10-CM | POA: Diagnosis not present

## 2022-11-04 DIAGNOSIS — C220 Liver cell carcinoma: Secondary | ICD-10-CM

## 2022-11-04 DIAGNOSIS — D509 Iron deficiency anemia, unspecified: Secondary | ICD-10-CM

## 2022-11-04 DIAGNOSIS — C221 Intrahepatic bile duct carcinoma: Secondary | ICD-10-CM | POA: Diagnosis not present

## 2022-11-04 DIAGNOSIS — Z7189 Other specified counseling: Secondary | ICD-10-CM | POA: Diagnosis not present

## 2022-11-04 DIAGNOSIS — K831 Obstruction of bile duct: Secondary | ICD-10-CM | POA: Diagnosis not present

## 2022-11-04 DIAGNOSIS — C801 Malignant (primary) neoplasm, unspecified: Secondary | ICD-10-CM | POA: Diagnosis not present

## 2022-11-04 DIAGNOSIS — R509 Fever, unspecified: Secondary | ICD-10-CM

## 2022-11-04 DIAGNOSIS — I10 Essential (primary) hypertension: Secondary | ICD-10-CM | POA: Diagnosis not present

## 2022-11-04 LAB — HEPATIC FUNCTION PANEL
ALT: 116 U/L — ABNORMAL HIGH (ref 0–44)
AST: 104 U/L — ABNORMAL HIGH (ref 15–41)
Albumin: 2.5 g/dL — ABNORMAL LOW (ref 3.5–5.0)
Alkaline Phosphatase: 280 U/L — ABNORMAL HIGH (ref 38–126)
Bilirubin, Direct: 4.2 mg/dL — ABNORMAL HIGH (ref 0.0–0.2)
Indirect Bilirubin: 3.7 mg/dL — ABNORMAL HIGH (ref 0.3–0.9)
Total Bilirubin: 7.9 mg/dL — ABNORMAL HIGH (ref 0.3–1.2)
Total Protein: 6.3 g/dL — ABNORMAL LOW (ref 6.5–8.1)

## 2022-11-04 LAB — BASIC METABOLIC PANEL
Anion gap: 10 (ref 5–15)
BUN: 10 mg/dL (ref 8–23)
CO2: 25 mmol/L (ref 22–32)
Calcium: 8.2 mg/dL — ABNORMAL LOW (ref 8.9–10.3)
Chloride: 95 mmol/L — ABNORMAL LOW (ref 98–111)
Creatinine, Ser: 0.61 mg/dL (ref 0.44–1.00)
GFR, Estimated: >60 mL/min (ref >60)
Glucose, Bld: 104 mg/dL — ABNORMAL HIGH (ref 70–99)
Potassium: 3.3 mmol/L — ABNORMAL LOW (ref 3.5–5.1)
Sodium: 130 mmol/L — ABNORMAL LOW (ref 135–145)

## 2022-11-04 LAB — URINALYSIS, ROUTINE W REFLEX MICROSCOPIC
Glucose, UA: NEGATIVE mg/dL
Hgb urine dipstick: NEGATIVE
Ketones, ur: NEGATIVE mg/dL
Leukocytes,Ua: NEGATIVE
Nitrite: POSITIVE — AB
Protein, ur: NEGATIVE mg/dL
Specific Gravity, Urine: 1.016 (ref 1.005–1.030)
pH: 5 (ref 5.0–8.0)

## 2022-11-04 LAB — CBC WITH DIFFERENTIAL/PLATELET
Abs Immature Granulocytes: 0.1 10*3/uL — ABNORMAL HIGH (ref 0.00–0.07)
Basophils Absolute: 0 10*3/uL (ref 0.0–0.1)
Basophils Relative: 0 %
Eosinophils Absolute: 0.2 10*3/uL (ref 0.0–0.5)
Eosinophils Relative: 3 %
HCT: 35.1 % — ABNORMAL LOW (ref 36.0–46.0)
Hemoglobin: 11 g/dL — ABNORMAL LOW (ref 12.0–15.0)
Immature Granulocytes: 2 %
Lymphocytes Relative: 23 %
Lymphs Abs: 1.5 10*3/uL (ref 0.7–4.0)
MCH: 23.2 pg — ABNORMAL LOW (ref 26.0–34.0)
MCHC: 31.3 g/dL (ref 30.0–36.0)
MCV: 74.1 fL — ABNORMAL LOW (ref 80.0–100.0)
Monocytes Absolute: 0.5 10*3/uL (ref 0.1–1.0)
Monocytes Relative: 7 %
Neutro Abs: 4.2 10*3/uL (ref 1.7–7.7)
Neutrophils Relative %: 65 %
Platelets: 262 10*3/uL (ref 150–400)
RBC: 4.74 MIL/uL (ref 3.87–5.11)
RDW: 18.7 % — ABNORMAL HIGH (ref 11.5–15.5)
WBC: 6.5 10*3/uL (ref 4.0–10.5)
nRBC: 0 % (ref 0.0–0.2)

## 2022-11-04 LAB — PROCALCITONIN: Procalcitonin: 0.23 ng/mL

## 2022-11-04 MED ORDER — SODIUM CHLORIDE 0.9 % IV SOLN
2.0000 g | INTRAVENOUS | Status: DC
  Administered 2022-11-04 – 2022-11-05 (×2): 2 g via INTRAVENOUS
  Filled 2022-11-04 (×2): qty 20

## 2022-11-04 MED ORDER — METRONIDAZOLE 500 MG PO TABS
500.0000 mg | ORAL_TABLET | Freq: Two times a day (BID) | ORAL | Status: DC
  Administered 2022-11-04 – 2022-11-06 (×4): 500 mg via ORAL
  Filled 2022-11-04 (×4): qty 1

## 2022-11-04 NOTE — Progress Notes (Signed)
[facility] [address] [facility] Liaison RN note  Received request from [facility] for hospice services at home after discharge. Chart and patient information under review by Hospice physician.  Spoke with daughter Ezequiel Kayser at bedside to initiate education related to hospice philosophy, services, and team approach to care. Patient/family verbalized understanding of information given.   Per discussion, the plan is for discharge home in the next day or two.   DME needs discussed. Patient has the following equipment in the home. Walker. Patient/family requests the following equipment for deliver: shower bench, BSC and cane. Address has been verified and is correct in the chart. Ezequiel Kayser is the family contact to arrange time of equipment delivery.   Please send signed and completed DNR with patient/family. Please provide prescriptions at discharge as needed for ongoing symptom management.   Please call with any hospice related questions or concerns.  Thank you for the opportunity to participate in this patient's care.  Thea Gist, Charity fundraiser, BSN [employer] [phone]

## 2022-11-04 NOTE — Progress Notes (Signed)
Mobility Specialist - Progress Note   11/04/22 1244  Mobility  Activity Ambulated with assistance in hallway  Level of Assistance Standby assist, set-up cues, supervision of patient - no hands on  Assistive Device Front wheel walker  Distance Ambulated (ft) 500 ft  Activity Response Tolerated well  Mobility Referral Yes  $Mobility charge 1 Mobility  Mobility Specialist Start Time (ACUTE ONLY) 1224  Mobility Specialist Stop Time (ACUTE ONLY) 1243  Mobility Specialist Time Calculation (min) (ACUTE ONLY) 19 min   Pt received in bed and agreeable to mobility. Pt took x3 standing rest breaks due to fatigue. No complaints during session. Pt to EOB after session with all needs met.   [facility]

## 2022-11-04 NOTE — Progress Notes (Signed)
PROGRESS NOTE    Carolyn Fowler  ZOX:[id number] DOB: [birth year] DOA: 10/27/2022 PCP: Myrlene Broker, MD   Brief Narrative: Carolyn Fowler is a 77 y.o. female with a history of hypertension, left ventricular hypertrophy, chronic low back pain.  Patient presented secondary to progressive itching, jaundice, worsening lethargy and poor appetite.  CT imaging revealed an ill-defined hypodense porta hepatic hypodense mass measuring 5.8 x 4.7 cm with encasement of the hepatic portal veins, hepatic arteries, common bile duct with associated evidence of cholecystitis.  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  GI consulted and performed an ERCP and placement of a right hepatic duct stent.  Hepatic duct brushing revealed adenocarcinoma, likely cholangiocarcinoma.  Oncology consulted.  Patient deciding on more palliative/comfort measures approach but has ongoing discussions with oncology and palliative care.   Assessment and Plan:  Hepatobiliary adenocarcinoma Resulting biliary obstruction.  Gastroenterology was consulted and performed ERCP on 8/23 with placement of right hepatic duct stent.  Hepatic duct brushings revealed evidence of adenocarcinoma with concern for possible cholangiocarcinoma.  Medical oncology was consulted with discussions leaning more towards comfort measures/palliative care.  Palliative care is consulted and is following.  Obstruction improved with stent placement with LFTs appearing to have stabilized.  GI recommending consideration of PTC by IR at some point if necessary. Plan for home with hospice when ready to discharge. -Follow-up medical oncology/palliative care discussions  Biliary obstruction Symptoms have improved since stent placement.  Fever Likely related to above. No evidence of acute infection, however. Urinalysis obtained and appears to suggest infection. Procalcitonin slightly elevated. -Check blood cultures and add-on urine culture -Will  start empiric antibiotics after cultures obtained -Trend fever curve  Primary hypertension Patient is on carvedilol, amlodipine, and valsartan as an outpatient. -Continue amlodipine and carvedilol -Continue irbesartan (substituted for home valsartan)  GERD -Continue Pepcid twice daily  Hypokalemia Mild.  Resolved with potassium supplementation.  Morbid obesity Estimated body mass index is 42.43 kg/m as calculated from the following:   Height as of this encounter: 5\' 2"  (1.575 m).   Weight as of this encounter: 105.2 kg.   DVT prophylaxis: Lovenox Code Status:   Code Status: DNR Family Communication: Two daughters at bedside Disposition Plan: Discharge home likely in 24 hours pending continued goals of care discussions.   Consultants:  [place] [facility] Medical oncology Palliative care medicine  Procedures:  8/23: ERCP  Antimicrobials: Ciprofloxacin   Subjective: Febrile again overnight with Tmax of 101.6 F. Patient reports a history of urinary urgency but this has been chronic. Daughter thinks she has had new urinary symptoms since admission.  Objective: BP 132/68 (BP Location: Right Arm)   Pulse 66   Temp 99.1 F (37.3 C) (Oral)   Resp 18   Ht 5\' 2"  (1.575 m)   Wt 105.2 kg   SpO2 96%   BMI 42.43 kg/m   Examination:  General exam: Appears calm and comfortable Respiratory system: Clear to auscultation. Respiratory effort normal. Cardiovascular system: S1 & S2 heard, RRR. Gastrointestinal system: Abdomen is nondistended, soft and nontender. Normal bowel sounds heard. Central nervous system: Alert and oriented. No focal neurological deficits. Musculoskeletal: No calf tenderness Psychiatry: Judgement and insight appear normal. Mood & affect appropriate.    Data Reviewed: I have personally reviewed following labs and imaging studies  CBC Lab Results  Component Value Date   WBC 6.5 11/04/2022   RBC 4.74 11/04/2022   HGB 11.0 (L) 11/04/2022    HCT 35.1 (L) 11/04/2022   MCV 74.1 (L) 11/04/2022  MCH 23.2 (L) 11/04/2022   PLT 262 11/04/2022   MCHC 31.3 11/04/2022   RDW 18.7 (H) 11/04/2022   LYMPHSABS 1.5 11/04/2022   MONOABS 0.5 11/04/2022   EOSABS 0.2 11/04/2022   BASOSABS 0.0 11/04/2022     Last metabolic panel Lab Results  Component Value Date   NA 130 (L) 11/04/2022   K 3.3 (L) 11/04/2022   CL 95 (L) 11/04/2022   CO2 25 11/04/2022   BUN 10 11/04/2022   CREATININE 0.61 11/04/2022   GLUCOSE 104 (H) 11/04/2022   GFRNONAA >60 11/04/2022   GFRAA >90 10/21/2011   CALCIUM 8.2 (L) 11/04/2022   PHOS 3.0 11/01/2022   PROT 6.0 (L) 11/03/2022   ALBUMIN 2.4 (L) 11/03/2022   BILITOT 8.3 (H) 11/03/2022   ALKPHOS 286 (H) 11/03/2022   AST 100 (H) 11/03/2022   ALT 113 (H) 11/03/2022   ANIONGAP 10 11/04/2022    GFR: Estimated Creatinine Clearance: 68.1 mL/min (by C-G formula based on SCr of 0.61 mg/dL).  No results found for this or any previous visit (from the past 240 hour(s)).    Radiology Studies: DG Chest Port 1 View  Result Date: 11/04/2022 CLINICAL DATA:  Fever. EXAM: PORTABLE CHEST 1 VIEW COMPARISON:  10/25/2022. FINDINGS: The heart is enlarged and the mediastinal contour is stable. The aortic knob is prominent and unchanged from prior exams. Examination is limited due to patient rotation. Mild airspace disease is present at the lung bases. Linear atelectasis or scarring is noted in the mid right lung. No effusion or pneumothorax. No acute osseous abnormality. IMPRESSION: Mild airspace disease at the lung bases, possible atelectasis or infiltrate. Electronically Signed   By: Thornell Sartorius M.D.   On: 11/04/2022 00:16      LOS: 8 days    Jacquelin Hawking, MD [facility] 11/04/2022, 7:55 AM   If 7PM-7AM, please contact night-coverage [email or web address]

## 2022-11-04 NOTE — Consult Note (Signed)
New Hematology/Oncology Consult   Requesting MD: Jacquelin Hawking      Reason for Consult: Cholangiocarcinoma  HPI: Carolyn Fowler reports a 4-6-week history of pruritus.  She developed jaundice approximately 1 week prior to seeing Dr. Jonny Ruiz on 10/25/2022.  A chemistry panel was remarkable for an elevated alkaline phosphatase at 290, AST 174, ALT 278, and bilirubin 18.9.  CTs of the chest, abdomen, and pelvis on 10/27/2022 revealed an ill-defined hypodense porta hepatis mass with encasement of the portal vein.  The mass appeared to arise from the bile duct.  Moderate to severe intrahepatic biliary dilation was noted.  The common duct is encased and narrowed above the pancreas.  Necrotic porta hepatis nodes and prominent left periaortic nodes were seen.  The gallbladder is distended.  A thin-walled fluid collection in the right paracolic gutter was felt to represent a cyst or lymphangioma.  2 new 2 mm nodules in the left upper lobe.  She was admitted and gastroenterology was consulted.  She was taken to an ERCP by Dr. Leone Payor 10/29/2022.  A cytology brushing of the hepatic hilum biliary stricture was performed.  A stent was placed in the right system.  The left biliary system could not be accessed.  The hyperbilirubinemia has improved following stent placement.  Pruritus is much improved.  She reports nighttime fever.  Past Medical History:  Diagnosis Date   Allergy    Anemia    Arthritis    Cataract    forming    Dysrhythmia    occ palpitations- no cardiologist   Exposure to TB    1990's- was treated 2 x then, no s/s    GERD (gastroesophageal reflux disease)    History of keloid of skin    Hypertension    Neuromuscular disorder (HCC)    right leg tumbness and tingling from herniated disc  G3, P5, triplets   Past Surgical History:  Procedure Laterality Date   ABDOMINAL HYSTERECTOMY     APPENDECTOMY     BACK SURGERY  Aug 2013   BACK SURGERY  Aug 2016   L3 4 5 S1   BILIARY BRUSHING   10/29/2022   Procedure: BILIARY BRUSHING;  Surgeon: Iva Boop, MD;  Location: [facility];  Service: Gastroenterology;;   BILIARY STENT PLACEMENT N/A 10/29/2022   Procedure: BILIARY STENT PLACEMENT;  Surgeon: Iva Boop, MD;  Location: [facility];  Service: Gastroenterology;  Laterality: N/A;   COLONOSCOPY     COLONOSCOPY W/ POLYPECTOMY     ENDOSCOPIC RETROGRADE CHOLANGIOPANCREATOGRAPHY (ERCP) WITH PROPOFOL N/A 10/29/2022   Procedure: ENDOSCOPIC RETROGRADE CHOLANGIOPANCREATOGRAPHY (ERCP) WITH PROPOFOL;  Surgeon: Iva Boop, MD;  Location: [facility];  Service: Gastroenterology;  Laterality: N/A;   KELOID EXCISION     on ear   KNEE ARTHROSCOPY     left   KNEE SURGERY     POLYPECTOMY     SHOULDER OPEN ROTATOR CUFF REPAIR     left   SPHINCTEROTOMY  10/29/2022   Procedure: SPHINCTEROTOMY;  Surgeon: Iva Boop, MD;  Location: [facility];  Service: Gastroenterology;;   TUBAL LIGATION    :   Current Facility-Administered Medications:    acetaminophen (TYLENOL) tablet 650 mg, 650 mg, Oral, Q6H PRN, 650 mg at 11/03/22 2329 **OR** acetaminophen (TYLENOL) suppository 650 mg, 650 mg, Rectal, Q6H PRN, Iva Boop, MD   amLODipine (NORVASC) tablet 5 mg, 5 mg, Oral, Daily, Iva Boop, MD, 5 mg at 11/03/22 1128   carvedilol (COREG) tablet 25 mg, 25  mg, Oral, BID, Iva Boop, MD, 25 mg at 11/03/22 2148   dextrose 5 % and 0.45 % NaCl infusion, , Intravenous, Continuous, Iva Boop, MD, Last Rate: 75 mL/hr at 11/04/22 0439, New Bag at 11/04/22 0439   enoxaparin (LOVENOX) injection 40 mg, 40 mg, Subcutaneous, QPM, Iva Boop, MD, 40 mg at 11/03/22 1732   famotidine (PEPCID) tablet 20 mg, 20 mg, Oral, BID, Iva Boop, MD, 20 mg at 11/03/22 2141   hydrochlorothiazide (HYDRODIURIL) tablet 25 mg, 25 mg, Oral, Daily, Iva Boop, MD, 25 mg at 11/03/22 1320   hydrOXYzine (ATARAX) tablet 10 mg, 10 mg, Oral, TID, Iva Boop, MD, 10 mg at 11/03/22 2141    irbesartan (AVAPRO) tablet 300 mg, 300 mg, Oral, Daily, Iva Boop, MD, 300 mg at 11/03/22 1128   senna (SENOKOT) tablet 8.6 mg, 1 tablet, Oral, BID, Iva Boop, MD, 8.6 mg at 11/03/22 1021   traZODone (DESYREL) tablet 25 mg, 25 mg, Oral, QHS PRN, Iva Boop, MD:   amLODipine  5 mg Oral Daily   carvedilol  25 mg Oral BID   enoxaparin (LOVENOX) injection  40 mg Subcutaneous QPM   famotidine  20 mg Oral BID   hydrochlorothiazide  25 mg Oral Daily   hydrOXYzine  10 mg Oral TID   irbesartan  300 mg Oral Daily   senna  1 tablet Oral BID  :   Allergies  Allergen Reactions   Penicillins Itching, Swelling, Rash and Other (See Comments)    "Itched inside and out"   Egg-Derived Products Nausea And Vomiting   Tetracyclines & Related Nausea And Vomiting  :  FH: A nephew had a brain tumor  SOCIAL HISTORY: She lives with a son in [place].  She is retired after working with the [employer].  She does not use cigarettes or alcohol.  No transfusion history.  No risk factor for HIV or hepatitis.  Review of Systems: Pruritus for 6 weeks, jaundice, dark urine  Positives include:  A complete ROS was otherwise negative.   Physical Exam:  Blood pressure 132/68, pulse 66, temperature 99.1 F (37.3 C), temperature source Oral, resp. rate 18, height 5\' 2"  (1.575 m), weight 232 lb (105.2 kg), SpO2 96%.  HEENT: Scleral icterus, no thrush Lungs: Decreased breath sounds with coarse rhonchi at the posterior base bilaterally, no respiratory distress Cardiac: Regular rate and rhythm Abdomen: Nontender, no hepatosplenomegaly, no mass  Vascular: No leg edema Lymph nodes: No cervical, supraclavicular, axillary, or inguinal nodes Neurologic: Alert and oriented, the motor exam appears intact in the upper and lower extremities bilaterally Skin: No rash Musculoskeletal: No spine tenderness  LABS:   Recent Labs    11/02/22 0435 11/04/22 0452  WBC 7.6 6.5  HGB 11.5* 11.0*   HCT 35.7* 35.1*  PLT 264 262     Recent Labs    11/03/22 0440 11/04/22 0452  NA 138 130*  K 3.5 3.3*  CL 101 95*  CO2 28 25  GLUCOSE 109* 104*  BUN 8 10  CREATININE 0.63 0.61  CALCIUM 8.9 8.2*      RADIOLOGY:  DG Chest Port 1 View  Result Date: 11/04/2022 CLINICAL DATA:  Fever. EXAM: PORTABLE CHEST 1 VIEW COMPARISON:  10/25/2022. FINDINGS: The heart is enlarged and the mediastinal contour is stable. The aortic knob is prominent and unchanged from prior exams. Examination is limited due to patient rotation. Mild airspace disease is present at the lung bases. Linear atelectasis or  scarring is noted in the mid right lung. No effusion or pneumothorax. No acute osseous abnormality. IMPRESSION: Mild airspace disease at the lung bases, possible atelectasis or infiltrate. Electronically Signed   By: Thornell Sartorius M.D.   On: 11/04/2022 00:16   DG ERCP  Result Date: 10/30/2022 CLINICAL DATA:  ERCP for porta hepatis mass. EXAM: ERCP TECHNIQUE: Multiple spot images obtained with the fluoroscopic device and submitted for interpretation post-procedure. FLUOROSCOPY TIME: FLUOROSCOPY TIME 9 minutes, 17 seconds (401 mGy) COMPARISON:  MRCP-10/27/2022 FINDINGS: 9 spot intraoperative fluoroscopic images of the right upper abdominal quadrant during ERCP are provided for review. Initial image demonstrates an ERCP probe overlying the right upper abdominal quadrant. Sequela of previous lower lumbar paraspinal fusion, incompletely evaluated. Subsequent images demonstrate selective cannulation and opacification of the common bile duct. The common bile duct appears of normal caliber however there appears to be a tapered narrowing at the level of the biliary hilum with associated upstream intrahepatic biliary ductal dilatation. Subsequent images demonstrate placement of an internal biliary stent traversing the location of narrowing at the level of the hilum. IMPRESSION: ERCP with biliary stent placement as above.  These images were submitted for radiologic interpretation only. Please see the procedural report for the amount of contrast and the fluoroscopy time utilized. Electronically Signed   By: Simonne Come M.D.   On: 10/30/2022 09:42   DG C-Arm 1-60 Min-No Report  Result Date: 10/29/2022 Fluoroscopy was utilized by the requesting physician.  No radiographic interpretation.   MR ABDOMEN MRCP W WO CONTAST  Result Date: 10/28/2022 CLINICAL DATA:  77 year old female with history of probable mass in the porta hepatis on prior CT examination concerning for malignancy. Follow-up study. EXAM: MRI ABDOMEN WITHOUT AND WITH CONTRAST (INCLUDING MRCP) TECHNIQUE: Multiplanar multisequence MR imaging of the abdomen was performed both before and after the administration of intravenous contrast. Heavily T2-weighted images of the biliary and pancreatic ducts were obtained, and three-dimensional MRCP images were rendered by post processing. CONTRAST:  10mL GADAVIST GADOBUTROL 1 MMOL/ML IV SOLN COMPARISON:  No prior abdominal MRI. CT of the abdomen and pelvis 10/27/2022. FINDINGS: Lower chest: Unremarkable. Hepatobiliary: In the region of the hepatic hilum (axial image 35 of series 20 and coronal image 38 of series 22 there is a poorly defined infiltrative lesion which is low T1 signal intensity, mildly T2 hyperintense, demonstrates restricted diffusion, and demonstrates heterogeneous enhancement on post gadolinium imaging which appears to increase over time most pronounced on delayed images, highly concerning for probable cholangiocarcinoma. This is estimated to measure approximately 4.6 x 4.5 x 3.1 cm, and encases hilar structures causing severe narrowing of the proximal portal vein which remains patent at this time (best appreciated on axial image 34 of series 20), and complete obliteration of the bile ducts in this region, beyond which the common bile duct is completely decompressed. Severe intrahepatic biliary ductal dilatation  is noted. Gallbladder is severely distended. Gallbladder wall appears thickened and edematous with innumerable tiny filling defects in the gallbladder, indicative of gallstones. Tiny areas of diffusion restriction are noted in the liver in segment 4A (axial image 12 of series 6), and in segment 8 8 (axial image 11 of series 6). The lesion and 4A is not well demonstrated on other pulse sequences, but the lesion in segment 8 is T1 hypointense, T2 hyperintense and appears hypovascular on post gadolinium imaging, concerning for a metastatic lesion. Pancreas: No pancreatic mass. No pancreatic ductal dilatation noted on MRCP images. No peripancreatic fluid collections. Trace amount of  T2 signal intensity surrounding the pancreas. Spleen:  Unremarkable. Adrenals/Urinary Tract: Bilateral kidneys and adrenal glands are normal in appearance. No hydroureteronephrosis in the visualized portions of the abdomen. Stomach/Bowel: In the right upper quadrant of the abdomen inferior to the right lobe of the liver (axial image 27 of series 4 and coronal image 19 of series 3) there is a well-defined 5.4 x 7.0 x 4.6 cm T1 hypointense, T2 hyperintense, nonenhancing lesion which appears intimately associated with the ascending colon, benign in appearance, presumably an enteric duplication cyst. Otherwise, unremarkable. Vascular/Lymphatic: No aneurysm identified in the visualized abdominal vasculature. Severe narrowing of the portal vein and proximal left main portal vein secondary to encasement from the previously described mass in the porta hepatis. This mass also comes in close proximity to the inferior vena cava causing narrowing of the left renal vein which appears distended proximally. Common hepatic artery is encased by the mass. Numerous enlarged lymph nodes are noted in the upper abdomen measuring up to 1.2 cm in the gastrohepatic ligament (axial image 27 of series 20), notable for diffusion restriction, likely metastatic. Enlarged  heterogeneously enhancing and diffusion restricting left para-aortic lymph node immediately beneath the left renal hilum (axial image 51 of series 20) also concerning for metastatic lymph node. Other: No significant volume of ascites noted in the visualized portions of the peritoneal cavity. Musculoskeletal: Extensive susceptibility artifact noted throughout the lumbar spine related to indwelling spinal hardware. IMPRESSION: 1. Large aggressive appearing poorly defined infiltrative mass centered in the hepatic hilum estimated to measure approximately 4.6 x 4.5 x 3.1 cm causing central biliary obstruction resulting in severe intrahepatic biliary ductal dilatation, in addition to severe narrowing of numerous vascular structures, most notable for the main portal vein and proximal left portal vein, in addition to completely encasing the common hepatic artery. This is presumably a cholangiocarcinoma. Metastatic lymphadenopathy is noted in the upper abdomen and retroperitoneum, as above, in addition to 2 tiny lesions in the liver which likely represent intrahepatic metastases. 2. Cholelithiasis with a distended gallbladder with gallbladder wall thickening and edema. These findings may simply be related to intrinsic liver disease, although clinical correlation for signs and symptoms of acute cholecystitis is recommended. 3. 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 4. Additional incidental findings, as above. Electronically Signed   By: Trudie Reed M.D.   On: 10/28/2022 06:25   MR 3D Recon At Scanner  Result Date: 10/28/2022 CLINICAL DATA:  77 year old female with history of probable mass in the porta hepatis on prior CT examination concerning for malignancy. Follow-up study. EXAM: MRI ABDOMEN WITHOUT AND WITH CONTRAST (INCLUDING MRCP) TECHNIQUE: Multiplanar multisequence MR imaging of the abdomen was performed both before  and after the administration of intravenous contrast. Heavily T2-weighted images of the biliary and pancreatic ducts were obtained, and three-dimensional MRCP images were rendered by post processing. CONTRAST:  10mL GADAVIST GADOBUTROL 1 MMOL/ML IV SOLN COMPARISON:  No prior abdominal MRI. CT of the abdomen and pelvis 10/27/2022. FINDINGS: Lower chest: Unremarkable. Hepatobiliary: In the region of the hepatic hilum (axial image 35 of series 20 and coronal image 38 of series 22 there is a poorly defined infiltrative lesion which is low T1 signal intensity, mildly T2 hyperintense, demonstrates restricted diffusion, and demonstrates heterogeneous enhancement on post gadolinium imaging which appears to increase over time most pronounced on delayed images, highly concerning for probable cholangiocarcinoma. This is estimated to measure approximately 4.6 x 4.5 x  3.1 cm, and encases hilar structures causing severe narrowing of the proximal portal vein which remains patent at this time (best appreciated on axial image 34 of series 20), and complete obliteration of the bile ducts in this region, beyond which the common bile duct is completely decompressed. Severe intrahepatic biliary ductal dilatation is noted. Gallbladder is severely distended. Gallbladder wall appears thickened and edematous with innumerable tiny filling defects in the gallbladder, indicative of gallstones. Tiny areas of diffusion restriction are noted in the liver in segment 4A (axial image 12 of series 6), and in segment 8 8 (axial image 11 of series 6). The lesion and 4A is not well demonstrated on other pulse sequences, but the lesion in segment 8 is T1 hypointense, T2 hyperintense and appears hypovascular on post gadolinium imaging, concerning for a metastatic lesion. Pancreas: No pancreatic mass. No pancreatic ductal dilatation noted on MRCP images. No peripancreatic fluid collections. Trace amount of T2 signal intensity surrounding the pancreas.  Spleen:  Unremarkable. Adrenals/Urinary Tract: Bilateral kidneys and adrenal glands are normal in appearance. No hydroureteronephrosis in the visualized portions of the abdomen. Stomach/Bowel: In the right upper quadrant of the abdomen inferior to the right lobe of the liver (axial image 27 of series 4 and coronal image 19 of series 3) there is a well-defined 5.4 x 7.0 x 4.6 cm T1 hypointense, T2 hyperintense, nonenhancing lesion which appears intimately associated with the ascending colon, benign in appearance, presumably an enteric duplication cyst. Otherwise, unremarkable. Vascular/Lymphatic: No aneurysm identified in the visualized abdominal vasculature. Severe narrowing of the portal vein and proximal left main portal vein secondary to encasement from the previously described mass in the porta hepatis. This mass also comes in close proximity to the inferior vena cava causing narrowing of the left renal vein which appears distended proximally. Common hepatic artery is encased by the mass. Numerous enlarged lymph nodes are noted in the upper abdomen measuring up to 1.2 cm in the gastrohepatic ligament (axial image 27 of series 20), notable for diffusion restriction, likely metastatic. Enlarged heterogeneously enhancing and diffusion restricting left para-aortic lymph node immediately beneath the left renal hilum (axial image 51 of series 20) also concerning for metastatic lymph node. Other: No significant volume of ascites noted in the visualized portions of the peritoneal cavity. Musculoskeletal: Extensive susceptibility artifact noted throughout the lumbar spine related to indwelling spinal hardware. IMPRESSION: 1. Large aggressive appearing poorly defined infiltrative mass centered in the hepatic hilum estimated to measure approximately 4.6 x 4.5 x 3.1 cm causing central biliary obstruction resulting in severe intrahepatic biliary ductal dilatation, in addition to severe narrowing of numerous vascular  structures, most notable for the main portal vein and proximal left portal vein, in addition to completely encasing the common hepatic artery. This is presumably a cholangiocarcinoma. Metastatic lymphadenopathy is noted in the upper abdomen and retroperitoneum, as above, in addition to 2 tiny lesions in the liver which likely represent intrahepatic metastases. 2. Cholelithiasis with a distended gallbladder with gallbladder wall thickening and edema. These findings may simply be related to intrinsic liver disease, although clinical correlation for signs and symptoms of acute cholecystitis is recommended. 3. 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 4. Additional incidental findings, as above. Electronically Signed   By: Trudie Reed M.D.   On: 10/28/2022 06:25   DG Chest 2 View  Result Date: 10/27/2022 CLINICAL DATA:  New onset jaundice and right upper quadrant pain with  pruritus. EXAM: CHEST - 2 VIEW COMPARISON:  PA Lat 02/19/2022, chest CT no contrast 03/31/2022 FINDINGS: The heart is enlarged but unchanged. The aorta is tortuous and dilated with scattered calcific plaques, stable mediastinal configuration. No vascular congestion is seen. The lungs are radiographically clear with no substantial pleural effusion. There is osteopenia, kyphosis and degenerative change of the spine with no new osseous findings. IMPRESSION: 1. No evidence of acute chest disease. 2. Stable cardiomegaly and aortic dilatation and atherosclerosis. 3. Osteopenia, kyphosis and degenerative change of the spine. Electronically Signed   By: Almira Bar M.D.   On: 10/27/2022 21:03   CT CHEST ABDOMEN PELVIS W CONTRAST  Result Date: 10/27/2022 CLINICAL DATA:  Metastatic disease evaluation. Patient with hepatic obstruction. Increasing fatigue, weight loss and poor appetite. EXAM: CT CHEST, ABDOMEN, AND PELVIS WITH CONTRAST TECHNIQUE:  Multidetector CT imaging of the chest, abdomen and pelvis was performed following the standard protocol during bolus administration of intravenous contrast. RADIATION DOSE REDUCTION: This exam was performed according to the departmental dose-optimization program which includes automated exposure control, adjustment of the mA and/or kV according to patient size and/or use of iterative reconstruction technique. CONTRAST:  OMNIPAQUE IOHEXOL 300 MG/ML  SOLN COMPARISON:  PA Lat chest 10/25/2022, chest CT no contrast 03/31/2022. No prior abdomen pelvis CT. FINDINGS: CT CHEST FINDINGS Cardiovascular: There is mild cardiomegaly with heavy calcification in the inferolateral mitral ring. There is no pericardial effusion. Minimal two-vessel coronary calcifications LAD and circumflex. Pulmonary trunk dilatation 4.4 cm is again noted indicating arterial hypertension. No central embolus is seen. Mild prominence of the central pulmonary veins is again noted but unchanged. There is chronic tortuosity and dilatation of the aorta, tortuous and ectatic great vessels exaggerating the superior mediastinum. There are scattered calcifications in the arch and descending aorta. The aortic root is 3.9 cm at the sinuses of Valsalva and again measures 4.3 cm in the ascending segment, 3.8 cm in the arch and descending segments. Mediastinum/Nodes: There are a few bilateral subcentimeter hypodense nodules in the lower poles of the thyroid gland but no nodules requiring imaging follow-up. Axillary spaces are clear. There is no intrathoracic adenopathy. Small hiatal hernia with unremarkable thoracic esophagus, thoracic trachea. Both main bronchi are clear. Lungs/Pleura: Mild chronic elevation right hemidiaphragm. There is left lower lobe medial basal linear scarring associated with mild bronchiectasis. Additional scattered scar-like opacities both bases are again noted and mild subpleural reticulation in the right middle and lower lobe bases.  There is a calcified left lower lobe granuloma. There is an 8 mm pleural-based right lower lobe nodule laterally on 6:72 which was previously 4 mm, with linear scarring or atelectasis again extending medially from the nodule towards the hilum. There is a stable 3 mm subpleural nodule in the right middle lobe laterally on 6:74. Not seen previously, 2 nodules each measuring 2 mm are noted in the anterior aspect of the left upper lobe on 6:45 and 47. There is a 2 mm right upper lobe nodule laterally on 6:42 which was seen previously, unchanged. No other new nodules are seen. There are no active infiltrates, no pleural effusion or pneumothorax. Musculoskeletal: There is osteopenia, lower thoracic spine bridging enthesopathy and multilevel degenerative thoracic discs with mild kyphodextroscoliosis. No aggressive regional bone lesion is seen. There are dystrophic calcifications in the anterior left chest wall with no visible chest wall mass. CT ABDOMEN PELVIS FINDINGS Hepatobiliary: Not seen on 03/31/2022 there is an ill-defined hypodense porta hepatis mass, on 2:49 estimated to measure as  much as 5.8 x 4.7 cm. The mass encases and severely narrows the hepatic portal main vein, the right hepatic portal vein origin, and the proximal 1 cm of left hepatic portal vein. Faint contrast is still seen in these vessels distal to the narrowing. The mass encases the main hepatic artery and both the proximal left and right hepatic arteries but does not seem to narrow them. The mass is difficult to separate from the overlying caudate hepatic lobe but I suspect is of biliary origin as the common hepatic duct appears to terminate in the mass. There is moderate to severe intrahepatic biliary dilatation above this. The common bile duct is encased and severely narrowed above the level of the pancreas. Except where the caudate hepatic lobe abuts and is difficult to separate from the underlying mass, the rest of the liver enhances  homogeneously without a visible mass. The gallbladder is dilated and thick-walled, distended up to 12 cm in length. This is probably due to obstructive cholecystitis. The cystic duct is not seen. There is trace pericholecystic fluid. Pancreas: There are enlarged periportal lymph nodes abutting the anterior and posterior head of the pancreas, with the pancreas itself enhances homogeneously without a visible mass and no ductal dilatation or inflammatory change. Spleen: No abnormality.  No splenomegaly. Adrenals/Urinary Tract: Chronic slight nodular thickening both adrenal glands, was also noted on the CTA chest from 09/13/2009 and unchanged. The kidneys enhance homogeneously except for a 1.3 cm homogeneous cyst in the superior pole of the left kidney, Hounsfield density is -2. No follow-up imaging is needed. There is no urinary stone or obstruction. The bladder unremarkable for the degree of distention. Stomach/Bowel: No dilatation or wall thickening. Uncomplicated sigmoid diverticulosis. Vascular/Lymphatic: There is patchy aortoiliac atherosclerosis without AAA. The iliac arteries are tortuous but clear. There are partially necrotic porta hepatis lymph nodes, 1 noted abutting the anterior head of pancreas on 2:50 measuring 3.3 x 1.7 cm, and another abutting the posterior head of pancreas measuring 2.7 x 1.6 cm on the same image. There are few mildly prominent left para-aortic chain nodes up to 1.3 cm in short axis. No pelvic or mesenteric adenopathy. Reproductive: Status post hysterectomy. No adnexal masses. Other: In the left paracolic gutter just inferior to the tip of the liver, there is a unilocular thin walled homogeneous ovoid simple appearing fluid collection measuring 7 x 4.5 x 6.4 cm, Hounsfield density is 23. This abuts and deforms the lateral wall of the mid to distal ascending colon. 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 There are no other fluid collections. There is no free ascites, free air, or free hemorrhage, and no incarcerated hernia. Musculoskeletal: There is lumbar spinal fusion hardware L3-S1, osteopenia, degenerative changes of the spine and mild lumbar dextroscoliosis. There is advanced L1-2 disc collapse with peridiscal reactive endplate changes and vacuum phenomenon in the disc space. There is severe acquired foraminal stenosis at this level. There is grade 1 L1-2 discogenic retrolisthesis. There is no visible metastatic bone lesion in the imaged volume. IMPRESSION: 1. 5.8 x 4.7 cm ill-defined hypodense porta hepatis mass with encasement and severe narrowing of the main portal vein, right hepatic portal vein origin, and proximal 1 cm of left hepatic portal vein. The mass is difficult to separate from the caudate hepatic lobe but I suspect is of biliary origin as the common  hepatic duct appears to terminate in the mass. 2. There is moderate to severe intrahepatic biliary dilatation above the mass, and the common bile duct is encased and severely narrowed above the level of the pancreas. 3. The mass encases the main hepatic artery and both proximal left and right hepatic arteries but does not seem to narrow them. 4. There are partially necrotic porta hepatis lymph nodes and a few mildly prominent left para-aortic chain nodes. 5. Severely distended gallbladder with wall thickening and trace pericholecystic fluid, findings consistent with obstructive cholecystitis. 6. 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 7. 8 mm right lower lobe nodule, previously 4 mm, with 2 new 2 mm nodules in the anterior aspect of the left upper lobe. 8. Aortic atherosclerosis. Again noted 4.3 cm aneurysmal prominence of the ascending aorta, ectasia in the arch and descending segments with tortuosity. Recommend annual imaging followup by CTA or MRA. This recommendation follows 2010 ACCF/AHA/AATS/ACR/ ASA/SCA/SCAI/SIR/STS/SVM Guidelines for the Diagnosis and Management of Patients with Thoracic Aortic Disease. Circulation. 2010; 121: W102-V253. Aortic aneurysm NOS (ICD10-I71.9). 9. Cardiomegaly with trace calcific CAD. Apparently chronic prominence of the central veins. 10. Chronic dilatation of the pulmonary trunk. Aortic Atherosclerosis (ICD10-I70.0). Electronically Signed   By: Almira Bar M.D.   On: 10/27/2022 21:00    Assessment and Plan:   1.  Cholangiocarcinoma CTs 10/27/2022-ill-defined hypodense porta hepatis mass with encasement and narrowing of the main portal vein, difficult to separate from the caudate hepatic lobe, but suspected to be of biliary origin, moderate to severe intrahepatic biliary dilation, common bile duct encased and severely narrowed above the pancreas, mass encases the hepatic artery, necrotic porta hepatis nodes and few mildly prominent left aortic nodes, severely distended gallbladder, 2 new 2 mm left upper lobe nodules, enlarging right lower lobe nodule MRI abdomen 10/27/2022-infiltrative mass at the hepatic hilum measuring 4.6 x 4.5 x 3.1 cm causing biliary obstruction with narrowing of the main portal vein, proximal left portal vein, and encasing the common hepatic artery.  Metastatic lymphadenopathy in the upper abdomen and retroperitoneum, 2 tiny liver lesions consistent with intrahepatic metastases, distended gallbladder, simple appearing cyst in the right abdomen ERCP 10/29/2022-single localized common hepatic duct stricture, lignan appearing, brush biopsy formed,, right  hepatic duct stent placed, left duct system could not be accessed  2.  Obstructive jaundice/pruritus secondary to #1 3.  Hypertension 4.  History of back surgery 5.  Fever 6.  Microcytosis-chronic  Carolyn Fowler was admitted with obstructive jaundice.  Imaging studies revealed a mass at the hepatic hilum.  The clinical presentation is most consistent with cholangiocarcinoma, though it is possible she has metastatic disease from another primary tumor site.  She has undergone placement of a palliative right hepatic stent.  The pruritus is significantly improved and the bilirubin is lower.  I discussed the diagnosis and treatment options with Carolyn Fowler and her daughter.  I reviewed the CT images with her.  She understands the only potentially curative treatment for cholangiocarcinoma is surgery.  The tumor appears unresectable and there is also evidence of metastatic disease involving abdominal lymph nodes, the liver, and lungs.  I discussed standard systemic therapy with gemcitabine/cisplatin/durvalumab.  We discussed  the treatment schedule.  Carolyn Fowler indicated she does not wish to receive chemotherapy.  I recommend home hospice care if she decides against chemotherapy.  We discussed the expected survival of months in the absence of systemic therapy.  She has developed a nighttime fever over the past several days.  The fever could be related to "tumor fever ", atelectasis, or infection.  She is at risk for developing a biliary infection in the setting of ongoing left hepatic duct obstruction.  Urine and blood cultures are pending.  Recommendations: [facility] referral for home hospice care if she decides not to consider chemotherapy Evaluation of the fever per the medical service, consider an external drain to decompress the left biliary system the fever and hyperbilirubinemia persist I will be glad to serve as the primary provider with the hospice team  Outpatient follow-up will  be scheduled at the Cancer center Thornton Papas, MD 11/04/2022, 7:58 AM

## 2022-11-04 NOTE — Progress Notes (Signed)
    Patient Name: Carolyn Fowler           DOB: 08-28-1945  MRN: [id number]      Admission Date: 10/27/2022  Attending Provider: Narda Bonds, MD  Primary Diagnosis: Hypodense mass of liver   Level of care: Med-Surg    CROSS COVER NOTE   Date of Service   11/04/2022   [name], 77 y.o. female, was admitted on 10/27/2022 for Hypodense mass of liver.    HPI/Events of Note   Febrile, temp max 101.6, tachypneic (RR 24) without hypoxemia, increased WOB, SOB, or cough. Related to to biliary obstruction?  Chest x-ray- Mild airspace disease at the lung bases, possible atelectasis or infiltrate. Follow WBC trend.  Consider antibiotics if procalcitonin elevated Continue incentive spirometry    Interventions/ Plan   Above        Anthoney Harada, DNP, [facility] [place]

## 2022-11-04 NOTE — Plan of Care (Signed)

## 2022-11-04 NOTE — TOC Initial Note (Signed)
Transition of Care [facility]) - Initial/Assessment Note    Patient Details  Name: Carolyn Fowler MRN: [id number] Date of Birth: 1945/05/15  [facility] CM/SW Contact:    Amada Jupiter, LCSW Phone Number: 11/04/2022, 2:16 PM  Clinical Narrative:                  Received referral today from PMT to assist with referral to hospice program.  Met with pt and daughter to confirm plan for home with hospice services - they are agreed and daughter chooses [facility] collective for hospice coverage.  Referral made and TOC will follow along for any additional needs.  Expected Discharge Plan: Home w Hospice Care Barriers to Discharge: Continued Medical Work up   Patient Goals and CMS Choice Patient states their goals for this hospitalization and ongoing recovery are:: return home          Expected Discharge Plan and Services     Post Acute Care Choice: Hospice Living arrangements for the past 2 months: Single Family Home                           HH Arranged: RN [facility] Agency: [facility] Date [facility] Agency Contacted: 11/04/22 Time HH Agency Contacted: [phone]    Prior Living Arrangements/Services Living arrangements for the past 2 months: Single Family Home Lives with:: Adult Children Patient language and need for interpreter reviewed:: Yes        Need for Family Participation in Patient Care: Yes (Comment) Care giver support system in place?: Yes (comment)   Criminal Activity/Legal Involvement Pertinent to Current Situation/Hospitalization: No - Comment as needed  Activities of Daily Living Home Assistive Devices/Equipment: Cane (specify quad or straight) ADL Screening (condition at time of admission) Patient's cognitive ability adequate to safely complete daily activities?: Yes Is the patient deaf or have difficulty hearing?: No Does the patient have difficulty seeing, even when wearing glasses/contacts?:  No Does the patient have difficulty concentrating, remembering, or making decisions?: No Patient able to express need for assistance with ADLs?: Yes Does the patient have difficulty dressing or bathing?: No Independently performs ADLs?: Yes (appropriate for developmental age) Communication: Independent Dressing (OT): Independent Grooming: Independent Feeding: Independent Bathing: Independent Toileting: Independent In/Out Bed: Independent Walks in Home: Independent Does the patient have difficulty walking or climbing stairs?: Yes Weakness of Legs: Both Weakness of Arms/Hands: None  Permission Sought/Granted Permission sought to share information with : Family Supports    Share Information with NAME: daughter, Debbe Odea @ [phone]           Emotional Assessment Appearance:: Appears stated age Attitude/Demeanor/Rapport: Lethargic Affect (typically observed): Flat, Quiet Orientation: : Oriented to Self, Oriented to Place, Oriented to  Time, Oriented to Situation Alcohol / Substance Use: Not Applicable Psych Involvement: No (comment)  Admission diagnosis:  Jaundice [R17] Hepatic flexure mass [K63.89] Hypodense mass of liver [R16.0] Abdominal aortic aneurysm (AAA) without rupture, unspecified part (HCC) [I71.40] Patient Active Problem List   Diagnosis Date Noted   Obstructive jaundice 10/29/2022   Hypodense mass of liver 10/27/2022   Jaundice 10/25/2022   RUQ pain 10/25/2022   Cough 02/02/2022   Pre-diabetes 01/22/2022   Numbness and tingling of both feet 01/20/2021   Positive ANA (antinuclear antibody) 07/09/2020   Bilateral hand pain 07/09/2020   Morbid obesity (HCC) 04/23/2016   GERD (gastroesophageal reflux disease) 04/23/2016   Community acquired pneumonia of left lower lobe  of lung 10/16/2014   Chronic lower back pain 10/04/2014   Scoliosis 10/04/2014   Liver cyst 06/19/2012   Spinal stenosis of lumbar region at multiple levels 10/27/2011   Routine health  maintenance 07/05/2011   Essential hypertension 03/15/2007   PCP:  Myrlene Broker, MD Pharmacy:   [facility] [address] ([place]), [place] - [address] [place] ([place]) [place] [address] Phone: [phone] Fax: [phone]     Social Determinants of Health (SDOH) Social History: SDOH Screenings   Food Insecurity: No Food Insecurity (10/28/2022)  Housing: Patient Declined (10/28/2022)  Transportation Needs: No Transportation Needs (10/28/2022)  Utilities: Not At Risk (10/28/2022)  Alcohol Screen: Low Risk  (09/24/2019)  Depression (PHQ2-9): Low Risk  (10/25/2022)  Financial Resource Strain: Low Risk  (01/22/2022)  Physical Activity: Inactive (01/22/2022)  Social Connections: Moderately Isolated (01/22/2022)  Stress: No Stress Concern Present (01/22/2022)  Tobacco Use: Low Risk  (10/29/2022)   SDOH Interventions:     Readmission Risk Interventions    10/29/2022   10:17 AM  Readmission Risk Prevention Plan  Post Dischage Appt Complete  Medication Screening Complete  Transportation Screening Complete

## 2022-11-04 NOTE — Progress Notes (Signed)
Daily Progress Note   Patient Name: Carolyn Fowler       Date: 11/04/2022 DOB: 08-Jul-1945  Age: 77 y.o. MRN#: [id number] Attending Physician: Narda Bonds, MD Primary Care Physician: Myrlene Broker, MD Admit Date: 10/27/2022  Reason for Consultation/Follow-up: Establishing goals of care - cholangiocarcinoma   Patient Profile/HPI:   77 y.o. female  with past medical history of HTN admitted on 10/27/2022 with weight loss and elevated LFTs, progressively worsening jaundice, weakness- sent to ED by primary d/t elevated LFTs. CT revealed ill defined hypodense porta hepatic mass 5.8x4.7cm, encasement of hepatic portal vein, common bile duct, hepatic artery; partially necrotic hepatic lymph nodes, distended gall bladder. Concerns for cholangiocarcinoma. S/p ERCP with stent placement which has resulted in improvement of LFTs. Biopsy resulted in adenocarcinoma likely cholangiocarcinoma. Palliative consulted due to patient has expressed she is not interested in pursuing chemotherapy or other aggressive interventions. Oncology consulted and had brief discussion with patient and daughter with plans for followup at cancer center.   Subjective: Chart reviewed including labs, progress notes, imaging from this and previous encounters.  Noted patient with fevers overnight and infectious workup in process.  I met with Carolyn Fowler and her daughter Carolyn Fowler at the bedside.  Carolyn Fowler is very clear in her desire to not proceed with chemotherapy or radiation. She notes her goals are to go home, have her symptoms well managed and die in peace. Her children are very supportive of her decision. Hospice philosophy of care and services were discussed. We discussed the possible trajectories of her illness. She is not  currently having pain or nausea, but understands she may have some as her cancer progresses.  She and her daughter would like to continue her infectious workup and treat any treatable illnesses for now.    Review of Systems  Constitutional:  Positive for fever and malaise/fatigue.  Respiratory:  Negative for shortness of breath.   Gastrointestinal:  Negative for abdominal pain.     Physical Exam Vitals and nursing note reviewed.  Constitutional:      General: She is not in acute distress. Eyes:     General: Scleral icterus present.  Neurological:     Mental Status: She is alert and oriented to person, place, and time.  Psychiatric:        Mood and Affect: Mood  normal.        Behavior: Behavior normal.        Thought Content: Thought content normal.        Judgment: Judgment normal.             Vital Signs: BP 135/72 (BP Location: Right Arm)   Pulse 66   Temp 99.4 F (37.4 C) (Oral)   Resp 19   Ht 5\' 2"  (1.575 m)   Wt 105.2 kg   SpO2 98%   BMI 42.43 kg/m  SpO2: SpO2: 98 % O2 Device: O2 Device: Room Air O2 Flow Rate: O2 Flow Rate (L/min): 2 L/min  Intake/output summary:  Intake/Output Summary (Last 24 hours) at 11/04/2022 1032 Last data filed at 11/04/2022 0757 Gross per 24 hour  Intake 2474.11 ml  Output 1600 ml  Net 874.11 ml   LBM: Last BM Date : 10/31/22 Baseline Weight: Weight: 105.2 kg Most recent weight: Weight: 105.2 kg       Palliative Assessment/Data: PPS:  40%      Patient Active Problem List   Diagnosis Date Noted   Obstructive jaundice 10/29/2022   Hypodense mass of liver 10/27/2022   Jaundice 10/25/2022   RUQ pain 10/25/2022   Cough 02/02/2022   Pre-diabetes 01/22/2022   Numbness and tingling of both feet 01/20/2021   Positive ANA (antinuclear antibody) 07/09/2020   Bilateral hand pain 07/09/2020   Morbid obesity (HCC) 04/23/2016   GERD (gastroesophageal reflux disease) 04/23/2016   Community acquired pneumonia of left lower lobe of  lung 10/16/2014   Chronic lower back pain 10/04/2014   Scoliosis 10/04/2014   Liver cyst 06/19/2012   Spinal stenosis of lumbar region at multiple levels 10/27/2011   Routine health maintenance 07/05/2011   Essential hypertension 03/15/2007    Palliative Care Assessment & Plan    Assessment/Recommendations/Plan  TOC referral to offer choice and refer for hospice services at home Continue workup for fevers On discharge, would recommend scripts for: - Morphine Concentrate 10mg /0.60ml: 5mg  (0.52ml) sublingual every 1 hour as needed for pain or shortness of breath: Disp 30ml - Lorazepam 2mg /ml concentrated solution: 1mg  (0.54ml) sublingual every 4 hours as needed for anxiety: Disp 30ml - Haldol 2mg /ml solution: 0.5mg  (0.12ml) sublingual every 4 hours as needed for agitation or nausea: Disp 30ml  - Ondansetron ODT 4mg - dissolve one tablet in mouth ever 8 hours as needed for nausea #20   Code Status: DNR  Prognosis:  < 6 months  Discharge Planning: Home with Hospice  Care plan was discussed with patient, her daughter, and patient's care team.   Thank you for allowing the Palliative Medicine Team to assist in the care of this patient.  Total time:  80 minutes  Greater than 50%  of this time was spent counseling and coordinating care related to the above assessment and plan.  Ocie Bob, AGNP-C Palliative Medicine   Please contact Palliative Medicine Team phone at [phone] for questions and concerns.

## 2022-11-05 DIAGNOSIS — C220 Liver cell carcinoma: Secondary | ICD-10-CM | POA: Diagnosis not present

## 2022-11-05 DIAGNOSIS — D509 Iron deficiency anemia, unspecified: Secondary | ICD-10-CM | POA: Diagnosis not present

## 2022-11-05 DIAGNOSIS — I10 Essential (primary) hypertension: Secondary | ICD-10-CM | POA: Diagnosis not present

## 2022-11-05 DIAGNOSIS — R160 Hepatomegaly, not elsewhere classified: Secondary | ICD-10-CM | POA: Diagnosis not present

## 2022-11-05 DIAGNOSIS — C801 Malignant (primary) neoplasm, unspecified: Secondary | ICD-10-CM | POA: Diagnosis not present

## 2022-11-05 DIAGNOSIS — K831 Obstruction of bile duct: Secondary | ICD-10-CM | POA: Diagnosis not present

## 2022-11-05 DIAGNOSIS — R509 Fever, unspecified: Secondary | ICD-10-CM | POA: Diagnosis not present

## 2022-11-05 LAB — COMPREHENSIVE METABOLIC PANEL
ALT: 118 U/L — ABNORMAL HIGH (ref 0–44)
AST: 107 U/L — ABNORMAL HIGH (ref 15–41)
Albumin: 2.2 g/dL — ABNORMAL LOW (ref 3.5–5.0)
Alkaline Phosphatase: 297 U/L — ABNORMAL HIGH (ref 38–126)
Anion gap: 9 (ref 5–15)
BUN: 9 mg/dL (ref 8–23)
CO2: 25 mmol/L (ref 22–32)
Calcium: 8.2 mg/dL — ABNORMAL LOW (ref 8.9–10.3)
Chloride: 99 mmol/L (ref 98–111)
Creatinine, Ser: 0.47 mg/dL (ref 0.44–1.00)
GFR, Estimated: >60 mL/min (ref >60)
Glucose, Bld: 111 mg/dL — ABNORMAL HIGH (ref 70–99)
Potassium: 3 mmol/L — ABNORMAL LOW (ref 3.5–5.1)
Sodium: 133 mmol/L — ABNORMAL LOW (ref 135–145)
Total Bilirubin: 7.9 mg/dL — ABNORMAL HIGH (ref 0.3–1.2)
Total Protein: 6 g/dL — ABNORMAL LOW (ref 6.5–8.1)

## 2022-11-05 MED ORDER — POTASSIUM CHLORIDE CRYS ER 20 MEQ PO TBCR
40.0000 meq | EXTENDED_RELEASE_TABLET | ORAL | Status: AC
  Administered 2022-11-05 (×2): 40 meq via ORAL
  Filled 2022-11-05 (×2): qty 2

## 2022-11-05 MED ORDER — ACETAMINOPHEN 650 MG RE SUPP: 650.0000 mg | RECTAL | Status: DC | PRN

## 2022-11-05 MED ORDER — ACETAMINOPHEN 325 MG PO TABS
650.0000 mg | ORAL_TABLET | ORAL | Status: DC | PRN
  Filled 2022-11-05: qty 2

## 2022-11-05 NOTE — Progress Notes (Signed)
Mobility Specialist - Progress Note   11/05/22 0854  Mobility  Activity Ambulated with assistance in hallway  Level of Assistance Standby assist, set-up cues, supervision of patient - no hands on  Assistive Device Front wheel walker  Distance Ambulated (ft) 250 ft  Activity Response Tolerated well  Mobility Referral Yes  $Mobility charge 1 Mobility  Mobility Specialist Start Time (ACUTE ONLY) 0840  Mobility Specialist Stop Time (ACUTE ONLY) 0853  Mobility Specialist Time Calculation (min) (ACUTE ONLY) 13 min   Pt received in bathroom and agreeable to mobility. No complaints during session. Pt to bed after session with all needs met.    [facility]

## 2022-11-05 NOTE — Progress Notes (Signed)
Mobility Specialist - Progress Note   11/05/22 1348  Mobility  Activity Ambulated with assistance in hallway;Ambulated with assistance to bathroom  Level of Assistance Standby assist, set-up cues, supervision of patient - no hands on  Assistive Device Front wheel walker  Distance Ambulated (ft) 250 ft  Activity Response Tolerated well  Mobility Referral Yes  $Mobility charge 1 Mobility  Mobility Specialist Start Time (ACUTE ONLY) 0117  Mobility Specialist Stop Time (ACUTE ONLY) 0148  Mobility Specialist Time Calculation (min) (ACUTE ONLY) 31 min   Pt received in bed and agreeable to mobility. No complaints during session. Pt to bed after session with all needs met.     [facility]

## 2022-11-05 NOTE — Progress Notes (Signed)
PROGRESS NOTE    Carolyn Fowler  ZOX:[id number] DOB: Nov 29, 1945 DOA: 10/27/2022 PCP: Myrlene Broker, MD   Brief Narrative: Carolyn Fowler is a 77 y.o. female with a history of hypertension, left ventricular hypertrophy, chronic low back pain.  Patient presented secondary to progressive itching, jaundice, worsening lethargy and poor appetite.  CT imaging revealed an ill-defined hypodense porta hepatic hypodense mass measuring 5.8 x 4.7 cm with encasement of the hepatic portal veins, hepatic arteries, common bile duct with associated evidence of cholecystitis.  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  GI consulted and performed an ERCP and placement of a right hepatic duct stent.  Hepatic duct brushing revealed adenocarcinoma, likely cholangiocarcinoma.  Oncology consulted.  Patient deciding on more palliative/comfort measures approach but has ongoing discussions with oncology and palliative care.   Assessment and Plan:  Hepatobiliary adenocarcinoma Resulting biliary obstruction.  Gastroenterology was consulted and performed ERCP on 8/23 with placement of right hepatic duct stent.  Hepatic duct brushings revealed evidence of adenocarcinoma with concern for possible cholangiocarcinoma.  Medical oncology was consulted with discussions leaning more towards comfort measures/palliative care.  Palliative care is consulted and is following.  Obstruction improved with stent placement with LFTs appearing to have stabilized.  GI recommending consideration of PTC by IR at some point if necessary. Plan for home with hospice when ready to discharge. -Follow-up medical oncology/palliative care discussions  Biliary obstruction Symptoms have improved since stent placement.  Fever Likely related to above. No evidence of acute infection, however. Urinalysis obtained and appears to suggest infection. Procalcitonin slightly elevated. -Check blood cultures and add-on urine culture -Will  start empiric antibiotics after cultures obtained; continue Ceftriaxone -Flagyl added for possible intra-abdominal source -Trend fever curve  Primary hypertension Patient is on carvedilol, amlodipine, and valsartan as an outpatient. -Continue amlodipine and carvedilol -Continue irbesartan (substituted for home valsartan)  GERD -Continue Pepcid twice daily  Hypokalemia Mild.  Resolved with potassium supplementation.  Morbid obesity Estimated body mass index is 42.43 kg/m as calculated from the following:   Height as of this encounter: 5\' 2"  (1.575 m).   Weight as of this encounter: 105.2 kg.   DVT prophylaxis: Lovenox Code Status:   Code Status: Do not attempt resuscitation (DNR) PRE-ARREST INTERVENTIONS DESIRED Family Communication: Son, grandson and daughter at bedside Disposition Plan: Discharge home likely in 24 hours pending continued fever workup   Consultants:  [facility] Medical oncology Palliative care medicine  Procedures:  8/23: ERCP  Antimicrobials: Ciprofloxacin   Subjective: Tmax of 103.0 F overnight. No new symptoms.  Objective: BP (!) 122/92 (BP Location: Right Arm)   Pulse 72   Temp 99.1 F (37.3 C) (Oral)   Resp 18   Ht 5\' 2"  (1.575 m)   Wt 105.2 kg   SpO2 100%   BMI 42.43 kg/m   Examination:  General exam: Appears calm and comfortable Respiratory system: Clear to auscultation. Respiratory effort normal. Cardiovascular system: S1 & S2 heard, RRR. Gastrointestinal system: Abdomen is nondistended, soft and nontender. Normal bowel sounds heard. Central nervous system: Alert and oriented. No focal neurological deficits. Musculoskeletal: No edema. No calf tenderness Skin: No cyanosis. No rashes Psychiatry: Judgement and insight appear normal. Mood & affect appropriate.    Data Reviewed: I have personally reviewed following labs and imaging studies  CBC Lab Results  Component Value Date   WBC 6.5 11/04/2022   RBC 4.74  11/04/2022   HGB 11.0 (L) 11/04/2022   HCT 35.1 (L) 11/04/2022   MCV 74.1 (L)  11/04/2022   MCH 23.2 (L) 11/04/2022   PLT 262 11/04/2022   MCHC 31.3 11/04/2022   RDW 18.7 (H) 11/04/2022   LYMPHSABS 1.5 11/04/2022   MONOABS 0.5 11/04/2022   EOSABS 0.2 11/04/2022   BASOSABS 0.0 11/04/2022     Last metabolic panel Lab Results  Component Value Date   NA 133 (L) 11/05/2022   K 3.0 (L) 11/05/2022   CL 99 11/05/2022   CO2 25 11/05/2022   BUN 9 11/05/2022   CREATININE 0.47 11/05/2022   GLUCOSE 111 (H) 11/05/2022   GFRNONAA >60 11/05/2022   GFRAA >90 10/21/2011   CALCIUM 8.2 (L) 11/05/2022   PHOS 3.0 11/01/2022   PROT 6.0 (L) 11/05/2022   ALBUMIN 2.2 (L) 11/05/2022   BILITOT 7.9 (H) 11/05/2022   ALKPHOS 297 (H) 11/05/2022   AST 107 (H) 11/05/2022   ALT 118 (H) 11/05/2022   ANIONGAP 9 11/05/2022    GFR: Estimated Creatinine Clearance: 68.1 mL/min (by C-G formula based on SCr of 0.47 mg/dL).  Recent Results (from the past 240 hour(s))  Urine Culture (for pregnant, neutropenic or urologic patients or patients with an indwelling urinary catheter)     Status: Abnormal (Preliminary result)   Collection Time: 11/04/22  4:31 AM   Specimen: Urine, Clean Catch  Result Value Ref Range Status   Specimen Description   Final    URINE, CLEAN CATCH Performed at [facility], [address] [place], [place] [address]    Special Requests   Final    NONE Performed at [facility], [address], [place], [place] [address]    Culture (A)  Final    >=100,000 COLONIES/mL ESCHERICHIA COLI SUSCEPTIBILITIES TO FOLLOW Performed at [facility], [address] [place], [place] [address]    Report Status PENDING  Incomplete  Culture, blood (Routine X 2) w Reflex to ID Panel     Status: None (Preliminary result)   Collection Time: 11/04/22 12:10 PM   Specimen: BLOOD  Result Value Ref Range Status   Specimen Description   Final    BLOOD BLOOD  RIGHT ARM AEROBIC BOTTLE ONLY ANAEROBIC BOTTLE ONLY Performed at [facility], [address], [place], [place] [address]    Special Requests   Final    BOTTLES DRAWN AEROBIC AND ANAEROBIC Blood Culture adequate volume Performed at [facility], [address], [place], [place] [address]    Culture   Final    NO GROWTH < 24 HOURS Performed at [facility], [address] [place], [place] [address]    Report Status PENDING  Incomplete  Culture, blood (Routine X 2) w Reflex to ID Panel     Status: None (Preliminary result)   Collection Time: 11/04/22 12:22 PM   Specimen: BLOOD  Result Value Ref Range Status   Specimen Description   Final    BLOOD BLOOD RIGHT ARM AEROBIC BOTTLE ONLY ANAEROBIC BOTTLE ONLY Performed at [facility], [address], [place], [place] [address]    Special Requests   Final    BOTTLES DRAWN AEROBIC AND ANAEROBIC Blood Culture adequate volume Performed at [facility], [address], [place], [place] [address]    Culture   Final    NO GROWTH < 24 HOURS Performed at [facility], [address], [place], [place] [address]    Report Status PENDING  Incomplete      Radiology Studies: DG  Chest Port 1 View  Result Date: 11/04/2022 CLINICAL DATA:  Fever. EXAM: PORTABLE CHEST 1 VIEW COMPARISON:  10/25/2022. FINDINGS: The heart is enlarged and the mediastinal contour is stable. The aortic knob is prominent and unchanged from prior exams. Examination is limited due to patient rotation. Mild airspace disease is present at the lung bases. Linear atelectasis or scarring is noted in the mid right lung. No effusion or pneumothorax. No acute osseous abnormality. IMPRESSION: Mild airspace disease at the lung bases, possible atelectasis or infiltrate. Electronically Signed   By: Thornell Sartorius M.D.   On: 11/04/2022 00:16      LOS: 9 days    Jacquelin Hawking, MD [facility] 11/05/2022, 3:07 PM   If 7PM-7AM, please contact night-coverage [email or web address]

## 2022-11-05 NOTE — Progress Notes (Signed)
IP PROGRESS NOTE  Subjective:   Ms. Rackliff continues to have fever.  No consistent cough.  No pain.  She reports a few "bumps "on the labia.  Her daughter is at the bedside.  Ms. Rees has decided to enroll in home hospice care.  Objective: Vital signs in last 24 hours: Blood pressure 110/63, pulse (!) 59, temperature 98.7 F (37.1 C), temperature source Oral, resp. rate 18, height 5\' 2"  (1.575 m), weight 232 lb (105.2 kg), SpO2 97%.  Intake/Output from previous day: 08/29 0701 - 08/30 0700 In: 1859.7 [P.O.:120; I.V.:1639.7; IV Piggyback:100] Out: 1050 [Urine:1050]  Physical Exam:  HEENT: Scleral icterus, no thrush Abdomen: Soft and nontender, no hepatosplenomegaly, no mass Extremities: No leg edema   Lab Results: Recent Labs    11/04/22 0452  WBC 6.5  HGB 11.0*  HCT 35.1*  PLT 262    BMET Recent Labs    11/04/22 0452 11/05/22 0446  NA 130* 133*  K 3.3* 3.0*  CL 95* 99  CO2 25 25  GLUCOSE 104* 111*  BUN 10 9  CREATININE 0.61 0.47  CALCIUM 8.2* 8.2*    Lab Results  Component Value Date   CAN199 4,527 (H) 10/31/2022    Studies/Results: DG Chest Port 1 View  Result Date: 11/04/2022 CLINICAL DATA:  Fever. EXAM: PORTABLE CHEST 1 VIEW COMPARISON:  10/25/2022. FINDINGS: The heart is enlarged and the mediastinal contour is stable. The aortic knob is prominent and unchanged from prior exams. Examination is limited due to patient rotation. Mild airspace disease is present at the lung bases. Linear atelectasis or scarring is noted in the mid right lung. No effusion or pneumothorax. No acute osseous abnormality. IMPRESSION: Mild airspace disease at the lung bases, possible atelectasis or infiltrate. Electronically Signed   By: Thornell Sartorius M.D.   On: 11/04/2022 00:16    Medications: I have reviewed the patient's current medications.  Assessment/Plan: Cholangiocarcinoma CTs 10/27/2022-ill-defined hypodense porta hepatis mass with encasement and narrowing of  the main portal vein, difficult to separate from the caudate hepatic lobe, but suspected to be of biliary origin, moderate to severe intrahepatic biliary dilation, common bile duct encased and severely narrowed above the pancreas, mass encases the hepatic artery, necrotic porta hepatis nodes and few mildly prominent left aortic nodes, severely distended gallbladder, 2 new 2 mm left upper lobe nodules, enlarging right lower lobe nodule MRI abdomen 10/27/2022-infiltrative mass at the hepatic hilum measuring 4.6 x 4.5 x 3.1 cm causing biliary obstruction with narrowing of the main portal vein, proximal left portal vein, and encasing the common hepatic artery.  Metastatic lymphadenopathy in the upper abdomen and retroperitoneum, 2 tiny liver lesions consistent with intrahepatic metastases, distended gallbladder, simple appearing cyst in the right abdomen ERCP 10/29/2022-single localized common hepatic duct stricture, lignan appearing, brush biopsy formed,, right hepatic duct stent placed, left duct system could not be accessed   2.  Obstructive jaundice/pruritus secondary to #1 3.  Hypertension 4.  History of back surgery 5.  Fever 6.  Microcytosis-chronic   Ms. Kolacz appears unchanged.  She has been diagnosed with cholangiocarcinoma, likely metastatic to abdominal lymph nodes, the liver, and lungs.  She has decided against a trial of systemic therapy.  She prefers home hospice care.  We will make referral to [facility].  I discussed CPR and ACLS with Ms. Fehnel.  She would like to be placed on a no CODE BLUE status.  The etiology of the fever remains unclear, but I suspect a urinary tract infection.  The urine is nitrate positive and culture has returned with gram-negative rods.  Recommendations: [facility] referral for home hospice care Follow-up ID and sensitivity on the gram-negative rod, antibiotics per the medical service No CODE BLUE Please call Oncology as needed.  She is scheduled  for outpatient follow-up at the Cancer center 11/10/2022.   LOS: 9 days   Thornton Papas, MD   11/05/2022, 8:32 AM

## 2022-11-05 NOTE — TOC Transition Note (Signed)
Transition of Care [facility]) - CM/SW Discharge Note   Patient Details  Name: Carolyn Fowler MRN: [id number] Date of Birth: October 19, 1945  Transition of [facility] CM/SW Contact:  Amada Jupiter, LCSW Phone Number: 11/05/2022, 9:34 AM   Clinical Narrative:     Anticipate pt may dc home today with [facility] to provide home hospice services.  Needed DME has been ordered by [facility] for delivery to pt's home.  Family to provide dc transportation.  No further TOC needs.  Final next level of care: Home w Hospice Care Barriers to Discharge: Barriers Resolved   Patient Goals and CMS Choice      Discharge Placement                         Discharge Plan and Services Additional resources added to the After Visit Summary for       Post Acute Care Choice: Hospice                    HH Arranged: RN [facility] Agency: Hospice and Palliative Care of  ([facility]) Date [facility] Agency Contacted: 11/04/22 Time HH Agency Contacted: [phone]    Social Determinants of Health (SDOH) Interventions SDOH Screenings   Food Insecurity: No Food Insecurity (10/28/2022)  Housing: Patient Declined (10/28/2022)  Transportation Needs: No Transportation Needs (10/28/2022)  Utilities: Not At Risk (10/28/2022)  Alcohol Screen: Low Risk  (09/24/2019)  Depression (PHQ2-9): Low Risk  (10/25/2022)  Financial Resource Strain: Low Risk  (01/22/2022)  Physical Activity: Inactive (01/22/2022)  Social Connections: Moderately Isolated (01/22/2022)  Stress: No Stress Concern Present (01/22/2022)  Tobacco Use: Low Risk  (10/29/2022)     Readmission Risk Interventions    10/29/2022   10:17 AM  Readmission Risk Prevention Plan  Post Dischage Appt Complete  Medication Screening Complete  Transportation Screening Complete

## 2022-11-05 NOTE — Plan of Care (Signed)
  Problem: Education: Goal: Knowledge of General Education information will improve Description: Including pain rating scale, medication(s)/side effects and non-pharmacologic comfort measures Outcome: Adequate for Discharge   Problem: Health Behavior/Discharge Planning: Goal: Ability to manage health-related needs will improve Outcome: Adequate for Discharge   Problem: Clinical Measurements: Goal: Respiratory complications will improve Outcome: Completed/Met Goal: Cardiovascular complication will be avoided Outcome: Completed/Met

## 2022-11-05 NOTE — Progress Notes (Signed)
Daily Progress Note   Patient Name: Carolyn Fowler       Date: 11/05/2022 DOB: February 03, 1946  Age: 77 y.o. MRN#: [id number] Attending Physician: Narda Bonds, MD Primary Care Physician: Myrlene Broker, MD Admit Date: 10/27/2022  Reason for Consultation/Follow-up: Establishing goals of care  Patient Profile/HPI: 77 y.o. female  with past medical history of HTN admitted on 10/27/2022 with weight loss and elevated LFTs, progressively worsening jaundice, weakness- sent to ED by primary d/t elevated LFTs. CT revealed ill defined hypodense porta hepatic mass 5.8x4.7cm, encasement of hepatic portal vein, common bile duct, hepatic artery; partially necrotic hepatic lymph nodes, distended gall bladder. Concerns for cholangiocarcinoma. S/p ERCP with stent placement which has resulted in improvement of LFTs. Biopsy resulted in adenocarcinoma likely cholangiocarcinoma. Palliative consulted due to patient has expressed she is not interested in pursuing chemotherapy or other aggressive interventions. Oncology consulted and had brief discussion with patient and daughter with plans for followup at cancer center.   Subjective: Chart reviewed including labs, progress notes, imaging from this and previous encounters.  Patient awake and alert. Happily visiting with multiple family members- no complaints or questions.  Spoke to daughter outside of room. Answered questions related to hospice.   ROS   Physical Exam Vitals and nursing note reviewed.  Neurological:     Mental Status: She is alert and oriented to person, place, and time.             Vital Signs: BP (!) 122/92 (BP Location: Right Arm)   Pulse 72   Temp 99.1 F (37.3 C) (Oral)   Resp 18   Ht 5\' 2"  (1.575 m)   Wt 105.2 kg   SpO2 100%    BMI 42.43 kg/m  SpO2: SpO2: 100 % O2 Device: O2 Device: Room Air O2 Flow Rate: O2 Flow Rate (L/min): 2 L/min  Intake/output summary:  Intake/Output Summary (Last 24 hours) at 11/05/2022 1835 Last data filed at 11/05/2022 1800 Gross per 24 hour  Intake 2304.62 ml  Output 2000 ml  Net 304.62 ml   LBM: Last BM Date : 11/03/22 Baseline Weight: Weight: 105.2 kg Most recent weight: Weight: 105.2 kg       Palliative Assessment/Data: PPS: 50%      Patient Active Problem List   Diagnosis Date Noted   Obstructive jaundice  10/29/2022   Hypodense mass of liver 10/27/2022   Jaundice 10/25/2022   RUQ pain 10/25/2022   Cough 02/02/2022   Pre-diabetes 01/22/2022   Numbness and tingling of both feet 01/20/2021   Positive ANA (antinuclear antibody) 07/09/2020   Bilateral hand pain 07/09/2020   Morbid obesity (HCC) 04/23/2016   GERD (gastroesophageal reflux disease) 04/23/2016   Community acquired pneumonia of left lower lobe of lung 10/16/2014   Chronic lower back pain 10/04/2014   Scoliosis 10/04/2014   Liver cyst 06/19/2012   Spinal stenosis of lumbar region at multiple levels 10/27/2011   Routine health maintenance 07/05/2011   Essential hypertension 03/15/2007    Palliative Care Assessment & Plan    Assessment/Recommendations/Plan  Continue current plan D/C home with Hospice On discharge, would recommend scripts for: - Morphine Concentrate 10mg /0.40ml: 5mg  (0.83ml) sublingual every 1 hour as needed for pain or shortness of breath: Disp 30ml - Lorazepam 2mg /ml concentrated solution: 1mg  (0.59ml) sublingual every 4 hours as needed for anxiety: Disp 30ml - Haldol 2mg /ml solution: 0.5mg  (0.70ml) sublingual every 4 hours as needed for agitation or nausea: Disp 30ml     Code Status: DNR  Prognosis:  < 6 months  Discharge Planning: Home with Hospice  Care plan was discussed with patient, family and care team.   Thank you for allowing the Palliative Medicine Team to  assist in the care of this patient.   Greater than 50%  of this time was spent counseling and coordinating care related to the above assessment and plan.  Ocie Bob, AGNP-C Palliative Medicine   Please contact Palliative Medicine Team phone at [phone] for questions and concerns.

## 2022-11-05 NOTE — Care Management Important Message (Signed)
Important Message  Patient Details No IM Letter given discharging With Hospice. Name: Carolyn Fowler MRN: [id number] Date of Birth: Apr 18, 1945   Medicare Important Message Given:  No     Caren Macadam 11/05/2022, 10:22 AM

## 2022-11-06 DIAGNOSIS — Z515 Encounter for palliative care: Secondary | ICD-10-CM

## 2022-11-06 DIAGNOSIS — R160 Hepatomegaly, not elsewhere classified: Secondary | ICD-10-CM | POA: Diagnosis not present

## 2022-11-06 LAB — URINE CULTURE: Culture: >=100000 — AB

## 2022-11-06 LAB — BASIC METABOLIC PANEL
Anion gap: 10 (ref 5–15)
BUN: 8 mg/dL (ref 8–23)
CO2: 25 mmol/L (ref 22–32)
Calcium: 8.5 mg/dL — ABNORMAL LOW (ref 8.9–10.3)
Chloride: 99 mmol/L (ref 98–111)
Creatinine, Ser: 0.44 mg/dL (ref 0.44–1.00)
GFR, Estimated: >60 mL/min (ref >60)
Glucose, Bld: 98 mg/dL (ref 70–99)
Potassium: 3.4 mmol/L — ABNORMAL LOW (ref 3.5–5.1)
Sodium: 134 mmol/L — ABNORMAL LOW (ref 135–145)

## 2022-11-06 MED ORDER — METRONIDAZOLE 500 MG PO TABS: 500.0000 mg | ORAL_TABLET | Freq: Two times a day (BID) | ORAL | 0 refills | Status: AC

## 2022-11-06 MED ORDER — CEFADROXIL 500 MG PO CAPS: 500.0000 mg | ORAL_CAPSULE | Freq: Two times a day (BID) | ORAL | 0 refills | Status: AC

## 2022-11-06 MED ORDER — POTASSIUM CHLORIDE CRYS ER 20 MEQ PO TBCR
40.0000 meq | EXTENDED_RELEASE_TABLET | Freq: Once | ORAL | Status: AC
  Administered 2022-11-06: 40 meq via ORAL
  Filled 2022-11-06: qty 2

## 2022-11-06 MED ORDER — HYDROCODONE-ACETAMINOPHEN 5-325 MG PO TABS: 1.0000 | ORAL_TABLET | ORAL | 0 refills | Status: DC | PRN

## 2022-11-06 NOTE — Discharge Instructions (Addendum)
Carolyn Fowler,  You were in the hospital and found to have a cancer in you abdomen. The decision was made to start hospice care at home. Your hospitalization was complicated by fevers which seem like they were from a urine infection. You will go home with antibiotics. Please follow-up with your primary care physician as needed. It was a pleasure taking care of you and meeting your family.

## 2022-11-06 NOTE — Discharge Summary (Signed)
Physician Discharge Summary   Patient: Carolyn Fowler MRN: [id number] DOB: Jan 10, 1946  Admit date:     10/27/2022  Discharge date: 11/06/22  Discharge Physician: Jacquelin Hawking, MD   PCP: Myrlene Broker, MD   Recommendations at discharge:  PCP follow-up Hospice care at home  Discharge Diagnoses: Principal Problem:   Hypodense mass of liver Active Problems:   Essential hypertension   GERD (gastroesophageal reflux disease)   Obstructive jaundice   Hospice care patient  Resolved Problems:   * No resolved hospital problems. *  Hospital Course: DEMPLE CISSE is a 77 y.o. female with a history of hypertension, left ventricular hypertrophy, chronic low back pain.  Patient presented secondary to progressive itching, jaundice, worsening lethargy and poor appetite.  CT imaging revealed an ill-defined hypodense porta hepatic hypodense mass measuring 5.8 x 4.7 cm with encasement of the hepatic portal veins, hepatic arteries, common bile duct with associated evidence of cholecystitis.  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  GI consulted and performed an ERCP and placement of a right hepatic duct stent.  Hepatic duct brushing revealed adenocarcinoma, likely cholangiocarcinoma.  Oncology consulted.  Patient decided on home with hospice care on discharge. Urine culture significant for E. Coli. No fevers prior to discharge. Home with antibiotics.  Assessment and Plan:  Hepatobiliary adenocarcinoma Resulting biliary obstruction.  Gastroenterology was consulted and performed ERCP on 8/23 with placement of right hepatic duct stent.  Hepatic duct brushings revealed evidence of adenocarcinoma with concern for possible cholangiocarcinoma.  Medical oncology was consulted with discussions leaning more towards comfort measures/palliative care.  Palliative care is consulted.  Obstruction improved with stent placement with LFTs appearing to have stabilized.  GI recommending consideration  of PTC by IR at some point if necessary. Plan for home with hospice when ready to discharge.   Biliary obstruction Symptoms have improved since stent placement.   Complicated UTI E. Coli identified on urine culture. Associated fevers. Patient was treated empirically on Ceftriaxone and transitioned to cefadroxil on discharge for a total of 7 days.   Primary hypertension Patient is on carvedilol, amlodipine, and valsartan as an outpatient.  Hypokalemia Potassium supplementation given.   GERD Continue Pepcid.   Hypokalemia Mild.  Resolved with potassium supplementation.   Morbid obesity Estimated body mass index is 42.43 kg/m as calculated from the following:   Height as of this encounter: 5\' 2"  (1.575 m).   Weight as of this encounter: 105.2 kg.   Consultants:  [facility] Medical oncology Palliative care medicine   Procedures:  8/23: ERCP  Disposition: Hospice care Diet recommendation: Regular diet   DISCHARGE MEDICATION: Allergies as of 11/06/2022       Reactions   Penicillins Itching, Swelling, Rash, Other (See Comments)   "Itched inside and out"   Egg-derived Products Nausea And Vomiting   Tetracyclines & Related Nausea And Vomiting        Medication List     STOP taking these medications    nystatin-triamcinolone ointment Commonly known as: MYCOLOG       TAKE these medications    acetaminophen 500 MG tablet Commonly known as: TYLENOL Take 500 mg by mouth every 6 (six) hours as needed for mild pain or headache.   amLODipine 5 MG tablet Commonly known as: NORVASC Take 1 tablet (5 mg total) by mouth daily.   aspirin 81 MG tablet Take 81 mg by mouth daily.   carvedilol 25 MG tablet Commonly known as: COREG Take 1 tablet (25 mg total) by mouth 2 (  two) times daily.   cefadroxil 500 MG capsule Commonly known as: DURICEF Take 1 capsule (500 mg total) by mouth 2 (two) times daily for 5 days.   famotidine 20 MG tablet Commonly  known as: PEPCID Take 1 tablet by mouth twice daily   fluticasone 50 MCG/ACT nasal spray Commonly known as: FLONASE Place 2 sprays into both nostrils daily. What changed:  when to take this reasons to take this   hydrochlorothiazide 25 MG tablet Commonly known as: HYDRODIURIL Take 1 tablet (25 mg total) by mouth daily.   HYDROcodone-acetaminophen 5-325 MG tablet Commonly known as: NORCO/VICODIN Take 1 tablet by mouth every 4 (four) hours as needed for severe pain.   hydrOXYzine 10 MG tablet Commonly known as: ATARAX Take 1 tablet (10 mg total) by mouth 3 (three) times daily as needed. What changed: when to take this   metroNIDAZOLE 500 MG tablet Commonly known as: FLAGYL Take 1 tablet (500 mg total) by mouth 2 (two) times daily for 5 days.   multivitamin with minerals Tabs tablet Take 1 tablet by mouth daily.   senna 8.6 MG Tabs tablet Commonly known as: SENOKOT Take 1 tablet by mouth daily as needed for mild constipation.   SYSTANE COMPLETE OP Place 1 drop into both eyes 3 (three) times daily as needed (for irritation).   valsartan 320 MG tablet Commonly known as: Diovan Take 1 tablet (320 mg total) by mouth daily.        Follow-up Information     Myrlene Broker, MD. Schedule an appointment as soon as possible for a visit in 1 week(s).   Specialty: Internal Medicine Why: For hospital follow-up Contact information: [address] [place] [address] [phone]                Discharge Exam: BP 120/81 (BP Location: Right Arm)   Pulse 65   Temp 98.9 F (37.2 C) (Oral)   Resp 18   Ht 5\' 2"  (1.575 m)   Wt 105.2 kg   SpO2 99%   BMI 42.43 kg/m   General exam: Appears calm and comfortable Respiratory system: Respiratory effort normal. Psychiatry: Judgement and insight appear normal. Mood & affect appropriate.   Condition at discharge: stable  The results of significant diagnostics from this hospitalization (including imaging,  microbiology, ancillary and laboratory) are listed below for reference.   Imaging Studies: DG Chest Port 1 View  Result Date: 11/04/2022 CLINICAL DATA:  Fever. EXAM: PORTABLE CHEST 1 VIEW COMPARISON:  10/25/2022. FINDINGS: The heart is enlarged and the mediastinal contour is stable. The aortic knob is prominent and unchanged from prior exams. Examination is limited due to patient rotation. Mild airspace disease is present at the lung bases. Linear atelectasis or scarring is noted in the mid right lung. No effusion or pneumothorax. No acute osseous abnormality. IMPRESSION: Mild airspace disease at the lung bases, possible atelectasis or infiltrate. Electronically Signed   By: Thornell Sartorius M.D.   On: 11/04/2022 00:16   DG ERCP  Result Date: 10/30/2022 CLINICAL DATA:  ERCP for porta hepatis mass. EXAM: ERCP TECHNIQUE: Multiple spot images obtained with the fluoroscopic device and submitted for interpretation post-procedure. FLUOROSCOPY TIME: FLUOROSCOPY TIME 9 minutes, 17 seconds (401 mGy) COMPARISON:  MRCP-10/27/2022 FINDINGS: 9 spot intraoperative fluoroscopic images of the right upper abdominal quadrant during ERCP are provided for review. Initial image demonstrates an ERCP probe overlying the right upper abdominal quadrant. Sequela of previous lower lumbar paraspinal fusion, incompletely evaluated. Subsequent images demonstrate selective  cannulation and opacification of the common bile duct. The common bile duct appears of normal caliber however there appears to be a tapered narrowing at the level of the biliary hilum with associated upstream intrahepatic biliary ductal dilatation. Subsequent images demonstrate placement of an internal biliary stent traversing the location of narrowing at the level of the hilum. IMPRESSION: ERCP with biliary stent placement as above. These images were submitted for radiologic interpretation only. Please see the procedural report for the amount of contrast and the  fluoroscopy time utilized. Electronically Signed   By: Simonne Come M.D.   On: 10/30/2022 09:42   DG C-Arm 1-60 Min-No Report  Result Date: 10/29/2022 Fluoroscopy was utilized by the requesting physician.  No radiographic interpretation.   MR ABDOMEN MRCP W WO CONTAST  Result Date: 10/28/2022 CLINICAL DATA:  77 year old female with history of probable mass in the porta hepatis on prior CT examination concerning for malignancy. Follow-up study. EXAM: MRI ABDOMEN WITHOUT AND WITH CONTRAST (INCLUDING MRCP) TECHNIQUE: Multiplanar multisequence MR imaging of the abdomen was performed both before and after the administration of intravenous contrast. Heavily T2-weighted images of the biliary and pancreatic ducts were obtained, and three-dimensional MRCP images were rendered by post processing. CONTRAST:  10mL GADAVIST GADOBUTROL 1 MMOL/ML IV SOLN COMPARISON:  No prior abdominal MRI. CT of the abdomen and pelvis 10/27/2022. FINDINGS: Lower chest: Unremarkable. Hepatobiliary: In the region of the hepatic hilum (axial image 35 of series 20 and coronal image 38 of series 22 there is a poorly defined infiltrative lesion which is low T1 signal intensity, mildly T2 hyperintense, demonstrates restricted diffusion, and demonstrates heterogeneous enhancement on post gadolinium imaging which appears to increase over time most pronounced on delayed images, highly concerning for probable cholangiocarcinoma. This is estimated to measure approximately 4.6 x 4.5 x 3.1 cm, and encases hilar structures causing severe narrowing of the proximal portal vein which remains patent at this time (best appreciated on axial image 34 of series 20), and complete obliteration of the bile ducts in this region, beyond which the common bile duct is completely decompressed. Severe intrahepatic biliary ductal dilatation is noted. Gallbladder is severely distended. Gallbladder wall appears thickened and edematous with innumerable tiny filling defects  in the gallbladder, indicative of gallstones. Tiny areas of diffusion restriction are noted in the liver in segment 4A (axial image 12 of series 6), and in segment 8 8 (axial image 11 of series 6). The lesion and 4A is not well demonstrated on other pulse sequences, but the lesion in segment 8 is T1 hypointense, T2 hyperintense and appears hypovascular on post gadolinium imaging, concerning for a metastatic lesion. Pancreas: No pancreatic mass. No pancreatic ductal dilatation noted on MRCP images. No peripancreatic fluid collections. Trace amount of T2 signal intensity surrounding the pancreas. Spleen:  Unremarkable. Adrenals/Urinary Tract: Bilateral kidneys and adrenal glands are normal in appearance. No hydroureteronephrosis in the visualized portions of the abdomen. Stomach/Bowel: In the right upper quadrant of the abdomen inferior to the right lobe of the liver (axial image 27 of series 4 and coronal image 19 of series 3) there is a well-defined 5.4 x 7.0 x 4.6 cm T1 hypointense, T2 hyperintense, nonenhancing lesion which appears intimately associated with the ascending colon, benign in appearance, presumably an enteric duplication cyst. Otherwise, unremarkable. Vascular/Lymphatic: No aneurysm identified in the visualized abdominal vasculature. Severe narrowing of the portal vein and proximal left main portal vein secondary to encasement from the previously described mass in the porta hepatis. This mass also comes  in close proximity to the inferior vena cava causing narrowing of the left renal vein which appears distended proximally. Common hepatic artery is encased by the mass. Numerous enlarged lymph nodes are noted in the upper abdomen measuring up to 1.2 cm in the gastrohepatic ligament (axial image 27 of series 20), notable for diffusion restriction, likely metastatic. Enlarged heterogeneously enhancing and diffusion restricting left para-aortic lymph node immediately beneath the left renal hilum (axial  image 51 of series 20) also concerning for metastatic lymph node. Other: No significant volume of ascites noted in the visualized portions of the peritoneal cavity. Musculoskeletal: Extensive susceptibility artifact noted throughout the lumbar spine related to indwelling spinal hardware. IMPRESSION: 1. Large aggressive appearing poorly defined infiltrative mass centered in the hepatic hilum estimated to measure approximately 4.6 x 4.5 x 3.1 cm causing central biliary obstruction resulting in severe intrahepatic biliary ductal dilatation, in addition to severe narrowing of numerous vascular structures, most notable for the main portal vein and proximal left portal vein, in addition to completely encasing the common hepatic artery. This is presumably a cholangiocarcinoma. Metastatic lymphadenopathy is noted in the upper abdomen and retroperitoneum, as above, in addition to 2 tiny lesions in the liver which likely represent intrahepatic metastases. 2. Cholelithiasis with a distended gallbladder with gallbladder wall thickening and edema. These findings may simply be related to intrinsic liver disease, although clinical correlation for signs and symptoms of acute cholecystitis is recommended. 3. 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 4. Additional incidental findings, as above. Electronically Signed   By: Trudie Reed M.D.   On: 10/28/2022 06:25   MR 3D Recon At Scanner  Result Date: 10/28/2022 CLINICAL DATA:  77 year old female with history of probable mass in the porta hepatis on prior CT examination concerning for malignancy. Follow-up study. EXAM: MRI ABDOMEN WITHOUT AND WITH CONTRAST (INCLUDING MRCP) TECHNIQUE: Multiplanar multisequence MR imaging of the abdomen was performed both before and after the administration of intravenous contrast. Heavily T2-weighted images of the biliary and pancreatic ducts were  obtained, and three-dimensional MRCP images were rendered by post processing. CONTRAST:  10mL GADAVIST GADOBUTROL 1 MMOL/ML IV SOLN COMPARISON:  No prior abdominal MRI. CT of the abdomen and pelvis 10/27/2022. FINDINGS: Lower chest: Unremarkable. Hepatobiliary: In the region of the hepatic hilum (axial image 35 of series 20 and coronal image 38 of series 22 there is a poorly defined infiltrative lesion which is low T1 signal intensity, mildly T2 hyperintense, demonstrates restricted diffusion, and demonstrates heterogeneous enhancement on post gadolinium imaging which appears to increase over time most pronounced on delayed images, highly concerning for probable cholangiocarcinoma. This is estimated to measure approximately 4.6 x 4.5 x 3.1 cm, and encases hilar structures causing severe narrowing of the proximal portal vein which remains patent at this time (best appreciated on axial image 34 of series 20), and complete obliteration of the bile ducts in this region, beyond which the common bile duct is completely decompressed. Severe intrahepatic biliary ductal dilatation is noted. Gallbladder is severely distended. Gallbladder wall appears thickened and edematous with innumerable tiny filling defects in the gallbladder, indicative of gallstones. Tiny areas of diffusion restriction are noted in the liver in segment 4A (axial image 12 of series 6), and in segment 8 8 (axial image 11 of series 6). The lesion and 4A is not well demonstrated on other pulse sequences, but the lesion in segment 8 is T1 hypointense, T2 hyperintense and  appears hypovascular on post gadolinium imaging, concerning for a metastatic lesion. Pancreas: No pancreatic mass. No pancreatic ductal dilatation noted on MRCP images. No peripancreatic fluid collections. Trace amount of T2 signal intensity surrounding the pancreas. Spleen:  Unremarkable. Adrenals/Urinary Tract: Bilateral kidneys and adrenal glands are normal in appearance. No  hydroureteronephrosis in the visualized portions of the abdomen. Stomach/Bowel: In the right upper quadrant of the abdomen inferior to the right lobe of the liver (axial image 27 of series 4 and coronal image 19 of series 3) there is a well-defined 5.4 x 7.0 x 4.6 cm T1 hypointense, T2 hyperintense, nonenhancing lesion which appears intimately associated with the ascending colon, benign in appearance, presumably an enteric duplication cyst. Otherwise, unremarkable. Vascular/Lymphatic: No aneurysm identified in the visualized abdominal vasculature. Severe narrowing of the portal vein and proximal left main portal vein secondary to encasement from the previously described mass in the porta hepatis. This mass also comes in close proximity to the inferior vena cava causing narrowing of the left renal vein which appears distended proximally. Common hepatic artery is encased by the mass. Numerous enlarged lymph nodes are noted in the upper abdomen measuring up to 1.2 cm in the gastrohepatic ligament (axial image 27 of series 20), notable for diffusion restriction, likely metastatic. Enlarged heterogeneously enhancing and diffusion restricting left para-aortic lymph node immediately beneath the left renal hilum (axial image 51 of series 20) also concerning for metastatic lymph node. Other: No significant volume of ascites noted in the visualized portions of the peritoneal cavity. Musculoskeletal: Extensive susceptibility artifact noted throughout the lumbar spine related to indwelling spinal hardware. IMPRESSION: 1. Large aggressive appearing poorly defined infiltrative mass centered in the hepatic hilum estimated to measure approximately 4.6 x 4.5 x 3.1 cm causing central biliary obstruction resulting in severe intrahepatic biliary ductal dilatation, in addition to severe narrowing of numerous vascular structures, most notable for the main portal vein and proximal left portal vein, in addition to completely encasing the  common hepatic artery. This is presumably a cholangiocarcinoma. Metastatic lymphadenopathy is noted in the upper abdomen and retroperitoneum, as above, in addition to 2 tiny lesions in the liver which likely represent intrahepatic metastases. 2. Cholelithiasis with a distended gallbladder with gallbladder wall thickening and edema. These findings may simply be related to intrinsic liver disease, although clinical correlation for signs and symptoms of acute cholecystitis is recommended. 3. 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 4. Additional incidental findings, as above. Electronically Signed   By: Trudie Reed M.D.   On: 10/28/2022 06:25   DG Chest 2 View  Result Date: 10/27/2022 CLINICAL DATA:  New onset jaundice and right upper quadrant pain with pruritus. EXAM: CHEST - 2 VIEW COMPARISON:  PA Lat 02/19/2022, chest CT no contrast 03/31/2022 FINDINGS: The heart is enlarged but unchanged. The aorta is tortuous and dilated with scattered calcific plaques, stable mediastinal configuration. No vascular congestion is seen. The lungs are radiographically clear with no substantial pleural effusion. There is osteopenia, kyphosis and degenerative change of the spine with no new osseous findings. IMPRESSION: 1. No evidence of acute chest disease. 2. Stable cardiomegaly and aortic dilatation and atherosclerosis. 3. Osteopenia, kyphosis and degenerative change of the spine. Electronically Signed   By: Almira Bar M.D.   On: 10/27/2022 21:03   CT CHEST ABDOMEN PELVIS W CONTRAST  Result Date: 10/27/2022 CLINICAL DATA:  Metastatic disease evaluation. Patient with hepatic obstruction. Increasing fatigue, weight  loss and poor appetite. EXAM: CT CHEST, ABDOMEN, AND PELVIS WITH CONTRAST TECHNIQUE: Multidetector CT imaging of the chest, abdomen and pelvis was performed following the standard protocol during bolus  administration of intravenous contrast. RADIATION DOSE REDUCTION: This exam was performed according to the departmental dose-optimization program which includes automated exposure control, adjustment of the mA and/or kV according to patient size and/or use of iterative reconstruction technique. CONTRAST:  OMNIPAQUE IOHEXOL 300 MG/ML  SOLN COMPARISON:  PA Lat chest 10/25/2022, chest CT no contrast 03/31/2022. No prior abdomen pelvis CT. FINDINGS: CT CHEST FINDINGS Cardiovascular: There is mild cardiomegaly with heavy calcification in the inferolateral mitral ring. There is no pericardial effusion. Minimal two-vessel coronary calcifications LAD and circumflex. Pulmonary trunk dilatation 4.4 cm is again noted indicating arterial hypertension. No central embolus is seen. Mild prominence of the central pulmonary veins is again noted but unchanged. There is chronic tortuosity and dilatation of the aorta, tortuous and ectatic great vessels exaggerating the superior mediastinum. There are scattered calcifications in the arch and descending aorta. The aortic root is 3.9 cm at the sinuses of Valsalva and again measures 4.3 cm in the ascending segment, 3.8 cm in the arch and descending segments. Mediastinum/Nodes: There are a few bilateral subcentimeter hypodense nodules in the lower poles of the thyroid gland but no nodules requiring imaging follow-up. Axillary spaces are clear. There is no intrathoracic adenopathy. Small hiatal hernia with unremarkable thoracic esophagus, thoracic trachea. Both main bronchi are clear. Lungs/Pleura: Mild chronic elevation right hemidiaphragm. There is left lower lobe medial basal linear scarring associated with mild bronchiectasis. Additional scattered scar-like opacities both bases are again noted and mild subpleural reticulation in the right middle and lower lobe bases. There is a calcified left lower lobe granuloma. There is an 8 mm pleural-based right lower lobe nodule laterally on  6:72 which was previously 4 mm, with linear scarring or atelectasis again extending medially from the nodule towards the hilum. There is a stable 3 mm subpleural nodule in the right middle lobe laterally on 6:74. Not seen previously, 2 nodules each measuring 2 mm are noted in the anterior aspect of the left upper lobe on 6:45 and 47. There is a 2 mm right upper lobe nodule laterally on 6:42 which was seen previously, unchanged. No other new nodules are seen. There are no active infiltrates, no pleural effusion or pneumothorax. Musculoskeletal: There is osteopenia, lower thoracic spine bridging enthesopathy and multilevel degenerative thoracic discs with mild kyphodextroscoliosis. No aggressive regional bone lesion is seen. There are dystrophic calcifications in the anterior left chest wall with no visible chest wall mass. CT ABDOMEN PELVIS FINDINGS Hepatobiliary: Not seen on 03/31/2022 there is an ill-defined hypodense porta hepatis mass, on 2:49 estimated to measure as much as 5.8 x 4.7 cm. The mass encases and severely narrows the hepatic portal main vein, the right hepatic portal vein origin, and the proximal 1 cm of left hepatic portal vein. Faint contrast is still seen in these vessels distal to the narrowing. The mass encases the main hepatic artery and both the proximal left and right hepatic arteries but does not seem to narrow them. The mass is difficult to separate from the overlying caudate hepatic lobe but I suspect is of biliary origin as the common hepatic duct appears to terminate in the mass. There is moderate to severe intrahepatic biliary dilatation above this. The common bile duct is encased and severely narrowed above the level of the pancreas. Except where the caudate hepatic lobe abuts  and is difficult to separate from the underlying mass, the rest of the liver enhances homogeneously without a visible mass. The gallbladder is dilated and thick-walled, distended up to 12 cm in length. This is  probably due to obstructive cholecystitis. The cystic duct is not seen. There is trace pericholecystic fluid. Pancreas: There are enlarged periportal lymph nodes abutting the anterior and posterior head of the pancreas, with the pancreas itself enhances homogeneously without a visible mass and no ductal dilatation or inflammatory change. Spleen: No abnormality.  No splenomegaly. Adrenals/Urinary Tract: Chronic slight nodular thickening both adrenal glands, was also noted on the CTA chest from 09/13/2009 and unchanged. The kidneys enhance homogeneously except for a 1.3 cm homogeneous cyst in the superior pole of the left kidney, Hounsfield density is -2. No follow-up imaging is needed. There is no urinary stone or obstruction. The bladder unremarkable for the degree of distention. Stomach/Bowel: No dilatation or wall thickening. Uncomplicated sigmoid diverticulosis. Vascular/Lymphatic: There is patchy aortoiliac atherosclerosis without AAA. The iliac arteries are tortuous but clear. There are partially necrotic porta hepatis lymph nodes, 1 noted abutting the anterior head of pancreas on 2:50 measuring 3.3 x 1.7 cm, and another abutting the posterior head of pancreas measuring 2.7 x 1.6 cm on the same image. There are few mildly prominent left para-aortic chain nodes up to 1.3 cm in short axis. No pelvic or mesenteric adenopathy. Reproductive: Status post hysterectomy. No adnexal masses. Other: In the left paracolic gutter just inferior to the tip of the liver, there is a unilocular thin walled homogeneous ovoid simple appearing fluid collection measuring 7 x 4.5 x 6.4 cm, Hounsfield density is 23. This abuts and deforms the lateral wall of the mid to distal ascending colon. 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 There are no other  fluid collections. There is no free ascites, free air, or free hemorrhage, and no incarcerated hernia. Musculoskeletal: There is lumbar spinal fusion hardware L3-S1, osteopenia, degenerative changes of the spine and mild lumbar dextroscoliosis. There is advanced L1-2 disc collapse with peridiscal reactive endplate changes and vacuum phenomenon in the disc space. There is severe acquired foraminal stenosis at this level. There is grade 1 L1-2 discogenic retrolisthesis. There is no visible metastatic bone lesion in the imaged volume. IMPRESSION: 1. 5.8 x 4.7 cm ill-defined hypodense porta hepatis mass with encasement and severe narrowing of the main portal vein, right hepatic portal vein origin, and proximal 1 cm of left hepatic portal vein. The mass is difficult to separate from the caudate hepatic lobe but I suspect is of biliary origin as the common hepatic duct appears to terminate in the mass. 2. There is moderate to severe intrahepatic biliary dilatation above the mass, and the common bile duct is encased and severely narrowed above the level of the pancreas. 3. The mass encases the main hepatic artery and both proximal left and right hepatic arteries but does not seem to narrow them. 4. There are partially necrotic porta hepatis lymph nodes and a few mildly prominent left para-aortic chain nodes. 5. Severely distended gallbladder with wall thickening and trace pericholecystic fluid, findings consistent with obstructive cholecystitis. 6. 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 7. 8 mm right lower lobe nodule, previously 4 mm, with 2 new 2 mm nodules in the anterior aspect of the  left upper lobe. 8. Aortic atherosclerosis. Again noted 4.3 cm aneurysmal prominence of the ascending aorta, ectasia in the arch and descending segments with tortuosity. Recommend annual imaging followup by CTA or MRA. This recommendation follows 2010 ACCF/AHA/AATS/ACR/ ASA/SCA/SCAI/SIR/STS/SVM Guidelines for the Diagnosis and Management of Patients with Thoracic Aortic Disease. Circulation. 2010; 121: Z610-R604. Aortic aneurysm NOS (ICD10-I71.9). 9. Cardiomegaly with trace calcific CAD. Apparently chronic prominence of the central veins. 10. Chronic dilatation of the pulmonary trunk. Aortic Atherosclerosis (ICD10-I70.0). Electronically Signed   By: Almira Bar M.D.   On: 10/27/2022 21:00    Microbiology: Results for orders placed or performed during the hospital encounter of 10/27/22  Urine Culture (for pregnant, neutropenic or urologic patients or patients with an indwelling urinary catheter)     Status: Abnormal   Collection Time: 11/04/22  4:31 AM   Specimen: Urine, Clean Catch  Result Value Ref Range Status   Specimen Description   Final    URINE, CLEAN CATCH Performed at [facility], [address], [place], [place] [address]    Special Requests   Final    NONE Performed at [facility], [address] [place], [place] [address]    Culture >=100,000 COLONIES/mL ESCHERICHIA COLI (A)  Final   Report Status 11/06/2022 FINAL  Final   Organism ID, Bacteria ESCHERICHIA COLI (A)  Final      Susceptibility   Escherichia coli - MIC*    AMPICILLIN <=2 SENSITIVE Sensitive     CEFAZOLIN <=4 SENSITIVE Sensitive     CEFEPIME <=0.12 SENSITIVE Sensitive     CEFTRIAXONE <=0.25 SENSITIVE Sensitive     CIPROFLOXACIN <=0.25 SENSITIVE Sensitive     GENTAMICIN <=1 SENSITIVE Sensitive     IMIPENEM <=0.25 SENSITIVE Sensitive     NITROFURANTOIN <=16 SENSITIVE Sensitive     TRIMETH/SULFA <=20 SENSITIVE Sensitive     AMPICILLIN/SULBACTAM <=2 SENSITIVE Sensitive      PIP/TAZO <=4 SENSITIVE Sensitive     * >=100,000 COLONIES/mL ESCHERICHIA COLI  Culture, blood (Routine X 2) w Reflex to ID Panel     Status: None (Preliminary result)   Collection Time: 11/04/22 12:10 PM   Specimen: BLOOD  Result Value Ref Range Status   Specimen Description   Final    BLOOD BLOOD RIGHT ARM AEROBIC BOTTLE ONLY ANAEROBIC BOTTLE ONLY Performed at [facility], [address], [place], [place] [address]    Special Requests   Final    BOTTLES DRAWN AEROBIC AND ANAEROBIC Blood Culture adequate volume Performed at [facility], [address] [place], [place] [address]    Culture   Final    NO GROWTH 2 DAYS Performed at [facility], [address] [place], [place] [address]    Report Status PENDING  Incomplete  Culture, blood (Routine X 2) w Reflex to ID Panel     Status: None (Preliminary result)   Collection Time: 11/04/22 12:22 PM   Specimen: BLOOD  Result Value Ref Range Status   Specimen Description   Final    BLOOD BLOOD RIGHT ARM AEROBIC BOTTLE ONLY ANAEROBIC BOTTLE ONLY Performed at [facility], [address] [place], [place] [address]  Special Requests   Final    BOTTLES DRAWN AEROBIC AND ANAEROBIC Blood Culture adequate volume Performed at [facility], [address], [place], [place] [address]    Culture   Final    NO GROWTH 2 DAYS Performed at [facility], [address], [place], [place] [address]    Report Status PENDING  Incomplete    Labs: CBC: Recent Labs  Lab 10/31/22 1311 11/01/22 0518 11/02/22 0435 11/04/22 0452  WBC 8.0 7.8 7.6 6.5  NEUTROABS 4.7 4.3 4.0 4.2  HGB 11.3* 11.4* 11.5* 11.0*  HCT 34.9* 35.0* 35.7* 35.1*  MCV 72.1* 71.6* 72.1* 74.1*  PLT 257 277 264 262   Basic Metabolic Panel: Recent Labs  Lab 10/31/22 1311 11/01/22 0518 11/02/22 0435 11/03/22 0440 11/04/22 0452 11/05/22 0446  NA 136 132* 134* 138 130* 133*  K  3.1* 3.7 3.7 3.5 3.3* 3.0*  CL 98 97* 98 101 95* 99  CO2 26 27 28 28 25 25   GLUCOSE 92 103* 100* 109* 104* 111*  BUN 15 10 9 8 10 9   CREATININE 0.54 0.69 0.57 0.63 0.61 0.47  CALCIUM 8.4* 8.6* 8.6* 8.9 8.2* 8.2*  MG 2.0 2.0 2.0  --   --   --   PHOS 2.9 3.0  --   --   --   --    Liver Function Tests: Recent Labs  Lab 11/01/22 0518 11/02/22 0435 11/03/22 0440 11/04/22 0452 11/05/22 0446  AST 80* 91* 100* 104* 107*  ALT 118* 110* 113* 116* 118*  ALKPHOS 253* 283* 286* 280* 297*  BILITOT 8.7* 8.1* 8.3* 7.9* 7.9*  PROT 6.4* 6.3* 6.0* 6.3* 6.0*  ALBUMIN 2.7* 2.5* 2.4* 2.5* 2.2*    Discharge time spent: 35 minutes.  Signed: Jacquelin Hawking, MD [facility] 11/06/2022

## 2022-11-06 NOTE — Progress Notes (Signed)
Mobility Specialist - Progress Note   11/06/22 0858  Mobility  Activity Ambulated with assistance in hallway  Level of Assistance Standby assist, set-up cues, supervision of patient - no hands on  Assistive Device Front wheel walker  Distance Ambulated (ft) 250 ft  Activity Response Tolerated well  Mobility Referral Yes  $Mobility charge 1 Mobility  Mobility Specialist Start Time (ACUTE ONLY) [id number]  Mobility Specialist Stop Time (ACUTE ONLY) 0858  Mobility Specialist Time Calculation (min) (ACUTE ONLY) 22 min   Pt received EOB and agreeable to mobility. No complaints during session. Pt to bed after session with all needs met.    [facility]

## 2022-11-06 NOTE — TOC Progression Note (Signed)
Transition of Care [facility]) - Progression Note    Patient Details  Name: ZYMIRAH TRUPP MRN: [id number] Date of Birth: [birth year]  [facility]) CM/SW Contact  Adrian Prows, RN Phone Number: 11/06/2022, 2:11 PM  Clinical Narrative:    Sherron Monday w/ pt's dtr Abelino Derrick; she verified equipment has been delivered by [facility]; no TOC needs   Expected Discharge Plan: Home w Hospice Care Barriers to Discharge: Barriers Resolved  Expected Discharge Plan and Services     Post Acute Care Choice: Hospice Living arrangements for the past 2 months: Single Family Home Expected Discharge Date: 11/06/22                         HH Arranged: RN [facility] Agency: [facility] ([facility]) Date [facility] Agency Contacted: 11/04/22 Time HH Agency Contacted: [phone]     Social Determinants of Health (SDOH) Interventions SDOH Screenings   Food Insecurity: No Food Insecurity (10/28/2022)  Housing: Patient Declined (10/28/2022)  Transportation Needs: No Transportation Needs (10/28/2022)  Utilities: Not At Risk (10/28/2022)  Alcohol Screen: Low Risk  (09/24/2019)  Depression (PHQ2-9): Low Risk  (10/25/2022)  Financial Resource Strain: Low Risk  (01/22/2022)  Physical Activity: Inactive (01/22/2022)  Social Connections: Moderately Isolated (01/22/2022)  Stress: No Stress Concern Present (01/22/2022)  Tobacco Use: Low Risk  (10/29/2022)    Readmission Risk Interventions    10/29/2022   10:17 AM  Readmission Risk Prevention Plan  Post Dischage Appt Complete  Medication Screening Complete  Transportation Screening Complete

## 2022-11-09 LAB — CULTURE, BLOOD (ROUTINE X 2)
Culture: NO GROWTH
Culture: NO GROWTH
Special Requests: ADEQUATE
Special Requests: ADEQUATE

## 2022-11-10 ENCOUNTER — Inpatient Hospital Stay: Attending: Oncology | Admitting: Oncology

## 2022-11-10 VITALS — BP 127/77 | HR 63 | Temp 98.2°F | Resp 18 | Ht 62.0 in | Wt 229.0 lb

## 2022-11-10 DIAGNOSIS — R718 Other abnormality of red blood cells: Secondary | ICD-10-CM | POA: Diagnosis not present

## 2022-11-10 DIAGNOSIS — R160 Hepatomegaly, not elsewhere classified: Secondary | ICD-10-CM | POA: Diagnosis not present

## 2022-11-10 DIAGNOSIS — C221 Intrahepatic bile duct carcinoma: Secondary | ICD-10-CM | POA: Diagnosis not present

## 2022-11-10 DIAGNOSIS — L299 Pruritus, unspecified: Secondary | ICD-10-CM | POA: Insufficient documentation

## 2022-11-10 MED ORDER — FAMOTIDINE 20 MG PO TABS: 20.0000 mg | ORAL_TABLET | Freq: Two times a day (BID) | ORAL | 1 refills | Status: DC

## 2022-11-10 NOTE — Progress Notes (Signed)
[facility] OFFICE PROGRESS NOTE   Diagnosis: Cholangiocarcinoma  INTERVAL HISTORY:   [name] was discharged from hospital 11/06/2022.  She was diagnosed with an E. coli urinary tract infection and is completing a course of outpatient antibiotics.  She reports 1 episode of fever since discharge from the hospital.  No pain.  Pruritus remains improved.  She continues to have dark urine.  She has enrolled in home hospice care.  Objective:  Vital signs in last 24 hours:  Blood pressure 127/77, pulse 63, temperature 98.2 F (36.8 C), temperature source Oral, resp. rate 18, height 5\' 2"  (1.575 m), weight 229 lb (103.9 kg), SpO2 98%.    HEENT: Scleral and oral icterus Resp: Lungs clear bilaterally Cardio: Regular rate and rhythm GI: Nontender, no mass, no hepatosplenomegaly Vascular: No leg edema   Lab Results:  Lab Results  Component Value Date   WBC 6.5 11/04/2022   HGB 11.0 (L) 11/04/2022   HCT 35.1 (L) 11/04/2022   MCV 74.1 (L) 11/04/2022   PLT 262 11/04/2022   NEUTROABS 4.2 11/04/2022    CMP  Lab Results  Component Value Date   NA 134 (L) 11/06/2022   K 3.4 (L) 11/06/2022   CL 99 11/06/2022   CO2 25 11/06/2022   GLUCOSE 98 11/06/2022   BUN 8 11/06/2022   CREATININE 0.44 11/06/2022   CALCIUM 8.5 (L) 11/06/2022   PROT 6.0 (L) 11/05/2022   ALBUMIN 2.2 (L) 11/05/2022   AST 107 (H) 11/05/2022   ALT 118 (H) 11/05/2022   ALKPHOS 297 (H) 11/05/2022   BILITOT 7.9 (H) 11/05/2022   GFRNONAA >60 11/06/2022   GFRAA >90 10/21/2011    Lab Results  Component Value Date   ZOX096 4,527 (H) 10/31/2022      Medications: I have reviewed the patient's current medications.   Assessment/Plan:    Cholangiocarcinoma CTs 10/27/2022-ill-defined hypodense porta hepatis mass with encasement and narrowing of the main portal vein, difficult to separate from the caudate hepatic lobe, but suspected to be of biliary origin, moderate to severe intrahepatic  biliary dilation, common bile duct encased and severely narrowed above the pancreas, mass encases the hepatic artery, necrotic porta hepatis nodes and few mildly prominent left aortic nodes, severely distended gallbladder, 2 new 2 mm left upper lobe nodules, enlarging right lower lobe nodule MRI abdomen 10/27/2022-infiltrative mass at the hepatic hilum measuring 4.6 x 4.5 x 3.1 cm causing biliary obstruction with narrowing of the main portal vein, proximal left portal vein, and encasing the common hepatic artery.  Metastatic lymphadenopathy in the upper abdomen and retroperitoneum, 2 tiny liver lesions consistent with intrahepatic metastases, distended gallbladder, simple appearing cyst in the right abdomen ERCP 10/29/2022-single localized common hepatic duct stricture, malignant appearing, brush biopsy performed-malignant cells present consistent with adenocarcinoma, right hepatic duct stent placed, left duct system could not be accessed   2.  Obstructive jaundice/pruritus secondary to #1 3.  Hypertension 4.  History of back surgery 5.  Fever during hospital admission August 2024-diagnosed with an E. coli urinary tract infection 6.  Microcytosis-chronic  [name] has been diagnosed with adenocarcinoma involving a bile duct brushing.  The clinical presentation is most consistent with a diagnosis of metastatic cholangiocarcinoma.  She understands the possibility of metastatic disease from another tumor site with malignant adenopathy causing the bile duct obstruction.  She reconfirmed her decision to not pursue systemic therapy with chemotherapy, immunotherapy, or biologic therapy.  She underwent placement of a palliative right hepatic duct stent on 10/29/2022.  The pruritus has improved.  Jaundice has partially improved.  We discussed the indication for an interventional radiology referral to consider external drainage of the left hepatic duct system.  She does not wish to undergo this  procedure.  [name] has enrolled in hospice care.  She will call for pain or a fever.  She will return for an office visit in 1 month.  She confirmed her decision for a no CODE BLUE status. Thornton Papas, MD  11/10/2022  2:00 PM

## 2022-11-26 ENCOUNTER — Ambulatory Visit: Payer: Medicare PPO | Admitting: Gastroenterology

## 2022-12-06 ENCOUNTER — Telehealth: Payer: Self-pay

## 2022-12-06 NOTE — Telephone Encounter (Signed)
[facility] confirms the passing of the patient on November 23, 2022.

## 2022-12-07 ENCOUNTER — Inpatient Hospital Stay: Payer: Medicare PPO

## 2022-12-07 ENCOUNTER — Inpatient Hospital Stay: Payer: Medicare PPO | Admitting: Genetic Counselor

## 2022-12-07 DEATH — deceased

## 2022-12-16 ENCOUNTER — Inpatient Hospital Stay: Payer: Medicare PPO | Admitting: Oncology

## 2023-01-24 ENCOUNTER — Encounter: Payer: Medicare PPO | Admitting: Internal Medicine
